# Patient Record
Sex: Female | Born: 1980
Health system: Southern US, Community
[De-identification: ages and names within clinical notes are randomized; demographics above are authoritative.]

## PROBLEM LIST (undated history)

## (undated) DIAGNOSIS — A449 Bartonellosis, unspecified: Secondary | ICD-10-CM

## (undated) DIAGNOSIS — B6000 Babesiosis, unspecified: Secondary | ICD-10-CM

## (undated) DIAGNOSIS — R760 Raised antibody titer: Secondary | ICD-10-CM

## (undated) DIAGNOSIS — E282 Polycystic ovarian syndrome: Secondary | ICD-10-CM

## (undated) DIAGNOSIS — A6920 Lyme disease, unspecified: Secondary | ICD-10-CM

## (undated) DIAGNOSIS — R002 Palpitations: Secondary | ICD-10-CM

## (undated) DIAGNOSIS — G35 Multiple sclerosis: Secondary | ICD-10-CM

## (undated) DIAGNOSIS — E785 Hyperlipidemia, unspecified: Secondary | ICD-10-CM

## (undated) DIAGNOSIS — Z124 Encounter for screening for malignant neoplasm of cervix: Secondary | ICD-10-CM

## (undated) DIAGNOSIS — Z87442 Personal history of urinary calculi: Secondary | ICD-10-CM

## (undated) DIAGNOSIS — N200 Calculus of kidney: Secondary | ICD-10-CM

## (undated) HISTORY — PX: LITHOTRIPSY: SUR834

## (undated) HISTORY — DX: Babesiosis, unspecified: B60.00

## (undated) HISTORY — DX: Raised antibody titer: R76.0

## (undated) HISTORY — DX: Encounter for screening for malignant neoplasm of cervix: Z12.4

## (undated) HISTORY — DX: Multiple sclerosis: G35

## (undated) HISTORY — DX: Bartonellosis, unspecified: A44.9

## (undated) HISTORY — PX: TONSILLECTOMY: SUR1361

## (undated) HISTORY — DX: Polycystic ovarian syndrome: E28.2

## (undated) HISTORY — DX: Palpitations: R00.2

## (undated) HISTORY — DX: Calculus of kidney: N20.0

## (undated) HISTORY — DX: Hyperlipidemia, unspecified: E78.5

---

## 2009-08-13 ENCOUNTER — Encounter: Payer: Self-pay | Admitting: Cardiovascular Disease

## 2009-08-13 LAB — CONVERTED CEMR LAB
ALT: 19 units/L
AST: 19 units/L
Chloride: 99 meq/L
Glucose, Bld: 83 mg/dL
HDL: 43 mg/dL
Hemoglobin: 14.8 g/dL
LDL (calc): 156 mg/dL
Lymphocytes, automated: 2.7 %
MCV: 87 fL
Monocytes Relative: 0.5 %
Potassium: 3.8 meq/L
RDW: 13 %
TSH: 1.79 microintl units/mL
Triglyceride fasting, serum: 126 mg/dL

## 2009-08-19 ENCOUNTER — Ambulatory Visit: Payer: Self-pay | Admitting: Cardiovascular Disease

## 2009-08-19 DIAGNOSIS — E785 Hyperlipidemia, unspecified: Secondary | ICD-10-CM

## 2009-08-19 DIAGNOSIS — R002 Palpitations: Secondary | ICD-10-CM

## 2009-08-19 DIAGNOSIS — R011 Cardiac murmur, unspecified: Secondary | ICD-10-CM | POA: Insufficient documentation

## 2009-08-19 DIAGNOSIS — E7849 Other hyperlipidemia: Secondary | ICD-10-CM | POA: Insufficient documentation

## 2009-08-19 DIAGNOSIS — R42 Dizziness and giddiness: Secondary | ICD-10-CM

## 2009-08-19 DIAGNOSIS — R519 Headache, unspecified: Secondary | ICD-10-CM | POA: Insufficient documentation

## 2009-08-19 DIAGNOSIS — R51 Headache: Secondary | ICD-10-CM | POA: Insufficient documentation

## 2009-08-20 ENCOUNTER — Encounter: Payer: Self-pay | Admitting: Cardiovascular Disease

## 2009-08-26 ENCOUNTER — Encounter: Payer: Self-pay | Admitting: Cardiovascular Disease

## 2009-08-28 ENCOUNTER — Telehealth: Payer: Self-pay | Admitting: Cardiovascular Disease

## 2009-09-22 ENCOUNTER — Ambulatory Visit: Payer: Self-pay | Admitting: Internal Medicine

## 2010-07-12 DIAGNOSIS — Z124 Encounter for screening for malignant neoplasm of cervix: Secondary | ICD-10-CM

## 2010-07-12 HISTORY — DX: Encounter for screening for malignant neoplasm of cervix: Z12.4

## 2010-08-02 LAB — HM PAP SMEAR: HM Pap smear: NORMAL

## 2010-08-11 NOTE — Progress Notes (Signed)
Summary: PHI  PHI   Imported By: Harlon Flor 08/26/2009 10:57:37  _____________________________________________________________________  External Attachment:    Type:   Image     Comment:   External Document

## 2010-08-11 NOTE — Assessment & Plan Note (Signed)
Summary: NEW PT; PALPITATIONS   Visit Type:  new patient Primary Provider:  Ronna Polio, MD  CC:  palpitations, no sob, no chest pains, and no swelling.  History of Present Illness: [name] is a very pleasant 30 year old woman with a history of hyperlipidemia and recent history of palpitations and skipping who presents for new patient evaluation.  Ms. Febo states that over the past several weeks to months she has had episodes of extra beats, sometimes with fluttering and skipping in her chest. She was seen by Dr. Harl Bowie and had a EKG and echocardiogram at that time. Per her report, EKG was normal and the echocardiogram showed minimal mitral regurgitation. She was started on atenolol 25 mg the dose was increased to 50 mg daily. She took medications for approximately 2 weeks and then stopped the medications. she is uncertain if the medications helped.   Since she stopped taking the atenolol, she's had worsening fluttering, feeling of hiccups and palpitations. These occur at rest as well as with exercise. They are more at nighttime than during the daytime. Sometimes they happen for prolonged periods of time and are constant. She states that last week was a bad week for her fluttering.  Preventive Screening-Counseling & Management  Alcohol-Tobacco     Alcohol type: wine     Smoking Status: never  Caffeine-Diet-Exercise     Caffeine use/day: 1-2 cups per day     Does Patient Exercise: yes     Type of exercise: cardio     Times/week: 3-5 days a week  Current Problems (verified): 1)  Dizziness, Chronic  (ICD-780.4) 2)  Headache  (ICD-784.0) 3)  Murmur  (ICD-785.2)  Current Medications (verified): 1)  None  Allergies (verified): No Known Drug Allergies  Past History:  Family History: Last updated: 08/19/2009 Father: Living and healthy Mother: Living and healthy Siblings: Living and healthy  Maternal grandmother- heart problems has had bypass surgery  Social  History: 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  Alcohol Use - yes socially maybe 3 glasses of wine a month   Risk Factors: Caffeine Use: 1-2 cups per day (08/19/2009) Exercise: yes (08/19/2009)  Risk Factors: Smoking Status: never (08/19/2009)  Past Surgical History: C- Section 2008  Family History: Father: Living and healthy Mother: Living and healthy Siblings: Living and healthy  Maternal grandmother- heart problems has had bypass surgery  Social History: Full Time- RN Single  Tobacco Use - No.  Alcohol Use - yes socially maybe 3 glasses of wine a month  Smoking Status:  never Caffeine use/day:  1-2 cups per day Does Patient Exercise:  yes  Review of Systems  The patient denies anorexia, fever, weight loss, weight gain, vision loss, decreased hearing, hoarseness, chest pain, syncope, dyspnea on exertion, peripheral edema, prolonged cough, headaches, hemoptysis, abdominal pain, melena, hematochezia, severe indigestion/heartburn, hematuria, incontinence, genital sores, muscle weakness, suspicious skin lesions, transient blindness, difficulty walking, depression, unusual weight change, abnormal bleeding, enlarged lymph nodes, angioedema, breast masses, and testicular masses.         Palpitations, fluttering  Vital Signs:  Patient profile:   30 year old female Height:      65 inches Weight:      170.75 pounds BMI:     28.52 Pulse rate:   82 / minute Pulse rhythm:   regular BP sitting:   124 / 80  (left arm) Cuff size:   regular  Vitals Entered By: Mercer Pod (August 19, 2009 9:51  AM)  Physical Exam  General:  well-appearing woman in no apparent distress. H. EENT exam is benign, oropharynx is clear. Neck is supple with no JVP, no carotid bruit. Heart sounds are regular with S1-S2 and no murmurs appreciated. lungs are clear to auscultation with no wheezes or rales. Abdominal exam is benign. No significant lower extremity edema.  Pulses are equal and symmetrical in her upper and lower extremities. Skin is warm and dry. Neurologic exam is nonfocal.   Problems:  Medical Problems Added: 1)  Dx of Hyperlipidemia-mixed  (ICD-272.4) 2)  Dx of Palpitations  (ICD-785.1) 3)  Dx of Dizziness, Chronic  (ICD-780.4) 4)  Dx of Headache  (ICD-784.0) 5)  Dx of Murmur  (ICD-785.2)   EKG  Procedure date:  08/19/2009  Findings:      EKG shows normal sinus rhythm wit No significant ST or T wave changes.  rate of 82 beats per minute. Normal.  Impression & Recommendations:  Problem # 1:  PALPITATIONS (ICD-785.1) etiology of her palpitations is likely due to APCs or VPCs. Her ectopy is a rare and not on a continuous basis making other arrhythmias less likely. We did discuss with her that we could do a event monitor though I suspect this is ectopy. We have given her some samples of bystolic  5 mg daily that she can take on an as needed basis. I Have also given her a prescription for metoprolol tartrate 0.5 mg p.o. b.i.d. if she finds the bystolic too expensive.  Her updated medication list for this problem includes:    Metoprolol Tartrate 25 Mg Tabs (Metoprolol tartrate) .Marland Kitchen... Take one tablet by mouth twice a day and as needed for palpitations    Bystolic 5 Mg Tabs (Nebivolol hcl) .Marland Kitchen... 1 tab daily as needed for palpitations  Problem # 2:  HYPERLIPIDEMIA-MIXED (ICD-272.4) Ms. Ayllon has a family history of high cholesterol. We'll obtain her most recent cholesterol panel from Dr. Dan Humphreys. She will try to watch her diet and increase her exercise over the next several months with a recheck of her cholesterol is he notes indicate her total cholesterol is greater than 200.  Problem # 3:  MURMUR (ICD-785.2) I will try to obtain the echocardiogram from Dr. Harl Bowie for our records. Per report, she has mild mitral regurgitation. Her updated medication list for this problem includes:    Metoprolol Tartrate 25 Mg Tabs (Metoprolol tartrate)  .Marland Kitchen... Take one tablet by mouth twice a day and as needed for palpitations    Bystolic 5 Mg Tabs (Nebivolol hcl) .Marland Kitchen... 1 tab daily as needed for palpitations  Patient Instructions: 1)  Your physician recommends that you schedule a follow-up appointment in: 6 months 2)  Your physician has recommended you make the following change in your medication: metoprolol 25 mg twice a day, bystolic 5 mg as needed Prescriptions: METOPROLOL TARTRATE 25 MG TABS (METOPROLOL TARTRATE) Take one tablet by mouth twice a day and as needed for palpitations  #60 x 6   Entered by:   Charlena Cross, RN, BSN   Authorized by:   Dossie Arbour MD   Signed by:   Charlena Cross, RN, BSN on 08/19/2009   Method used:   Electronically to        [facility]. [address] [phone]* (retail)       [address] [place], [place]  [id number]       Ph: [phone]       Fax: [phone]  RxID:   [id number]

## 2010-08-11 NOTE — Assessment & Plan Note (Signed)
Summary: np6   Visit Type:  New EP  Referring Provider:  Mariah Milling Primary Provider:  Ronna Polio, MD  CC:  palpitations is intermentant-comes and goes.  No sob and no edema.. Not taking the medication that was RX to her. .  History of Present Illness: Shannon Davidson returns today for followup.  She is a 30 yo woman with a h/o palpitations and preserved LV function.  She states that her episodes began about 6 months ago.  She notes that they can come on at any time and are associated with dizziness.  No c/p, sob, or night sweats. No syncope.  No family h/o sudden death or other arrhythmia.  She notes that caffiene may contribute to her problems and she has tried to cut back.  Current Problems (verified): 1)  Hyperlipidemia-mixed  (ICD-272.4) 2)  Palpitations  (ICD-785.1) 3)  Dizziness, Chronic  (ICD-780.4) 4)  Headache  (ICD-784.0) 5)  Murmur  (ICD-785.2)  Current Medications (verified): 1)  Metoprolol Tartrate 25 Mg Tabs (Metoprolol Tartrate) .... Take One Tablet By Mouth Twice A Day and As Needed For Palpitations -Stopped 2)  Bystolic 5 Mg Tabs (Nebivolol Hcl) .Marland Kitchen.. 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  Alcohol Use - yes socially maybe 3 glasses of wine a month   Risk Factors: Smoking Status: never (08/19/2009)  Review of Systems       All systems reviewed and negative except as noted in the HPI.  Vital Signs:  Patient profile:   30 year old female Height:      65 inches Weight:       165.75 pounds BMI:     27.68 Pulse rate:   72 / minute Pulse rhythm:   regular BP sitting:   114 / 74  (left arm) Cuff size:   regular  Vitals Entered By: Mercer Pod (September 22, 2009 12:07 PM)  Physical Exam  General:  well-appearing woman in no apparent distress. H. EENT exam is benign, oropharynx is clear. Neck is supple with no JVP, no carotid bruit. Heart sounds are regular with S1-S2 and no murmurs appreciated. lungs are clear to auscultation with no wheezes or rales. Abdominal exam is benign. No significant lower extremity edema. Pulses are equal and symmetrical in her upper and lower extremities. Skin is warm and dry. Neurologic exam is nonfocal.   EKG  Procedure date:  08/16/2009  Findings:      Normal sinus rhythm with rate of:76.  PAC's noted.    Impression & Recommendations:  Problem # 1:  PALPITATIONS (ICD-785.1) Her symptoms appear to be for the most part due to PAC's.  She may also have PVC's. I have discussed the treatment options with the patient in detail and she will continue to try as needed beta blockers.  If this does not control her symptoms then I would consider a calcium channel blocker like verapamil or cardizem.  Another third line treatment would be flecainide.  Also, avoidance of caffiene and ETOH and assuring a good night of sleep are helpful in controlling her symptoms.  Finally the benign  nature have been outlined and reassured. Her updated medication list for this problem includes:    Metoprolol Tartrate 25 Mg Tabs (Metoprolol tartrate) .Marland Kitchen... Take one tablet by mouth twice a day and as needed for palpitations -stopped    Bystolic 5 Mg Tabs (Nebivolol hcl) .Marland Kitchen... 1 tab daily as needed for palpitations - stopped  Problem # 2:  HYPERLIPIDEMIA-MIXED (ICD-272.4) A low fat diet is recommended.

## 2010-08-11 NOTE — Progress Notes (Signed)
Summary: still having PVC's on beta blockers  Phone Note Call from Patient   Summary of Call: Pt started out taking bystolic and metroprolol separately for palps.  Did not feel that PRN was working.  Pt hassince  been taking bystolic and metoprolol daily.  Obtained 12 lead EKG at work while feeling irregular HR- showed freq. PVC's- will fax results tomorrow.  Medications have not helped- have only made her feel washed out and has no energy for normal daily activities (working out, etc).  Pts BP reported lower than usual but pt unable to give specific readings.  Stated that on bystolic, HR would run as low as 50's.   Initial call taken by: Charlena Cross, RN, BSN,  August 28, 2009 1:26 PM  Follow-up for Phone Call        per Dr. Mariah Milling- pt should be taking one beta blocker or the other, not both.  PVC's are benign but irritating.  Pt can set up appt wtih Dr. Graciela Husbands if symptoms become too irritating.   Follow-up by: Charlena Cross, RN, BSN,  August 29, 2009 9:28 AM  Additional Follow-up for Phone Call Additional follow up Details #1::        pt set up to see Dr. Ladona Ridgel March 14 Additional Follow-up by: Charlena Cross, RN, BSN,  August 29, 2009 3:11 PM

## 2010-08-20 ENCOUNTER — Inpatient Hospital Stay (INDEPENDENT_AMBULATORY_CARE_PROVIDER_SITE_OTHER)
Admission: RE | Admit: 2010-08-20 | Discharge: 2010-08-20 | Disposition: A | Discharge status: Home / Self Care | Payer: Commercial Managed Care - PPO | Source: Ambulatory Visit | Attending: Family Medicine | Admitting: Family Medicine

## 2010-08-20 DIAGNOSIS — J069 Acute upper respiratory infection, unspecified: Secondary | ICD-10-CM

## 2010-08-20 LAB — POCT RAPID STREP A (OFFICE): Streptococcus, Group A Screen (Direct): NEGATIVE

## 2010-11-27 ENCOUNTER — Other Ambulatory Visit: Payer: Self-pay | Admitting: Internal Medicine

## 2011-02-02 ENCOUNTER — Encounter: Payer: Self-pay | Admitting: Cardiovascular Disease

## 2011-04-07 ENCOUNTER — Encounter: Payer: Self-pay | Admitting: Internal Medicine

## 2011-04-07 ENCOUNTER — Ambulatory Visit (INDEPENDENT_AMBULATORY_CARE_PROVIDER_SITE_OTHER): Payer: Commercial Managed Care - PPO | Admitting: Internal Medicine

## 2011-04-07 VITALS — BP 130/83 | HR 96 | Temp 98.5°F | Resp 20 | Ht 65.0 in | Wt 167.0 lb

## 2011-04-07 DIAGNOSIS — M25519 Pain in unspecified shoulder: Secondary | ICD-10-CM

## 2011-04-07 DIAGNOSIS — M25512 Pain in left shoulder: Secondary | ICD-10-CM

## 2011-04-07 DIAGNOSIS — E663 Overweight: Secondary | ICD-10-CM

## 2011-04-07 MED ORDER — TRAMADOL HCL 50 MG PO TABS: 50.0000 mg | ORAL_TABLET | Freq: Four times a day (QID) | ORAL | Status: AC | PRN

## 2011-04-07 MED ORDER — PHENTERMINE HCL 37.5 MG PO CAPS: 37.5000 mg | ORAL_CAPSULE | ORAL | Status: DC

## 2011-04-07 NOTE — Progress Notes (Signed)
Subjective:    Patient ID: Shannon Davidson, female    DOB: 1981-04-04, 30 y.o.   MRN: [id number]  HPI Shannon Davidson is a 30 year old female who presents for followup. She reports that she was recently evaluated by her OB/GYN who diagnosed her with PCO S. She reports her physician sent her for glucose testing which show glucose intolerance. Her physician put her on phentermine in an effort to help her lose weight. She reports she has been taking this for approximately 3 months. She's had no known side effects from this medication. She reports an almost 10 pound weight loss using the medication. She has been monitoring her diet closely but has not been counting calories. She has been trying to increase her physical activity.  She also complains of left shoulder pain. She reports that this left shoulder and scapular pain has been present for several months. She is currently under the care of a chiropractor who has been performing ultrasound therapy with some improvement in symptoms. She takes ibuprofen as needed for pain but feels that this is not adequate.  Outpatient Encounter Prescriptions as of 04/07/2011  Medication Sig Dispense Refill  . phentermine (ADIPEX-P) 37.5 MG capsule Take 1 capsule (37.5 mg total) by mouth every morning.  30 capsule  3    Review of Systems  Constitutional: Negative for fever, chills, appetite change, fatigue and unexpected weight change.  HENT: Negative for ear pain, congestion, sore throat, trouble swallowing, neck pain, voice change and sinus pressure.   Eyes: Negative for visual disturbance.  Respiratory: Negative for cough, shortness of breath, wheezing and stridor.   Cardiovascular: Negative for chest pain, palpitations and leg swelling.  Gastrointestinal: Negative for nausea, vomiting, abdominal pain, diarrhea, constipation, blood in stool, abdominal distention and anal bleeding.  Genitourinary: Negative for dysuria and flank pain.  Musculoskeletal: Positive for  myalgias and arthralgias. Negative for gait problem.  Skin: Negative for color change and rash.  Neurological: Negative for dizziness and headaches.  Hematological: Negative for adenopathy. Does not bruise/bleed easily.  Psychiatric/Behavioral: Negative for suicidal ideas, sleep disturbance and dysphoric mood. The patient is not nervous/anxious.    BP 130/83  Pulse 96  Temp(Src) 98.5 F (36.9 C) (Oral)  Resp 20  Ht 5\' 5"  (1.651 m)  Wt 167 lb (75.751 kg)  BMI 27.79 kg/m2  SpO2 97%  LMP 02/10/2011     Objective:   Physical Exam  Constitutional: She is oriented to person, place, and time. She appears well-developed and well-nourished. No distress.  HENT:  Head: Normocephalic and atraumatic.  Right Ear: External ear normal.  Left Ear: External ear normal.  Nose: Nose normal.  Mouth/Throat: Oropharynx is clear and moist. No oropharyngeal exudate.  Eyes: Conjunctivae are normal. Pupils are equal, round, and reactive to light. Right eye exhibits no discharge. Left eye exhibits no discharge. No scleral icterus.  Neck: Normal range of motion. Neck supple. No tracheal deviation present. No thyromegaly present.  Cardiovascular: Normal rate, regular rhythm, normal heart sounds and intact distal pulses.  Exam reveals no gallop and no friction rub.   No murmur heard. Pulmonary/Chest: Effort normal and breath sounds normal. No respiratory distress. She has no wheezes. She has no rales. She exhibits no tenderness.  Musculoskeletal: Normal range of motion. She exhibits no edema and no tenderness.       Left shoulder: She exhibits tenderness and pain. She exhibits normal strength.  Lymphadenopathy:    She has no cervical adenopathy.  Neurological: She is alert and oriented to  person, place, and time. No cranial nerve deficit. She exhibits normal muscle tone. Coordination normal.  Skin: Skin is warm and dry. No rash noted. She is not diaphoretic. No erythema. No pallor.  Psychiatric: She has a  normal mood and affect. Her behavior is normal. Judgment and thought content normal.          Assessment & Plan:  1. Overweight - patient is overweight with BMI of 27. This is an improvement with a 10 pound weight loss. She appears to be doing quite well using phentermine. We discussed the use of a food diary, however she feels this would be overwhelming given her other responsibilities at this time. Encouraged her to try to select healthy foods, specifically whole foods such as fruits and vegetables and avoid processed foods to help lower her caloric intake. We will plan to continue the phentermine for another one to 3 months given that she has done so well on this medication. She will followup in one month. Our goal be for her to lose 1-2 pounds per week.  2. Left shoulder pain - patient of left shoulder and scapular pain. She is currently followed by a chiropractor who is performing ultrasound therapy with some benefit. She will plan to continue with this. We have added tramadol for her to use for severe pain as needed.

## 2011-04-07 NOTE — Patient Instructions (Signed)
Calorie Counting Diet A calorie counting diet requires you to eat the number of calories that are right for you during a day. Calories are the measurement of how much energy you get from the food you eat. Eating the right amount of calories is important for staying at a healthy weight. If you eat too many calories your body will store them as fat and you may gain weight. If you eat too few calories you may lose weight. Counting the number of calories that you eat during a day will help you to know if you're eating the right amount. A Registered Dietitian can determine how many calories you need in a day. The amount of calories you need varies from person to person. If your goal is to lose weight you will need to eat fewer calories. Losing weight can benefit you if you are overweight or have health problems such as heart disease, high blood pressure or diabetes. If your goal is to gain weight, you will need to eat more calories. Gaining weight may be necessary if you have a certain health problem that causes your body to need more energy. TIPS Whether you are increasing or decreasing the number of calories you eat during a day, it may be hard to get used to changing what you eat and drink. The following are tips to help you keep track of the number of calories you are eating.  Measuring foods at home with measuring cups will help you to know the actual amount of food and number of calories you are eating.   Restaurants serve food in all different portion sizes. It is common that restaurants will serve food in amounts worth 2 or more serving sizes. While eating out, it may be helpful to estimate how many servings of a food you are given. For example, a serving of cooked rice is 1/2 cup and that is the size of half of a fist. Knowing serving sizes will help you have a better idea of how much food you are eating at restaurants.   Ask for smaller portion sizes or child-size portions at restaurants.   Plan to  eat half of a meal at a restaurant and take the rest home or share the other half with a friend   Read food labels for calorie content and serving size   Most packaged food has a Nutrition Facts Panel on its side or back. Here you can find out how many servings are in a package, the size of a serving, and the number of calories each serving has.   The serving size and number of servings per container are listed right below the Nutrition Facts heading. Just below the serving information, the number of calories in each serving is listed.   For example, say that a package has three cookies inside. The Nutrition Facts panel says that one serving is one cookie. Below that, it says that there are three servings in the container. The calories section of the Nutrition Facts says there are 90 calories. That means that there are 90 calories in one cookie. If you eat one cookie you have eaten 90 calories. If you eat all three cookies, you have eaten three times that amount, or 270 calories.  The list below tells you how big or small some common portion sizes are.  1 ounce (oz).................4 stacked dice.   3 oz..............................Deck of cards.   1 teaspoon (tsp)...........Tip of little finger.   1 tablespoon (Tbsp)....Tip of thumb.     2 Tbsp..........................Golf ball.    Cup..........................Half of a fist.   1 Cup...........................A fist.  KEEP A FOOD LOG Write down every food item that you eat, how much of the food you eat, and the number of calories in each food that you eat during the day. At the end of the day or throughout the day you can add up the total number of calories you have eaten.  It may help to set up a list like the one below. Find out the calorie information by reading food labels.  Breakfast   Bran Flakes (1 cup, 110 calories).   Fat free milk ( cup, 45 calories).   Snack   Apple (1 medium, 80 calories).   Lunch   Spinach (1  cup, 20 calories).   Tomato ( medium, 20 calories).   Chicken breast strips (3 oz, 165 calories).   Shredded cheddar cheese ( cup, 110 calories).   Light Italian dressing (2 Tbsp, 60 calories).   Whole wheat bread (1 slice, 80 calories).   Tub margarine (1 tsp, 35 calories).   Vegetable soup (1 cup, 160 calories).   Dinner   Pork chop (3 oz, 190 calories).   Brown rice (1 cup, 215 calories).   Steamed broccoli ( cup, 20 calories).   Strawberries (1  cup, 65 calories).   Whipped cream (1 Tbsp, 50 calories).  Daily Calorie Total: 1425 Information from www.eatright.org, Foodwise Nutritional Analysis Database. Document Released: 06/28/2005 Document Re-Released: 07/20/2009 [employer] Patient Information 2011 [employer]. 

## 2011-05-07 ENCOUNTER — Encounter: Payer: Self-pay | Admitting: Internal Medicine

## 2011-05-07 ENCOUNTER — Ambulatory Visit (INDEPENDENT_AMBULATORY_CARE_PROVIDER_SITE_OTHER): Payer: Commercial Managed Care - PPO | Admitting: Internal Medicine

## 2011-05-07 VITALS — BP 107/74 | HR 86 | Temp 98.6°F | Resp 20 | Ht 65.0 in | Wt 164.8 lb

## 2011-05-07 DIAGNOSIS — E663 Overweight: Secondary | ICD-10-CM

## 2011-05-07 DIAGNOSIS — M25519 Pain in unspecified shoulder: Secondary | ICD-10-CM

## 2011-05-07 DIAGNOSIS — M25512 Pain in left shoulder: Secondary | ICD-10-CM

## 2011-05-07 MED ORDER — PHENTERMINE HCL 37.5 MG PO TABS: 37.5000 mg | ORAL_TABLET | Freq: Every day | ORAL | Status: DC

## 2011-05-07 MED ORDER — CYCLOBENZAPRINE HCL 5 MG PO TABS: 5.0000 mg | ORAL_TABLET | Freq: Three times a day (TID) | ORAL | Status: DC | PRN

## 2011-05-07 NOTE — Patient Instructions (Signed)
Start Flexeril as needed for pain. Follow up in 1 month.

## 2011-05-07 NOTE — Progress Notes (Signed)
Subjective:    Patient ID: Shannon Davidson, female    DOB: [birth year], 30 y.o.   MRN: [id number]  HPI 30 year old female presents for followup. She has 2 concerns today. First she was started on phentermine at her last visit to help with appetite suppression and weight loss. She has lost 3 pounds since her last visit. She reports improvement in her diet with healthier food choices. She also notes that she has increased her physical activity and is participating in a running program with the goal of running a 5K race next month. She denies any problems with the phentermine. She denies any palpitations, chest pain, or agitation.  She continues to have left shoulder and upper back pain, which has been persistent for several months. She has been followed by a chiropractor and undergone massage therapy. She has had minimal improvement with therapy. Her chiropractor has discussed referral to a pain clinic. He also had discussed trying a course of muscle relaxers. She is interested in trying a muscle relaxer to help with pain. She denies any weakness in her left arm or numbness in her left arm. She has been using tramadol and nonsteroidals with no improvement.  Outpatient Encounter Prescriptions as of 05/07/2011  Medication Sig Dispense Refill  . TRAMADOL HCL PO Take 1 tablet by mouth as needed.        Marland Kitchen DISCONTD: phentermine (ADIPEX-P) 37.5 MG capsule Take 1 capsule (37.5 mg total) by mouth every morning.  30 capsule  3  . cyclobenzaprine (FLEXERIL) 5 MG tablet Take 1 tablet (5 mg total) by mouth every 8 (eight) hours as needed for muscle spasms. May increase to 2 tablets as needed.  30 tablet  1  . phentermine (ADIPEX-P) 37.5 MG tablet Take 1 tablet (37.5 mg total) by mouth daily before breakfast.  30 tablet  2    Review of Systems  Constitutional: Negative for fever, chills, appetite change, fatigue and unexpected weight change.  HENT: Negative for ear pain and neck pain.   Eyes: Negative for visual  disturbance.  Respiratory: Negative for cough and shortness of breath.   Cardiovascular: Negative for chest pain and palpitations.  Musculoskeletal: Positive for myalgias, back pain and arthralgias. Negative for gait problem.  Skin: Negative for color change and rash.  Neurological: Negative for dizziness and headaches.  Hematological: Negative for adenopathy. Does not bruise/bleed easily.  Psychiatric/Behavioral: Negative for suicidal ideas, sleep disturbance and dysphoric mood. The patient is not nervous/anxious.    BP 107/74  Pulse 86  Temp(Src) 98.6 F (37 C) (Oral)  Resp 20  Ht 5\' 5"  (1.651 m)  Wt 164 lb 12 oz (74.73 kg)  BMI 27.42 kg/m2  SpO2 97%     Objective:   Physical Exam  Constitutional: She is oriented to person, place, and time. She appears well-developed and well-nourished. No distress.  HENT:  Head: Normocephalic and atraumatic.  Right Ear: External ear normal.  Left Ear: External ear normal.  Nose: Nose normal.  Mouth/Throat: Oropharynx is clear and moist. No oropharyngeal exudate.  Eyes: Conjunctivae are normal. Pupils are equal, round, and reactive to light. Right eye exhibits no discharge. Left eye exhibits no discharge. No scleral icterus.  Neck: Normal range of motion. Neck supple. No tracheal deviation present. No thyromegaly present.  Cardiovascular: Normal rate, regular rhythm, normal heart sounds and intact distal pulses.  Exam reveals no gallop and no friction rub.   No murmur heard. Pulmonary/Chest: Effort normal and breath sounds normal. No respiratory distress. She has no  wheezes. She has no rales.   She exhibits no tenderness.  Musculoskeletal: Normal range of motion. She exhibits no edema and no tenderness.       Left shoulder: She exhibits tenderness and pain. She exhibits normal range of motion, no swelling and normal strength.  Lymphadenopathy:    She has no cervical adenopathy.  Neurological: She is alert and oriented to person, place, and  time. No cranial nerve deficit. She exhibits normal muscle tone. Coordination normal.  Skin: Skin is warm and dry. No rash noted. She is not diaphoretic. No erythema. No pallor.  Psychiatric: She has a normal mood and affect. Her behavior is normal. Judgment and thought content normal.          Assessment & Plan:  1. Left shoulder/upper back pain - Will try course of flexeril to see if any improvement.  She will use this only at night given its potential to make her drowsy.  She will continue with therapy through her chiropractor, Dr. Alfredo Bach.  She will follow up or call if   2. Overweight -Congratulated pt on 3lb weight loss over the last month.  Encouraged her to continue with healthy diet and regular exercise including her running program. She will follow up in 1 month for BP and weight check. We had discussed checking HgbA1c in the past given concern about diabetes, and will check today.

## 2011-05-14 ENCOUNTER — Ambulatory Visit (INDEPENDENT_AMBULATORY_CARE_PROVIDER_SITE_OTHER): Payer: 59 | Admitting: *Deleted

## 2011-05-14 ENCOUNTER — Other Ambulatory Visit: Payer: Self-pay

## 2011-05-14 DIAGNOSIS — Z131 Encounter for screening for diabetes mellitus: Secondary | ICD-10-CM

## 2011-06-08 ENCOUNTER — Ambulatory Visit (INDEPENDENT_AMBULATORY_CARE_PROVIDER_SITE_OTHER): Payer: 59 | Admitting: Internal Medicine

## 2011-06-08 ENCOUNTER — Encounter: Payer: Self-pay | Admitting: Internal Medicine

## 2011-06-08 VITALS — BP 136/80 | HR 101 | Temp 98.4°F | Wt 164.0 lb

## 2011-06-08 DIAGNOSIS — M549 Dorsalgia, unspecified: Secondary | ICD-10-CM | POA: Insufficient documentation

## 2011-06-08 DIAGNOSIS — Z6825 Body mass index (BMI) 25.0-25.9, adult: Secondary | ICD-10-CM

## 2011-06-08 DIAGNOSIS — E663 Overweight: Secondary | ICD-10-CM | POA: Insufficient documentation

## 2011-06-08 MED ORDER — GABAPENTIN 100 MG PO CAPS: 100.0000 mg | ORAL_CAPSULE | Freq: Three times a day (TID) | ORAL | Status: DC

## 2011-06-08 NOTE — Progress Notes (Signed)
Subjective:    Patient ID: Shannon Davidson, female    DOB: 1981-06-18, 30 y.o.   MRN: [id number]  HPI 30YO female presents to follow up on weight loss and chronic left upper back pain. In regards to weight loss, pt notes a plateau over last few weeks. Notes some dietary indiscretion with Thanksgiving Holiday.  No palpitations, agitation, or elevated BP (pt checks BP at work) noted.  In regards to left upper back pain, pt notes no improvement with use of flexeril. Continues to have burning pain that is worse when active and moving left arm and worse when lying down at night. Has tried NSAIDs, chiropractic care with no improvement.  Would like to consider steroid injection. Denies weakness in left arm or left shoulder.  Outpatient Encounter Prescriptions as of 06/08/2011  Medication Sig Dispense Refill  . phentermine 37.5 MG capsule Take 37.5 mg by mouth every morning.        Marland Kitchen DISCONTD: cyclobenzaprine (FLEXERIL) 5 MG tablet Take 1 tablet (5 mg total) by mouth every 8 (eight) hours as needed for muscle spasms. May increase to 2 tablets as needed.  30 tablet  1  . gabapentin (NEURONTIN) 100 MG capsule Take 1 capsule (100 mg total) by mouth 3 (three) times daily.  90 capsule  2  . DISCONTD: TRAMADOL HCL PO Take 1 tablet by mouth as needed.          Review of Systems  Constitutional: Negative for fever, chills, appetite change, fatigue and unexpected weight change.  Eyes: Negative for visual disturbance.  Respiratory: Negative for cough and shortness of breath.   Cardiovascular: Negative for chest pain, palpitations and leg swelling.  Genitourinary: Negative for dysuria and flank pain.  Musculoskeletal: Positive for myalgias, back pain and arthralgias. Negative for gait problem.  Skin: Negative for color change.  Neurological: Negative for dizziness and headaches.  Hematological: Negative for adenopathy. Does not bruise/bleed easily.  Psychiatric/Behavioral: Negative for dysphoric mood. The  patient is not nervous/anxious.    BP 136/80  Pulse 101  Temp(Src) 98.4 F (36.9 C) (Oral)  Wt 164 lb (74.39 kg)  SpO2 97%     Objective:   Physical Exam  Constitutional: She is oriented to person, place, and time. She appears well-developed and well-nourished. No distress.  HENT:  Head: Normocephalic and atraumatic.  Right Ear: External ear normal.  Left Ear: External ear normal.  Nose: Nose normal.  Mouth/Throat: Oropharynx is clear and moist. No oropharyngeal exudate.  Eyes: Conjunctivae are normal. Pupils are equal, round, and reactive to light. Right eye exhibits no discharge. Left eye exhibits no discharge. No scleral icterus.  Neck: Normal range of motion. Neck supple. No tracheal deviation present. No thyromegaly present.  Cardiovascular: Normal rate, regular rhythm, normal heart sounds and intact distal pulses.  Exam reveals no gallop and no friction rub.   No murmur heard. Pulmonary/Chest: Effort normal and breath sounds normal. No respiratory distress. She has no wheezes. She has no rales. She exhibits no tenderness.  Musculoskeletal: Normal range of motion. She exhibits no edema and no tenderness.       Arms: Lymphadenopathy:    She has no cervical adenopathy.  Neurological: She is alert and oriented to person, place, and time. No cranial nerve deficit. She exhibits normal muscle tone. Coordination normal.  Skin: Skin is warm and dry. No rash noted. She is not diaphoretic. No erythema. No pallor.  Psychiatric: She has a normal mood and affect. Her behavior is normal. Judgment and thought  content normal.          Assessment & Plan:  1. Left upper back pain - Pt has failed conservative therapy with NSAIDS, muscle relaxants and chiropractic care. Question if she would benefit from local steroid injection. Will set her up at the pain clinic for evaluation. In the interim, will start Neurontin 100mg  at nighttime, with plan to advance to tid as tolerated for pain. Follow  up 3 months.  2. Overweight - No change in weight over last month. Pt continues to use phentermine, notes some dietary indescretion over Thanksgiving Holiday. Will continue to monitor weight. Pt will monitor BP at work. She will stop phentermine if BP >140/90. Goal weight loss 1 lb per week to goal BMI 25.

## 2011-08-03 ENCOUNTER — Ambulatory Visit: Payer: Self-pay

## 2011-08-05 LAB — BETA STREP CULTURE(ARMC)

## 2011-08-06 ENCOUNTER — Ambulatory Visit: Payer: 59 | Admitting: Internal Medicine

## 2011-08-24 ENCOUNTER — Telehealth: Payer: Self-pay | Admitting: Internal Medicine

## 2011-08-24 NOTE — Telephone Encounter (Signed)
Patient is needing a note that she has had a physical for her insurance at work.

## 2011-08-25 NOTE — Telephone Encounter (Signed)
She wants to know if she has had a physical, if not she is willing to schedule b/c she is required to have one and proof given to her employer. I do not see a CPE in any of the OV's, do you?

## 2011-08-26 NOTE — Telephone Encounter (Signed)
I don't think we have done a physical.  She may have had PAP with OB? Fine to schedule

## 2011-08-26 NOTE — Telephone Encounter (Signed)
Can you please help inform pt that she has not had a physical and go ahead and schedule pt w/Dr Dan Humphreys? THANKS!

## 2011-08-30 NOTE — Telephone Encounter (Signed)
OV on 2/28 is 15 min. OK for this to be CPX? Or do you want [place] to reschedule?

## 2011-08-30 NOTE — Telephone Encounter (Signed)
Fine to leave it

## 2011-08-30 NOTE — Telephone Encounter (Signed)
Called bmp her last cpx was 08/14/09 she has a 3 month  follow up  On 09/09/11

## 2011-09-01 NOTE — Telephone Encounter (Signed)
I left pt Vm that she should keep upcoming apt and to call w/any questions. I have form that she faxed over that requires [place] to put date of physical

## 2011-09-09 ENCOUNTER — Ambulatory Visit (INDEPENDENT_AMBULATORY_CARE_PROVIDER_SITE_OTHER): Payer: 59 | Admitting: Internal Medicine

## 2011-09-09 ENCOUNTER — Encounter: Payer: Self-pay | Admitting: Internal Medicine

## 2011-09-09 DIAGNOSIS — E282 Polycystic ovarian syndrome: Secondary | ICD-10-CM | POA: Insufficient documentation

## 2011-09-09 DIAGNOSIS — Z0000 Encounter for general adult medical examination without abnormal findings: Secondary | ICD-10-CM | POA: Insufficient documentation

## 2011-09-09 NOTE — Assessment & Plan Note (Signed)
Will set up an endocrinology evaluation. Patient has had testing in the past including hemoglobin A1c, but question if she might benefit from additional testing including 2 hour glucose challenge, and testing for CAH.

## 2011-09-09 NOTE — Assessment & Plan Note (Signed)
Exam is normal today. She has scheduled followup with her OB/GYN for Pap smear and breast exam. Will obtain vaccination record from [facility] health. We'll plan to recheck lipids with labs, however will defer until she sees endocrinologist to get all labs in one setting. Followup in one year.

## 2011-09-09 NOTE — Progress Notes (Signed)
Subjective:    Patient ID: Shannon Davidson, female    DOB: March 07, 1981, 31 y.o.   MRN: [id number]  HPI 31 year old female presents for her annual exam. She reports that she is generally feeling well. She notes that she recently read a book about PCO S., and she would like to pursue further evaluation for her PCO S. She reports she was diagnosed with this in the past, but has never seen an endocrinologist for a more thorough evaluation. She continues to have irregular periods. She occasionally takes progesterone 2 stimulate menses. She last used progesterone the last month, but reports only minimal spotting after using this medication. She denies any abdominal pain or distention. Given her history of PCO S., she is also concerned about the risk of diabetes. In the past, she had hemoglobin A1c which was normal at 5.4%. However, she questions whether the use of metformin might be helpful to prevent diabetes in the future.  Aside from her concerns about PCO S., she reports that she is feeling well. She is very active and has recently changed jobs.  In regards to health maintenance, she notes that she had a Pap smear last year with her OB/GYN and is scheduled to see her OB/GYN again this year for both Pap smear and breast exam. She is up-to-date on her vaccinations. She had lab work including cholesterol testing last year.  Outpatient Encounter Prescriptions as of 09/09/2011  Medication Sig Dispense Refill  . phentermine 37.5 MG capsule Take 37.5 mg by mouth every morning.        Marland Kitchen DISCONTD: gabapentin (NEURONTIN) 100 MG capsule Take 1 capsule (100 mg total) by mouth 3 (three) times daily.  90 capsule  2    Review of Systems  Constitutional: Negative for fever, chills, appetite change, fatigue and unexpected weight change.  HENT: Negative for ear pain, congestion, sore throat, trouble swallowing, neck pain, voice change and sinus pressure.   Eyes: Negative for visual disturbance.  Respiratory: Negative  for cough, shortness of breath, wheezing and stridor.   Cardiovascular: Negative for chest pain, palpitations and leg swelling.  Gastrointestinal: Negative for nausea, vomiting, abdominal pain, diarrhea, constipation, blood in stool, abdominal distention and anal bleeding.  Genitourinary: Positive for menstrual problem. Negative for dysuria and flank pain.  Musculoskeletal: Negative for myalgias, arthralgias and gait problem.  Skin: Negative for color change and rash.  Neurological: Negative for dizziness and headaches.  Hematological: Negative for adenopathy. Does not bruise/bleed easily.  Psychiatric/Behavioral: Negative for suicidal ideas, sleep disturbance and dysphoric mood. The patient is not nervous/anxious.    BP 112/68  Pulse 99  Temp(Src) 98.7 F (37.1 C) (Oral)  Ht 5' 5.5" (1.664 m)  Wt 161 lb (73.029 kg)  BMI 26.38 kg/m2  SpO2 100%  LMP 07/09/2011     Objective:   Physical Exam  Constitutional: She is oriented to person, place, and time. She appears well-developed and well-nourished. No distress.  HENT:  Head: Normocephalic and atraumatic.  Right Ear: External ear normal.  Left Ear: External ear normal.  Nose: Nose normal.  Mouth/Throat: Oropharynx is clear and moist. No oropharyngeal exudate.  Eyes: Conjunctivae are normal. Pupils are equal, round, and reactive to light. Right eye exhibits no discharge. Left eye exhibits no discharge. No scleral icterus.  Neck: Normal range of motion. Neck supple. No tracheal deviation present. No thyromegaly present.  Cardiovascular: Normal rate, regular rhythm, normal heart sounds and intact distal pulses.  Exam reveals no gallop and no friction rub.   No  murmur heard. Pulmonary/Chest: Effort normal and breath sounds normal. No respiratory distress. She has no wheezes. She has no rales. She exhibits no tenderness.  Abdominal: Soft. Bowel sounds are normal. She exhibits no distension and no mass. There is no tenderness. There is no  rebound and no guarding.  Musculoskeletal: Normal range of motion. She exhibits no edema and no tenderness.  Lymphadenopathy:    She has no cervical adenopathy.  Neurological: She is alert and oriented to person, place, and time. No cranial nerve deficit. She exhibits normal muscle tone. Coordination normal.  Skin: Skin is warm and dry. No rash noted. She is not diaphoretic. No erythema. No pallor.  Psychiatric: She has a normal mood and affect. Her behavior is normal. Judgment and thought content normal.          Assessment & Plan:

## 2012-05-16 ENCOUNTER — Telehealth: Payer: Self-pay

## 2012-05-16 NOTE — Telephone Encounter (Signed)
I don't see that she has used xanax while a patient at our current office. I think that she used it when we were at our previous office. I will call in a short supply but she will need follow up appointment in the next 2 weeks. Alprazolam 0.25mg  po tid prn #20 no refill.

## 2012-05-16 NOTE — Telephone Encounter (Signed)
Pt is needing refill on Xanax. She is going through a bad custody battle and needs something for her nerves. She uses [facility] on [address].

## 2012-05-18 ENCOUNTER — Telehealth: Payer: Self-pay

## 2012-05-18 NOTE — Telephone Encounter (Signed)
That is fine 

## 2012-05-18 NOTE — Telephone Encounter (Signed)
Pt called and was scheduled in early December for her CPE but needs a form for work filled out before 06/10/12. You had room on 11/2//13 at 2:30pm I was wondering if this would be ok she is a [employer].

## 2012-05-19 MED ORDER — ALPRAZOLAM 0.25 MG PO TABS: 0.2500 mg | ORAL_TABLET | Freq: Three times a day (TID) | ORAL | Status: DC

## 2012-05-19 NOTE — Telephone Encounter (Signed)
Left message on cell phone voicemail for patient to return call. 

## 2012-05-19 NOTE — Telephone Encounter (Signed)
Patient advised as instructed via telephone, she has an appt with Dr. Dan Humphreys on 06/02/2012.  Rx for Alprazolam called to [facility].

## 2012-06-02 ENCOUNTER — Encounter: Payer: Self-pay | Admitting: Internal Medicine

## 2012-06-02 ENCOUNTER — Ambulatory Visit (INDEPENDENT_AMBULATORY_CARE_PROVIDER_SITE_OTHER): Payer: Self-pay | Admitting: Internal Medicine

## 2012-06-02 VITALS — BP 118/78 | HR 76 | Temp 98.9°F | Resp 16 | Ht 65.5 in | Wt 157.2 lb

## 2012-06-02 DIAGNOSIS — Z0000 Encounter for general adult medical examination without abnormal findings: Secondary | ICD-10-CM | POA: Insufficient documentation

## 2012-06-02 NOTE — Progress Notes (Signed)
  Subjective:    Patient ID: Shannon Davidson, female    DOB: 11-17-1980, 31 y.o.   MRN: [id number]  HPI 31YO female presents for annual exam. Doing well. No concerns today. Follows healthy diet and gets regular physical activity.  UTD on health maintenance including PAP, breast exam, immunizations.  Outpatient Encounter Prescriptions as of 06/02/2012  Medication Sig Dispense Refill  . ALPRAZolam (XANAX) 0.25 MG tablet Take 1 tablet (0.25 mg total) by mouth 3 (three) times daily.  20 tablet  0  . valACYclovir (VALTREX) 500 MG tablet        BP 118/78  Pulse 76  Temp 98.9 F (37.2 C) (Oral)  Resp 16  Ht 5' 5.5" (1.664 m)  Wt 157 lb 4 oz (71.328 kg)  BMI 25.77 kg/m2  LMP 05/19/2012  Review of Systems  Constitutional: Negative for fever, chills, appetite change, fatigue and unexpected weight change.  HENT: Negative for ear pain, congestion, sore throat, trouble swallowing, neck pain, voice change and sinus pressure.   Eyes: Negative for visual disturbance.  Respiratory: Negative for cough, shortness of breath, wheezing and stridor.   Cardiovascular: Negative for chest pain, palpitations and leg swelling.  Gastrointestinal: Negative for nausea, vomiting, abdominal pain, diarrhea, constipation, blood in stool, abdominal distention and anal bleeding.  Genitourinary: Negative for dysuria and flank pain.  Musculoskeletal: Negative for myalgias, arthralgias and gait problem.  Skin: Negative for color change and rash.  Neurological: Negative for dizziness and headaches.  Hematological: Negative for adenopathy. Does not bruise/bleed easily.  Psychiatric/Behavioral: Negative for suicidal ideas, sleep disturbance and dysphoric mood. The patient is not nervous/anxious.        Objective:   Physical Exam  Constitutional: She is oriented to person, place, and time. She appears well-developed and well-nourished. No distress.  HENT:  Head: Normocephalic and atraumatic.  Right Ear: External ear  normal.  Left Ear: External ear normal.  Nose: Nose normal.  Mouth/Throat: Oropharynx is clear and moist. No oropharyngeal exudate.  Eyes: Conjunctivae normal are normal. Pupils are equal, round, and reactive to light. Right eye exhibits no discharge. Left eye exhibits no discharge. No scleral icterus.  Neck: Normal range of motion. Neck supple. No tracheal deviation present. No thyromegaly present.  Cardiovascular: Normal rate, regular rhythm, normal heart sounds and intact distal pulses.  Exam reveals no gallop and no friction rub.   No murmur heard. Pulmonary/Chest: Effort normal and breath sounds normal. No respiratory distress. She has no wheezes. She has no rales. She exhibits no tenderness.  Abdominal: Soft. Bowel sounds are normal. She exhibits no distension and no mass. There is no tenderness. There is no rebound and no guarding.  Musculoskeletal: Normal range of motion. She exhibits no edema and no tenderness.  Lymphadenopathy:    She has no cervical adenopathy.  Neurological: She is alert and oriented to person, place, and time. No cranial nerve deficit. She exhibits normal muscle tone. Coordination normal.  Skin: Skin is warm and dry. No rash noted. She is not diaphoretic. No erythema. No pallor.  Psychiatric: She has a normal mood and affect. Her behavior is normal. Judgment and thought content normal.          Assessment & Plan:

## 2012-06-02 NOTE — Assessment & Plan Note (Signed)
General medical exam is normal today. PAP deferred as UTD 07/2010, will plan to repeat 07/2013.  Other health maintenance including vaccinations UTD.  Encouraged continued efforts at healthy diet and regular physical activity. Will plan to repeat basic labs through her work, at [facility]. She will fax over results.

## 2012-06-21 ENCOUNTER — Encounter: Payer: 59 | Admitting: Internal Medicine

## 2012-08-21 ENCOUNTER — Encounter: Payer: Self-pay | Admitting: Internal Medicine

## 2012-09-07 ENCOUNTER — Ambulatory Visit: Payer: BC Managed Care – PPO | Admitting: Internal Medicine

## 2012-09-07 ENCOUNTER — Ambulatory Visit (INDEPENDENT_AMBULATORY_CARE_PROVIDER_SITE_OTHER): Payer: BC Managed Care – PPO | Admitting: Internal Medicine

## 2012-09-07 ENCOUNTER — Encounter: Payer: Self-pay | Admitting: Internal Medicine

## 2012-09-07 VITALS — BP 120/88 | HR 62 | Temp 98.9°F | Wt 162.0 lb

## 2012-09-07 DIAGNOSIS — E663 Overweight: Secondary | ICD-10-CM

## 2012-09-07 DIAGNOSIS — Z6825 Body mass index (BMI) 25.0-25.9, adult: Secondary | ICD-10-CM

## 2012-09-07 MED ORDER — TOPIRAMATE 50 MG PO TABS: 50.0000 mg | ORAL_TABLET | Freq: Every day | ORAL | Status: DC

## 2012-09-07 NOTE — Assessment & Plan Note (Signed)
Wt Readings from Last 3 Encounters:  09/07/12 162 lb (73.483 kg)  06/02/12 157 lb 4 oz (71.328 kg)  09/09/11 161 lb (73.029 kg)   BMI 26. Pt is making significant efforts at diet and exercise. Will resume use of Topamax to help with appetite. Plan to use x 3 months maximum, end 11/2012. Discussed potential side effects and importance of using birth control while on this medication. Follow up 1 month and prn.

## 2012-09-07 NOTE — Progress Notes (Signed)
  Subjective:    Patient ID: Shannon Davidson, female    DOB: 1981-01-06, 32 y.o.   MRN: [id number]  HPI 31YO female with h/o being overweight presents for follow up. Concerned about difficulty losing weight, despite following healthy diet and exercising 4-5 times per week. Has tried phentermine for appetite suppression in the past, however had some palpitations with this med. Also used topamax and tolerated this well.  Outpatient Encounter Prescriptions as of 09/07/2012  Medication Sig Dispense Refill  . ALPRAZolam (XANAX) 0.25 MG tablet Take 1 tablet (0.25 mg total) by mouth 3 (three) times daily.  20 tablet  0  . valACYclovir (VALTREX) 500 MG tablet        No facility-administered encounter medications on file as of 09/07/2012.   BP 120/88  Pulse 62  Temp(Src) 98.9 F (37.2 C) (Oral)  Wt 162 lb (73.483 kg)  BMI 26.54 kg/m2  SpO2 97%  LMP 09/03/2012  Review of Systems  Constitutional: Negative for fever, chills, appetite change, fatigue and unexpected weight change.  HENT: Negative for ear pain, congestion, sore throat, trouble swallowing, neck pain, voice change and sinus pressure.   Eyes: Negative for visual disturbance.  Respiratory: Negative for cough, shortness of breath, wheezing and stridor.   Cardiovascular: Negative for chest pain, palpitations and leg swelling.  Gastrointestinal: Negative for nausea, vomiting, abdominal pain, diarrhea, constipation, blood in stool, abdominal distention and anal bleeding.  Genitourinary: Negative for dysuria and flank pain.  Musculoskeletal: Negative for myalgias, arthralgias and gait problem.  Skin: Negative for color change and rash.  Neurological: Negative for dizziness and headaches.  Hematological: Negative for adenopathy. Does not bruise/bleed easily.  Psychiatric/Behavioral: Negative for suicidal ideas, sleep disturbance and dysphoric mood. The patient is not nervous/anxious.        Objective:   Physical Exam  Constitutional: She is  oriented to person, place, and time. She appears well-developed and well-nourished. No distress.  HENT:  Head: Normocephalic and atraumatic.  Right Ear: External ear normal.  Left Ear: External ear normal.  Nose: Nose normal.  Mouth/Throat: Oropharynx is clear and moist. No oropharyngeal exudate.  Eyes: Conjunctivae are normal. Pupils are equal, round, and reactive to light. Right eye exhibits no discharge. Left eye exhibits no discharge. No scleral icterus.  Neck: Normal range of motion. Neck supple. No tracheal deviation present. No thyromegaly present.  Cardiovascular: Normal rate, regular rhythm, normal heart sounds and intact distal pulses.  Exam reveals no gallop and no friction rub.   No murmur heard. Pulmonary/Chest: Effort normal and breath sounds normal. No respiratory distress. She has no wheezes. She has no rales. She exhibits no tenderness.  Musculoskeletal: Normal range of motion. She exhibits no edema and no tenderness.  Lymphadenopathy:    She has no cervical adenopathy.  Neurological: She is alert and oriented to person, place, and time. No cranial nerve deficit. She exhibits normal muscle tone. Coordination normal.  Skin: Skin is warm and dry. No rash noted. She is not diaphoretic. No erythema. No pallor.  Psychiatric: She has a normal mood and affect. Her behavior is normal. Judgment and thought content normal.          Assessment & Plan:

## 2012-10-11 ENCOUNTER — Encounter: Payer: Self-pay | Admitting: Internal Medicine

## 2012-10-11 ENCOUNTER — Ambulatory Visit (INDEPENDENT_AMBULATORY_CARE_PROVIDER_SITE_OTHER): Payer: BC Managed Care – PPO | Admitting: Internal Medicine

## 2012-10-11 VITALS — BP 122/78 | HR 88 | Temp 98.4°F | Wt 156.0 lb

## 2012-10-11 DIAGNOSIS — Z6825 Body mass index (BMI) 25.0-25.9, adult: Secondary | ICD-10-CM

## 2012-10-11 DIAGNOSIS — E663 Overweight: Secondary | ICD-10-CM

## 2012-10-11 MED ORDER — TOPIRAMATE 50 MG PO TABS: 50.0000 mg | ORAL_TABLET | Freq: Every day | ORAL | Status: DC

## 2012-10-11 NOTE — Assessment & Plan Note (Signed)
Wt Readings from Last 3 Encounters:  10/11/12 156 lb (70.761 kg)  09/07/12 162 lb (73.483 kg)  06/02/12 157 lb 4 oz (71.328 kg)   Congratulated pt on 6lb weight loss. Encouraged her to continue efforts at healthy diet and regular physical activity. Discussed taper of Topamax to 25mg  daily and then stop in the next 1-2 months. Follow up 3 months and prn.

## 2012-10-11 NOTE — Progress Notes (Signed)
  Subjective:    Patient ID: Shannon Davidson, female    DOB: 06/08/1981, 32 y.o.   MRN: [id number]  HPI 31YO female with h/o being overweight presents for follow up. Using Topamax over last month to help with appetite suppression. Tolerating well with no noted side effects. Has increased exercise level and reduce calorie intake, resulting in 6lb weight loss. No new concerns today.  Outpatient Encounter Prescriptions as of 10/11/2012  Medication Sig Dispense Refill  . topiramate (TOPAMAX) 50 MG tablet Take 1 tablet (50 mg total) by mouth daily.  30 tablet  1  . ALPRAZolam (XANAX) 0.25 MG tablet Take 1 tablet (0.25 mg total) by mouth 3 (three) times daily.  20 tablet  0  . valACYclovir (VALTREX) 500 MG tablet        No facility-administered encounter medications on file as of 10/11/2012.   BP 122/78  Pulse 88  Temp(Src) 98.4 F (36.9 C) (Oral)  Wt 156 lb (70.761 kg)  BMI 25.56 kg/m2  SpO2 97%  LMP 10/05/2012  Review of Systems  Constitutional: Negative for fever, chills, appetite change, fatigue and unexpected weight change.  HENT: Negative for ear pain, congestion, sore throat, trouble swallowing, neck pain, voice change and sinus pressure.   Eyes: Negative for visual disturbance.  Respiratory: Negative for cough, shortness of breath, wheezing and stridor.   Cardiovascular: Negative for chest pain, palpitations and leg swelling.  Gastrointestinal: Negative for nausea, vomiting, abdominal pain, diarrhea, constipation, blood in stool, abdominal distention and anal bleeding.  Genitourinary: Negative for dysuria and flank pain.  Musculoskeletal: Negative for myalgias, arthralgias and gait problem.  Skin: Negative for color change and rash.  Neurological: Negative for dizziness and headaches.  Hematological: Negative for adenopathy. Does not bruise/bleed easily.  Psychiatric/Behavioral: Negative for suicidal ideas, sleep disturbance and dysphoric mood. The patient is not nervous/anxious.        Objective:   Physical Exam  Constitutional: She is oriented to person, place, and time. She appears well-developed and well-nourished. No distress.  HENT:  Head: Normocephalic and atraumatic.  Right Ear: External ear normal.  Left Ear: External ear normal.  Nose: Nose normal.  Mouth/Throat: Oropharynx is clear and moist. No oropharyngeal exudate.  Eyes: Conjunctivae are normal. Pupils are equal, round, and reactive to light. Right eye exhibits no discharge. Left eye exhibits no discharge. No scleral icterus.  Neck: Normal range of motion. Neck supple. No tracheal deviation present. No thyromegaly present.  Cardiovascular: Normal rate, regular rhythm, normal heart sounds and intact distal pulses.  Exam reveals no gallop and no friction rub.   No murmur heard. Pulmonary/Chest: Effort normal and breath sounds normal. No respiratory distress. She has no wheezes. She has no rales. She exhibits no tenderness.  Musculoskeletal: Normal range of motion. She exhibits no edema and no tenderness.  Lymphadenopathy:    She has no cervical adenopathy.  Neurological: She is alert and oriented to person, place, and time. No cranial nerve deficit. She exhibits normal muscle tone. Coordination normal.  Skin: Skin is warm and dry. No rash noted. She is not diaphoretic. No erythema. No pallor.  Psychiatric: She has a normal mood and affect. Her behavior is normal. Judgment and thought content normal.          Assessment & Plan:

## 2012-11-27 ENCOUNTER — Encounter: Payer: Self-pay | Admitting: Internal Medicine

## 2012-11-27 ENCOUNTER — Other Ambulatory Visit: Payer: Self-pay | Admitting: Internal Medicine

## 2012-11-27 MED ORDER — ALPRAZOLAM 0.25 MG PO TABS: 0.2500 mg | ORAL_TABLET | Freq: Three times a day (TID) | ORAL | Status: DC | PRN

## 2012-11-27 MED ORDER — CYCLOBENZAPRINE HCL 10 MG PO TABS: 10.0000 mg | ORAL_TABLET | Freq: Three times a day (TID) | ORAL | Status: DC | PRN

## 2012-12-18 ENCOUNTER — Encounter: Payer: Self-pay | Admitting: Internal Medicine

## 2012-12-20 ENCOUNTER — Ambulatory Visit: Payer: BC Managed Care – PPO | Admitting: Internal Medicine

## 2013-01-02 LAB — HM PAP SMEAR: HM Pap smear: NORMAL

## 2013-01-17 ENCOUNTER — Ambulatory Visit: Payer: BC Managed Care – PPO | Admitting: Internal Medicine

## 2013-02-21 ENCOUNTER — Ambulatory Visit: Payer: BC Managed Care – PPO | Admitting: Internal Medicine

## 2013-05-03 ENCOUNTER — Encounter: Payer: Self-pay | Admitting: *Deleted

## 2013-05-04 ENCOUNTER — Ambulatory Visit: Payer: BC Managed Care – PPO | Admitting: Internal Medicine

## 2013-05-17 ENCOUNTER — Other Ambulatory Visit: Payer: Self-pay

## 2013-05-25 ENCOUNTER — Encounter: Payer: Self-pay | Admitting: Internal Medicine

## 2013-05-25 ENCOUNTER — Ambulatory Visit (INDEPENDENT_AMBULATORY_CARE_PROVIDER_SITE_OTHER): Payer: BC Managed Care – PPO | Admitting: Internal Medicine

## 2013-05-25 VITALS — BP 122/76 | HR 64 | Resp 12 | Ht 65.5 in | Wt 160.0 lb

## 2013-05-25 DIAGNOSIS — R6889 Other general symptoms and signs: Secondary | ICD-10-CM | POA: Insufficient documentation

## 2013-05-25 DIAGNOSIS — F4329 Adjustment disorder with other symptoms: Secondary | ICD-10-CM | POA: Insufficient documentation

## 2013-05-25 DIAGNOSIS — F419 Anxiety disorder, unspecified: Secondary | ICD-10-CM | POA: Insufficient documentation

## 2013-05-25 DIAGNOSIS — F4323 Adjustment disorder with mixed anxiety and depressed mood: Secondary | ICD-10-CM

## 2013-05-25 MED ORDER — ALPRAZOLAM 0.25 MG PO TABS: 0.2500 mg | ORAL_TABLET | Freq: Three times a day (TID) | ORAL | Status: DC | PRN

## 2013-05-25 MED ORDER — VALACYCLOVIR HCL 500 MG PO TABS: 500.0000 mg | ORAL_TABLET | Freq: Two times a day (BID) | ORAL | Status: DC

## 2013-05-25 MED ORDER — BUPROPION HCL ER (XL) 150 MG PO TB24: 150.0000 mg | ORAL_TABLET | Freq: Every day | ORAL | Status: DC

## 2013-05-25 NOTE — Progress Notes (Signed)
Pre visit review using our clinic review tool, if applicable. No additional management support is needed unless otherwise documented below in the visit note. 

## 2013-05-25 NOTE — Patient Instructions (Signed)
Feeling Good by Lawerance Bach - workbook

## 2013-05-25 NOTE — Progress Notes (Signed)
Subjective:    Patient ID: Shannon Davidson, female    DOB: 05-12-1981, 32 y.o.   MRN: [id number]  HPI 32 year old female presents for followup. She reports it has been a difficult time for her. She completed her separation from the father of her child. She reports that he is now remarried and she is sharing custody of their 71-year-old son. This has been difficult for her. She reports feeling depressed and anxious and her son is away from her. She has difficulty sleeping. She feels exhausted. She has considered counseling but does not have the money for this. In the past, she tried using Lexapro with some improvement in her symptoms. Should like to consider medication again.  She is also concerned about temperature intolerance. She reports that she typically feels hot when others feel comfortable. This is been ongoing for years. She has not recently had thyroid function checked. She questions whether this may be secondary to PCO S.  Outpatient Encounter Prescriptions as of 05/25/2013  Medication Sig  . ALPRAZolam (XANAX) 0.25 MG tablet Take 1 tablet (0.25 mg total) by mouth 3 (three) times daily as needed for anxiety.  . cyclobenzaprine (FLEXERIL) 10 MG tablet Take 1 tablet (10 mg total) by mouth 3 (three) times daily as needed for muscle spasms.  . valACYclovir (VALTREX) 500 MG tablet Take 1 tablet (500 mg total) by mouth 2 (two) times daily.   BP 122/76  Pulse 64  Resp 12  Ht 5' 5.5" (1.664 m)  Wt 160 lb (72.576 kg)  BMI 26.21 kg/m2  SpO2 97%  Review of Systems  Constitutional: Negative for fever, chills, appetite change, fatigue and unexpected weight change.  HENT: Negative for congestion, ear pain, sinus pressure, sore throat, trouble swallowing and voice change.   Eyes: Negative for visual disturbance.  Respiratory: Negative for cough, shortness of breath, wheezing and stridor.   Cardiovascular: Negative for chest pain, palpitations and leg swelling.  Gastrointestinal: Negative for  nausea, vomiting, abdominal pain, diarrhea, constipation, blood in stool, abdominal distention and anal bleeding.  Endocrine: Positive for heat intolerance.  Genitourinary: Negative for dysuria and flank pain.  Musculoskeletal: Negative for arthralgias, gait problem, myalgias and neck pain.  Skin: Negative for color change and rash.  Neurological: Negative for dizziness and headaches.  Hematological: Negative for adenopathy. Does not bruise/bleed easily.  Psychiatric/Behavioral: Positive for sleep disturbance and dysphoric mood. Negative for suicidal ideas. The patient is nervous/anxious.        Objective:   Physical Exam  Constitutional: She is oriented to person, place, and time. She appears well-developed and well-nourished. No distress.  HENT:  Head: Normocephalic and atraumatic.  Right Ear: External ear normal.  Left Ear: External ear normal.  Nose: Nose normal.  Mouth/Throat: Oropharynx is clear and moist. No oropharyngeal exudate.  Eyes: Conjunctivae are normal. Pupils are equal, round, and reactive to light. Right eye exhibits no discharge. Left eye exhibits no discharge. No scleral icterus.  Neck: Normal range of motion. Neck supple. No tracheal deviation present. No thyromegaly present.  Cardiovascular: Normal rate, regular rhythm, normal heart sounds and intact distal pulses.  Exam reveals no gallop and no friction rub.   No murmur heard. Pulmonary/Chest: Effort normal and breath sounds normal. No accessory muscle usage. Not tachypneic. No respiratory distress. She has no decreased breath sounds. She has no wheezes. She has no rhonchi. She has no rales. She exhibits no tenderness.  Musculoskeletal: Normal range of motion. She exhibits no edema and no tenderness.  Lymphadenopathy:  She has no cervical adenopathy.  Neurological: She is alert and oriented to person, place, and time. No cranial nerve deficit. She exhibits normal muscle tone. Coordination normal.  Skin: Skin is  warm and dry. No rash noted. She is not diaphoretic. No erythema. No pallor.  Psychiatric: Her behavior is normal. Judgment and thought content normal. Her mood appears anxious. She exhibits a depressed mood.          Assessment & Plan:

## 2013-05-25 NOTE — Assessment & Plan Note (Signed)
Intolerance to hot temperatures. Question thyroid dysfunction. Will check TSH with labs at [facility].

## 2013-05-25 NOTE — Assessment & Plan Note (Signed)
Symptoms consistent with adjustment disorder with mixed depression and anxiety. Recommended counseling, however she cannot afford this. Will start Wellbutrin 150mg  daily. Follow up 2-4 weeks and prn.

## 2013-06-04 ENCOUNTER — Telehealth: Payer: Self-pay | Admitting: Internal Medicine

## 2013-06-04 NOTE — Telephone Encounter (Signed)
Labs

## 2013-06-06 ENCOUNTER — Encounter: Payer: Self-pay | Admitting: Internal Medicine

## 2013-06-21 ENCOUNTER — Encounter: Payer: Self-pay | Admitting: Internal Medicine

## 2013-06-28 ENCOUNTER — Ambulatory Visit: Payer: BC Managed Care – PPO | Admitting: Internal Medicine

## 2013-11-30 ENCOUNTER — Ambulatory Visit: Payer: BC Managed Care – PPO | Admitting: Adult Health

## 2014-02-08 ENCOUNTER — Ambulatory Visit: Payer: Self-pay | Admitting: Unknown Physician Specialty

## 2014-02-20 ENCOUNTER — Encounter: Payer: Self-pay | Admitting: Internal Medicine

## 2014-02-20 ENCOUNTER — Ambulatory Visit (INDEPENDENT_AMBULATORY_CARE_PROVIDER_SITE_OTHER): Payer: BC Managed Care – PPO | Admitting: Internal Medicine

## 2014-02-20 VITALS — BP 110/70 | HR 72 | Temp 99.4°F | Ht 65.5 in | Wt 175.5 lb

## 2014-02-20 DIAGNOSIS — M2550 Pain in unspecified joint: Secondary | ICD-10-CM | POA: Insufficient documentation

## 2014-02-20 DIAGNOSIS — R7989 Other specified abnormal findings of blood chemistry: Secondary | ICD-10-CM

## 2014-02-20 DIAGNOSIS — R110 Nausea: Secondary | ICD-10-CM

## 2014-02-20 DIAGNOSIS — R945 Abnormal results of liver function studies: Principal | ICD-10-CM

## 2014-02-20 LAB — COMPREHENSIVE METABOLIC PANEL
ALT: 18 U/L (ref 0–35)
AST: 17 U/L (ref 0–37)
Albumin: 4.3 g/dL (ref 3.5–5.2)
Alkaline Phosphatase: 33 U/L — ABNORMAL LOW (ref 39–117)
BILIRUBIN TOTAL: 1 mg/dL (ref 0.2–1.2)
BUN: 17 mg/dL (ref 6–23)
CALCIUM: 9.3 mg/dL (ref 8.4–10.5)
CHLORIDE: 105 meq/L (ref 96–112)
CO2: 25 mEq/L (ref 19–32)
CREATININE: 0.8 mg/dL (ref 0.4–1.2)
GFR: 93.85 mL/min (ref >60.00)
Glucose, Bld: 86 mg/dL (ref 70–99)
Potassium: 4.2 mEq/L (ref 3.5–5.1)
Sodium: 137 mEq/L (ref 135–145)
Total Protein: 7 g/dL (ref 6.0–8.3)

## 2014-02-20 LAB — TSH: TSH: 2.05 u[IU]/mL (ref 0.35–4.50)

## 2014-02-20 LAB — APTT: APTT: 28.3 s (ref 23.4–32.7)

## 2014-02-20 LAB — SEDIMENTATION RATE: SED RATE: 12 mm/h (ref 0–22)

## 2014-02-20 LAB — T4, FREE: FREE T4: 0.96 ng/dL (ref 0.60–1.60)

## 2014-02-20 LAB — C-REACTIVE PROTEIN: CRP: <0.5 mg/dL (ref 0.5–20.0)

## 2014-02-20 LAB — PROTIME-INR
INR: 0.9 ratio (ref 0.8–1.0)
Prothrombin Time: 10.4 s (ref 9.6–13.1)

## 2014-02-20 LAB — LIPASE: Lipase: 27 U/L (ref 11.0–59.0)

## 2014-02-20 MED ORDER — ONDANSETRON HCL 8 MG PO TABS: 8.0000 mg | ORAL_TABLET | Freq: Three times a day (TID) | ORAL | Status: DC | PRN

## 2014-02-20 NOTE — Patient Instructions (Signed)
Labs today to evaluate cause of elevated liver function tests.  Follow up in 2 weeks.

## 2014-02-20 NOTE — Progress Notes (Signed)
Subjective:    Patient ID: Shannon Davidson, female    DOB: 11/18/1980, 33 y.o.   MRN: [id number]  HPI 33YO female presents for acute visit.  4 weeks ago developed nausea and frequent soft stools. No correlation with eating. Traveled to beach, and had continued nausea and vomiting. Abdomen felt bloated and hard at times. No severe abdominal pain. No new medications or supplements. Occasional beer, but no regular alcohol. Feeling tired during that time. Went to see [facility] after symptoms persistent x3 weeks. LFTs slightly elevated. H. Pylori negative. Testing for viral hepatitis and abdominal US negative. Concerned about recent weight gain, of about 10-15lbs.  Also concerned about joint stiffness for several years. Noted more in the morning. Last two months knees have been sore. Taking Ibuprofen rarely for joint pain.  Review of Systems  Constitutional: Positive for fatigue. Negative for fever, chills, appetite change and unexpected weight change.  Eyes: Negative for visual disturbance.  Respiratory: Negative for shortness of breath.   Cardiovascular: Negative for chest pain and leg swelling.  Gastrointestinal: Positive for nausea, vomiting, diarrhea and abdominal distention. Negative for abdominal pain, constipation, blood in stool and rectal pain.  Musculoskeletal: Positive for arthralgias, joint swelling and myalgias. Negative for back pain and gait problem.  Skin: Negative for color change and rash.  Hematological: Negative for adenopathy. Does not bruise/bleed easily.  Psychiatric/Behavioral: Negative for dysphoric mood. The patient is not nervous/anxious.        Objective:    BP 110/70  Pulse 72  Temp(Src) 99.4 F (37.4 C) (Oral)  Ht 5' 5.5" (1.664 m)  Wt 175 lb 8 oz (79.606 kg)  BMI 28.75 kg/m2  SpO2 96%  LMP 01/20/2014 Physical Exam  Constitutional: She is oriented to person, place, and time. She appears well-developed and well-nourished. No distress.  HENT:   Head: Normocephalic and atraumatic.  Right Ear: External ear normal.  Left Ear: External ear normal.  Nose: Nose normal.  Mouth/Throat: Oropharynx is clear and moist. No oropharyngeal exudate.  Eyes: Conjunctivae and EOM are normal. Pupils are equal, round, and reactive to light. Right eye exhibits no discharge. Left eye exhibits no discharge. No scleral icterus.  Neck: Normal range of motion. Neck supple. No tracheal deviation present. No thyromegaly present.  Cardiovascular: Normal rate, regular rhythm, normal heart sounds and intact distal pulses.  Exam reveals no gallop and no friction rub.   No murmur heard. Pulmonary/Chest: Effort normal and breath sounds normal. No accessory muscle usage. Not tachypneic. No respiratory distress. She has no decreased breath sounds. She has no wheezes. She has no rhonchi. She has no rales. She exhibits no tenderness.  Abdominal: Soft. Bowel sounds are normal. She exhibits no distension and no mass. There is no tenderness. There is no rebound and no guarding.  Musculoskeletal: Normal range of motion. She exhibits no edema and no tenderness.  Lymphadenopathy:    She has no cervical adenopathy.  Neurological: She is alert and oriented to person, place, and time. No cranial nerve deficit. She exhibits normal muscle tone. Coordination normal.  Skin: Skin is warm and dry. No rash noted. She is not diaphoretic. No erythema. No pallor.  Psychiatric: She has a normal mood and affect. Her behavior is normal. Judgment and thought content normal.          Assessment & Plan:   Problem List Items Addressed This Visit     Unprioritized   Arthralgia     Arthralgia worse in the mornings, concerning for  RA. Will check ANA, ESR, CRP, RF today with labs.    Elevated LFTs - Primary     Elevated LFTs on labs from 7/30. Reviewed notes from GI. Viral hepatitis serologies negative. Symptoms are concerning for autoimmune hepatitis. Will check ANA, anti-mitochondrial  antibodies, TSH, PT/PTT with labs today.    Relevant Orders      ANA      Sed Rate (ESR)      C-reactive protein      AntiMicrosomal Ab-Liver / Kidney      Comp Met (CMET)      Rheumatoid Factor      TSH      T4, free      Lipase      IgG, IgA, IgM      Ceruloplasmin      Anti-smooth muscle antibody, IgG      INR/PT      PTT   Nausea     Nausea over last 4 weeks and elevated LFTs. Work up as noted. Will use prn Ondansetron for nausea.    Relevant Medications      ondansetron (ZOFRAN) tablet       Return in about 2 weeks (around 03/06/2014) for Recheck.

## 2014-02-20 NOTE — Assessment & Plan Note (Signed)
Nausea over last 4 weeks and elevated LFTs. Work up as noted. Will use prn Ondansetron for nausea.

## 2014-02-20 NOTE — Assessment & Plan Note (Signed)
Arthralgia worse in the mornings, concerning for RA. Will check ANA, ESR, CRP, RF today with labs.

## 2014-02-20 NOTE — Progress Notes (Signed)
Pre visit review using our clinic review tool, if applicable. No additional management support is needed unless otherwise documented below in the visit note. 

## 2014-02-20 NOTE — Assessment & Plan Note (Signed)
Elevated LFTs on labs from 7/30. Reviewed notes from GI. Viral hepatitis serologies negative. Symptoms are concerning for autoimmune hepatitis. Will check ANA, anti-mitochondrial antibodies, TSH, PT/PTT with labs today.

## 2014-02-21 LAB — ANA: Anti Nuclear Antibody(ANA): NEGATIVE

## 2014-02-21 LAB — ANTI-MICROSOMAL ANTIBODY LIVER / KIDNEY

## 2014-02-21 LAB — IGG, IGA, IGM
IgA: 277 mg/dL (ref 69–380)
IgG (Immunoglobin G), Serum: 905 mg/dL (ref 690–1700)
IgM, Serum: 72 mg/dL (ref 52–322)

## 2014-02-21 LAB — RHEUMATOID FACTOR

## 2014-02-22 LAB — ANTI-SMOOTH MUSCLE ANTIBODY, IGG: SMOOTH MUSCLE AB: 10 U (ref <20)

## 2014-02-22 LAB — CERULOPLASMIN: Ceruloplasmin: 20 mg/dL (ref 18–53)

## 2014-03-07 ENCOUNTER — Telehealth: Payer: Self-pay | Admitting: Internal Medicine

## 2014-03-07 ENCOUNTER — Ambulatory Visit: Payer: BC Managed Care – PPO | Admitting: Internal Medicine

## 2014-03-07 NOTE — Telephone Encounter (Signed)
Fine to cancel off schedule.

## 2014-03-07 NOTE — Telephone Encounter (Signed)
Pt called to cancel appt at 4:15. Pt stated that she feels better. Please advise to cancel pt off schedule.msn

## 2014-04-24 ENCOUNTER — Emergency Department: Payer: Self-pay | Admitting: Emergency Medicine

## 2014-04-25 DIAGNOSIS — N200 Calculus of kidney: Secondary | ICD-10-CM | POA: Insufficient documentation

## 2014-04-25 LAB — CBC WITH DIFFERENTIAL/PLATELET
BASOS ABS: 0.1 10*3/uL (ref 0.0–0.1)
BASOS PCT: 0.5 %
Eosinophil #: 0 10*3/uL (ref 0.0–0.7)
Eosinophil %: 0.3 %
HCT: 37.3 % (ref 35.0–47.0)
HGB: 12 g/dL (ref 12.0–16.0)
LYMPHS PCT: 13 %
Lymphocyte #: 1.7 10*3/uL (ref 1.0–3.6)
MCH: 30 pg (ref 26.0–34.0)
MCHC: 32.2 g/dL (ref 32.0–36.0)
MCV: 93 fL (ref 80–100)
MONO ABS: 1.1 x10 3/mm — AB (ref 0.2–0.9)
MONOS PCT: 8.6 %
Neutrophil #: 10.2 10*3/uL — ABNORMAL HIGH (ref 1.4–6.5)
Neutrophil %: 77.6 %
PLATELETS: 201 10*3/uL (ref 150–440)
RBC: 4 10*6/uL (ref 3.80–5.20)
RDW: 12.6 % (ref 11.5–14.5)
WBC: 13.2 10*3/uL — AB (ref 3.6–11.0)

## 2014-04-25 LAB — URINALYSIS, COMPLETE
Bacteria: NONE SEEN
Bilirubin,UR: NEGATIVE
GLUCOSE, UR: NEGATIVE mg/dL (ref 0–75)
Ketone: NEGATIVE
NITRITE: NEGATIVE
PH: 5 (ref 4.5–8.0)
PROTEIN: NEGATIVE
Specific Gravity: 1.029 (ref 1.003–1.030)
WBC UR: 5 /HPF (ref 0–5)

## 2014-04-25 LAB — COMPREHENSIVE METABOLIC PANEL
AST: 55 U/L — AB (ref 15–37)
Albumin: 3 g/dL — ABNORMAL LOW (ref 3.4–5.0)
Alkaline Phosphatase: 33 U/L — ABNORMAL LOW
Anion Gap: 9 (ref 7–16)
BUN: 11 mg/dL (ref 7–18)
Bilirubin,Total: 0.4 mg/dL (ref 0.2–1.0)
CALCIUM: 7.1 mg/dL — AB (ref 8.5–10.1)
Chloride: 109 mmol/L — ABNORMAL HIGH (ref 98–107)
Co2: 23 mmol/L (ref 21–32)
Creatinine: 0.92 mg/dL (ref 0.60–1.30)
EGFR (Non-African Amer.): >60
GLUCOSE: 90 mg/dL (ref 65–99)
Osmolality: 280 (ref 275–301)
Potassium: 3.8 mmol/L (ref 3.5–5.1)
SGPT (ALT): 41 U/L
Sodium: 141 mmol/L (ref 136–145)
Total Protein: 5.8 g/dL — ABNORMAL LOW (ref 6.4–8.2)

## 2014-05-18 LAB — HM PAP SMEAR
HM PAP: NORMAL
HM Pap smear: POSITIVE

## 2014-06-17 ENCOUNTER — Ambulatory Visit (INDEPENDENT_AMBULATORY_CARE_PROVIDER_SITE_OTHER): Payer: BC Managed Care – PPO | Admitting: Internal Medicine

## 2014-06-17 ENCOUNTER — Encounter: Payer: Self-pay | Admitting: Internal Medicine

## 2014-06-17 VITALS — BP 120/80 | HR 77 | Temp 98.4°F | Ht 65.5 in | Wt 173.0 lb

## 2014-06-17 DIAGNOSIS — Z0000 Encounter for general adult medical examination without abnormal findings: Secondary | ICD-10-CM

## 2014-06-17 DIAGNOSIS — M545 Low back pain, unspecified: Secondary | ICD-10-CM | POA: Insufficient documentation

## 2014-06-17 DIAGNOSIS — M5441 Lumbago with sciatica, right side: Secondary | ICD-10-CM

## 2014-06-17 DIAGNOSIS — M5442 Lumbago with sciatica, left side: Secondary | ICD-10-CM

## 2014-06-17 MED ORDER — ALPRAZOLAM 0.25 MG PO TABS: 0.2500 mg | ORAL_TABLET | Freq: Every evening | ORAL | Status: DC | PRN

## 2014-06-17 NOTE — Assessment & Plan Note (Signed)
General medical exam normal today. Breast and pelvic exam deferred as recently completed by OB. PAP reportedly HPV pos so will need repeat in 1 year. Reviewed recent labs. Will check fasting lipids through her work next week. Immunizations are UTD. Encouraged healthy diet and exercise.

## 2014-06-17 NOTE — Progress Notes (Signed)
Pre visit review using our clinic review tool, if applicable. No additional management support is needed unless otherwise documented below in the visit note. 

## 2014-06-17 NOTE — Assessment & Plan Note (Signed)
Recent low back pain described as intermittent "vibrations" and numbness bilateral lateral thighs. Exam today is normal. Discussed potential evaluation with imaging, however will hold off for now and monitor. She is being followed by Dr. Laveda Abbe and Dr. Sharlet Salina. She will email if she would like to pursue additional testing. In the interim, continue prn Flexeril.

## 2014-06-17 NOTE — Progress Notes (Signed)
Subjective:    Patient ID: Shannon Davidson, female    DOB: 1980/09/30, 33 y.o.   MRN: [id number]  HPI 33YO female presents for annual exam.  Last month had a 5x17mm left kidney stone. Had lithotripsy with Dr. Bernardo Heater. Pain has resolved.  Some recent cold symptoms with nasal congestoin. Treating with OTC medication with gradual improvement in symptoms.  She also notes some recent episodes of low back "vibrations" with some numbness lateral legs. This has been every since lithotripsy. Symptoms come and go. No weakness. No loss of control of bowel or bladder. No fever, chills.   Past medical, surgical, family and social history per today's encounter.  Review of Systems  Constitutional: Negative for fever, chills, appetite change, fatigue and unexpected weight change.  Eyes: Negative for visual disturbance.  Respiratory: Negative for shortness of breath.   Cardiovascular: Negative for chest pain and leg swelling.  Gastrointestinal: Negative for vomiting, abdominal pain, diarrhea and constipation.  Genitourinary: Negative for dysuria, frequency and decreased urine volume.  Musculoskeletal: Positive for myalgias, back pain and arthralgias.  Skin: Negative for color change and rash.  Neurological: Positive for numbness. Negative for dizziness, weakness and light-headedness.  Hematological: Negative for adenopathy. Does not bruise/bleed easily.  Psychiatric/Behavioral: Negative for dysphoric mood. The patient is not nervous/anxious.        Objective:    BP 120/80 mmHg  Pulse 77  Temp(Src) 98.4 F (36.9 C) (Oral)  Ht 5' 5.5" (1.664 m)  Wt 173 lb (78.472 kg)  BMI 28.34 kg/m2  SpO2 98%  LMP 05/31/2014 Physical Exam  Constitutional: She is oriented to person, place, and time. She appears well-developed and well-nourished. No distress.  HENT:  Head: Normocephalic and atraumatic.  Right Ear: External ear normal.  Left Ear: External ear normal.  Nose: Nose normal.  Mouth/Throat:  Oropharynx is clear and moist. No oropharyngeal exudate.  Eyes: Conjunctivae and EOM are normal. Pupils are equal, round, and reactive to light. Right eye exhibits no discharge.  Neck: Normal range of motion. Neck supple. No thyromegaly present.  Cardiovascular: Normal rate, regular rhythm, normal heart sounds and intact distal pulses.  Exam reveals no gallop and no friction rub.   No murmur heard. Pulmonary/Chest: Effort normal. No respiratory distress. She has no wheezes. She has no rales.  Abdominal: Soft. Bowel sounds are normal. She exhibits no distension and no mass. There is no tenderness. There is no rebound and no guarding.  Musculoskeletal: Normal range of motion. She exhibits no edema or tenderness.       Lumbar back: She exhibits normal range of motion, no tenderness, no bony tenderness, no edema, no deformity, no pain and no spasm.  Lymphadenopathy:    She has no cervical adenopathy.  Neurological: She is alert and oriented to person, place, and time. No cranial nerve deficit. Coordination normal.  Skin: Skin is warm and dry. No rash noted. She is not diaphoretic. No erythema. No pallor.  Psychiatric: She has a normal mood and affect. Her behavior is normal. Judgment and thought content normal.          Assessment & Plan:   Problem List Items Addressed This Visit      Unprioritized   Low back pain    Recent low back pain described as intermittent "vibrations" and numbness bilateral lateral thighs. Exam today is normal. Discussed potential evaluation with imaging, however will hold off for now and monitor. She is being followed by Dr. Laveda Abbe and Dr. Sharlet Salina. She will email  if she would like to pursue additional testing. In the interim, continue prn Flexeril.    Routine general medical examination at a health care facility - Primary    General medical exam normal today. Breast and pelvic exam deferred as recently completed by OB. PAP reportedly HPV pos so will need repeat in 1  year. Reviewed recent labs. Will check fasting lipids through her work next week. Immunizations are UTD. Encouraged healthy diet and exercise.        Return in about 1 year (around 06/18/2015) for Physical.

## 2014-06-17 NOTE — Patient Instructions (Signed)

## 2014-06-18 ENCOUNTER — Other Ambulatory Visit: Payer: Self-pay | Admitting: Internal Medicine

## 2014-06-18 ENCOUNTER — Encounter: Payer: Self-pay | Admitting: Internal Medicine

## 2014-06-18 DIAGNOSIS — M545 Low back pain: Secondary | ICD-10-CM

## 2014-06-25 LAB — BASIC METABOLIC PANEL
BUN: 13 mg/dL (ref 4–21)
CREATININE: 0.8 mg/dL (ref 0.5–1.1)
Glucose: 92 mg/dL
Potassium: 3.8 mmol/L (ref 3.4–5.3)
Sodium: 135 mmol/L — AB (ref 137–147)

## 2014-06-25 LAB — CBC AND DIFFERENTIAL
HCT: 41 % (ref 36–46)
Hemoglobin: 14.2 g/dL (ref 12.0–16.0)
Platelets: 262 10*3/uL (ref 150–399)
WBC: 10.5 10^3/mL

## 2014-06-25 LAB — LIPID PANEL
Cholesterol: 240 mg/dL — AB (ref 0–200)
HDL: 37 mg/dL (ref 35–70)
LDL CALC: 129 mg/dL
LDl/HDL Ratio: 6.4
Triglycerides: 367 mg/dL — AB (ref 40–160)

## 2014-06-25 LAB — HEPATIC FUNCTION PANEL
ALT: 12 U/L (ref 7–35)
AST: 12 U/L — AB (ref 13–35)
Alkaline Phosphatase: 32 U/L (ref 25–125)
Bilirubin, Total: 0.5 mg/dL

## 2014-06-27 ENCOUNTER — Encounter: Payer: Self-pay | Admitting: Internal Medicine

## 2014-07-20 ENCOUNTER — Ambulatory Visit: Payer: Self-pay | Admitting: Neurology

## 2014-08-02 ENCOUNTER — Ambulatory Visit: Payer: Self-pay | Admitting: Neurology

## 2014-08-23 ENCOUNTER — Ambulatory Visit: Payer: Self-pay | Admitting: Neurology

## 2014-09-13 ENCOUNTER — Telehealth: Payer: Self-pay

## 2014-09-13 NOTE — Telephone Encounter (Signed)
The patient called and wanted you to know Dr.Shaw has diagnosed her with M.S.  She believes notes will be sent over from his office, but she wanted you to know.

## 2014-09-13 NOTE — Telephone Encounter (Signed)
She stated she has several appointments at [facility] and she wants to follow up after these apts.  She is going to call.

## 2014-09-13 NOTE — Telephone Encounter (Signed)
Yes, I saw the notes from Dr. Manuella Ghazi. Does she want to follow up here to review things?

## 2014-11-01 ENCOUNTER — Ambulatory Visit
Admit: 2014-11-01 | Disposition: A | Discharge status: Home / Self Care | Payer: Self-pay | Attending: Neurology | Admitting: Neurology

## 2014-11-01 DIAGNOSIS — H469 Unspecified optic neuritis: Secondary | ICD-10-CM | POA: Insufficient documentation

## 2014-11-25 ENCOUNTER — Inpatient Hospital Stay: Payer: BLUE CROSS/BLUE SHIELD | Attending: Family Medicine | Admitting: Family Medicine

## 2014-11-25 ENCOUNTER — Other Ambulatory Visit: Payer: Self-pay | Admitting: Family Medicine

## 2014-11-25 ENCOUNTER — Encounter: Payer: Self-pay | Admitting: Family Medicine

## 2014-11-25 ENCOUNTER — Inpatient Hospital Stay: Payer: BLUE CROSS/BLUE SHIELD

## 2014-11-25 VITALS — BP 126/75 | HR 74 | Temp 98.5°F | Resp 18 | Ht 66.34 in | Wt 171.3 lb

## 2014-11-25 DIAGNOSIS — E785 Hyperlipidemia, unspecified: Secondary | ICD-10-CM | POA: Diagnosis not present

## 2014-11-25 DIAGNOSIS — R5383 Other fatigue: Secondary | ICD-10-CM | POA: Diagnosis not present

## 2014-11-25 DIAGNOSIS — G35 Multiple sclerosis: Secondary | ICD-10-CM

## 2014-11-25 DIAGNOSIS — R200 Anesthesia of skin: Secondary | ICD-10-CM | POA: Insufficient documentation

## 2014-11-25 DIAGNOSIS — R531 Weakness: Secondary | ICD-10-CM | POA: Diagnosis not present

## 2014-11-25 DIAGNOSIS — E282 Polycystic ovarian syndrome: Secondary | ICD-10-CM | POA: Insufficient documentation

## 2014-11-25 DIAGNOSIS — Z79899 Other long term (current) drug therapy: Secondary | ICD-10-CM | POA: Insufficient documentation

## 2014-11-25 HISTORY — DX: Multiple sclerosis: G35

## 2014-11-25 MED ORDER — ACETAMINOPHEN 500 MG PO TABS
1000.0000 mg | ORAL_TABLET | Freq: Once | ORAL | Status: AC
Start: 2014-11-25 — End: 2014-11-25
  Administered 2014-11-25: 1000 mg via ORAL
  Filled 2014-11-25: qty 2

## 2014-11-25 MED ORDER — SODIUM CHLORIDE 0.9 % IV SOLN
300.0000 mg | Freq: Once | INTRAVENOUS | Status: AC
  Administered 2014-11-25: 300 mg via INTRAVENOUS
  Filled 2014-11-25: qty 15

## 2014-11-25 MED ORDER — SODIUM CHLORIDE 0.9 % IV SOLN
Freq: Once | INTRAVENOUS | Status: AC
  Administered 2014-11-25: 11:00:00 via INTRAVENOUS
  Filled 2014-11-25: qty 250

## 2014-11-25 NOTE — Progress Notes (Signed)
[place]  [phone]  Fax:([phone] [place]: [birth year]  MR#: [id number]  HFW#:[id number]  Patient Care Team: Ronette Deter, MD as PCP - General (Internal Medicine)  CHIEF COMPLAINT:  Relapsing and remitting Multiple Sclerosis.   INTERVAL HISTORY:  Patient is here today to establish care for infusions of Tysabri for treatment of MS. Symptoms are reported as having began in December 2015. She reports feeling well today. She has previously trialed Tecfidera for management of MS. She experienced extreme fatigue and weakness as well as body aches while on Tecfidera. Upon stopping it her symptoms resolved in less than 2 weeks. She is followed by Dr. Manuella Ghazi at [facility]. Her PCP is Dr. Ronette Deter. She reports overall feeling well today and denies any complaints.   REVIEW OF SYSTEMS:   Review of Systems  Constitutional: Positive for malaise/fatigue.  Neurological: Positive for tingling and focal weakness.       Neurological symptoms are intermittent. She denies any complaints as of this morning.   All other systems reviewed and are negative.   As per HPI. Otherwise, a complete review of systems is negatve.  PAST MEDICAL HISTORY: Past Medical History  Diagnosis Date  . HLD (hyperlipidemia)   . Palpitations   . PCOS (polycystic ovarian syndrome)   . Pap smear for cervical cancer screening 2012    Dr. Benjie Karvonen  . Kidney stone     Dr. Bernardo Heater  . Relapsing remitting multiple sclerosis 11/25/2014    PAST SURGICAL HISTORY: Past Surgical History  Procedure Laterality Date  . Cesarean section  2008    FAMILY HISTORY Family History  Problem Relation Age of Onset  . Heart disease Maternal Grandmother 60    s/p CABG  . Dementia Maternal Grandmother   . Hypertension Mother   . Seizures Father   . Hyperlipidemia Father   . Achalasia Sister   . COPD Maternal Grandfather   . Diabetes Paternal Grandmother     GYNECOLOGIC HISTORY:   Approximately May 1st, 2016. Cycles are irregular due to PCOS.     ADVANCED DIRECTIVES: N/A   HEALTH MAINTENANCE: History  Substance Use Topics  . Smoking status: Never Smoker   . Smokeless tobacco: Never Used     Comment: tobacco use - no  . Alcohol Use: Yes     Comment: social - maybe 3 glasses of wine/month      Colonoscopy:  PAP:  Bone density:  Lipid panel:  No Known Allergies  Current Outpatient Prescriptions  Medication Sig Dispense Refill  . ALPRAZolam (XANAX) 0.25 MG tablet Take 1 tablet (0.25 mg total) by mouth at bedtime as needed for anxiety. 30 tablet 4  . cyclobenzaprine (FLEXERIL) 10 MG tablet Take 10 mg by mouth at bedtime as needed for muscle spasms.    . ondansetron (ZOFRAN) 8 MG tablet Take 1 tablet (8 mg total) by mouth every 8 (eight) hours as needed for nausea or vomiting. 60 tablet 0  . valACYclovir (VALTREX) 500 MG tablet Take 1 tablet (500 mg total) by mouth 2 (two) times daily. 14 tablet 3   No current facility-administered medications for this visit.   Facility-Administered Medications Ordered in Other Visits  Medication Dose Route Frequency Provider Last Rate Last Dose  . acetaminophen (TYLENOL) tablet 1,000 mg  1,000 mg Oral Once Evlyn Kanner, NP      . natalizumab (TYSABRI) 300 mg in sodium chloride 0.9 % 100 mL IVPB  300 mg Intravenous  Once Evlyn Kanner, NP        OBJECTIVE: There were no vitals filed for this visit.   There is no weight on file to calculate BMI.    ECOG FS:1 - Symptomatic but completely ambulatory  General: Well-developed, well-nourished, no acute distress. Eyes: Pink conjunctiva, anicteric sclera. HEENT: Normocephalic, moist mucous membranes, clear oropharnyx. Lungs: Clear to auscultation bilaterally. Heart: Regular rate and rhythm. No rubs, murmurs, or gallops. Abdomen: Soft, nontender, nondistended. No organomegaly noted, normoactive bowel sounds. Musculoskeletal: No edema, cyanosis, or clubbing. Neuro: Alert,  answering all questions appropriately. Cranial nerves grossly intact. Skin: No rashes or petechiae noted. Psych: Normal affect. Lymphatics: No cervical LAD.   LAB RESULTS:     Component Value Date/Time   NA 135* 06/25/2014   NA 141 04/25/2014 0644   NA 137 02/20/2014 0756   K 3.8 06/25/2014   K 3.8 04/25/2014 0644   CL 109* 04/25/2014 0644   CL 105 02/20/2014 0756   CO2 23 04/25/2014 0644   CO2 25 02/20/2014 0756   GLUCOSE 90 04/25/2014 0644   GLUCOSE 86 02/20/2014 0756   BUN 13 06/25/2014   BUN 11 04/25/2014 0644   BUN 17 02/20/2014 0756   CREATININE 0.8 06/25/2014   CREATININE 0.92 04/25/2014 0644   CREATININE 0.8 02/20/2014 0756   CALCIUM 7.1* 04/25/2014 0644   CALCIUM 9.3 02/20/2014 0756   PROT 5.8* 04/25/2014 0644   PROT 7.0 02/20/2014 0756   ALBUMIN 3.0* 04/25/2014 0644   ALBUMIN 4.3 02/20/2014 0756   AST 12* 06/25/2014   AST 55* 04/25/2014 0644   ALT 12 06/25/2014   ALT 41 04/25/2014 0644   ALKPHOS 32 06/25/2014   ALKPHOS 33* 04/25/2014 0644   BILITOT 1.0 02/20/2014 0756    No results found for: SPEP, UPEP  Lab Results  Component Value Date   WBC 10.5 06/25/2014   NEUTROABS 10.2* 04/25/2014   HGB 14.2 06/25/2014   HCT 41 06/25/2014   MCV 93 04/25/2014   PLT 262 06/25/2014      Chemistry      Component Value Date/Time   NA 135* 06/25/2014   NA 141 04/25/2014 0644   NA 137 02/20/2014 0756   K 3.8 06/25/2014   K 3.8 04/25/2014 0644   CL 109* 04/25/2014 0644   CL 105 02/20/2014 0756   CO2 23 04/25/2014 0644   CO2 25 02/20/2014 0756   BUN 13 06/25/2014   BUN 11 04/25/2014 0644   BUN 17 02/20/2014 0756   CREATININE 0.8 06/25/2014   CREATININE 0.92 04/25/2014 0644   CREATININE 0.8 02/20/2014 0756   GLU 92 06/25/2014      Component Value Date/Time   CALCIUM 7.1* 04/25/2014 0644   CALCIUM 9.3 02/20/2014 0756   ALKPHOS 32 06/25/2014   ALKPHOS 33* 04/25/2014 0644   AST 12* 06/25/2014   AST 55* 04/25/2014 0644   ALT 12 06/25/2014   ALT 41  04/25/2014 0644   BILITOT 1.0 02/20/2014 0756       No results found for: LABCA2  No components found for: FVCBS496  No results for input(s): INR in the last 168 hours.  No results found for: COLORURINE, APPEARANCEUR, LABSPEC, PHURINE, GLUCOSEU, HGBUR, BILIRUBINUR, KETONESUR, PROTEINUR, UROBILINOGEN, NITRITE, LEUKOCYTESUR  STUDIES: No results found.  ASSESSMENT:  Relapsing Remitting Multiple Sclerosis  PLAN:  1. MS. Received referral from Neurologist, Dr. Manuella Ghazi, for non-oncology infusions of Tysabri 300mg  every 4 weeks. Prescription is on file and current.  Patient was advised that any  questions regarding her diagnosis should be referred to her Neurologist. Her next scheduled follow up is May 22nd, 2016.  We will continue with treatments every 4 weeks. She will require another visit with provider at [facility] in 6 months.   Patient expressed understanding and was in agreement with this plan. She also understands that She can call clinic at any time with any questions, concerns, or complaints.     Evlyn Kanner, NP   11/25/2014 10:38 AM

## 2014-11-25 NOTE — Progress Notes (Signed)
New MS consult for Tysabri infusions. Today will be her 1st Tysabri infusion. She had to stop taking tecfedera d/t sever body aches. She stopped that med 2 weeks ago.

## 2014-12-18 ENCOUNTER — Telehealth: Payer: Self-pay | Admitting: Internal Medicine

## 2014-12-18 NOTE — Telephone Encounter (Signed)
Pt is request to speak with Dr. Gilford Rile about a person matter. Pt didn't want to give detailed information. Pt was advise that she would need to make an appt with Dr. Gilford Rile. Pt stated that she just wants to talk to her for a min.msn

## 2014-12-19 NOTE — Telephone Encounter (Signed)
Left vm to return my call.  Per Dr Gilford Rile we can see her today, 12/19/14 at 4:30

## 2014-12-23 NOTE — Telephone Encounter (Signed)
Left vm for pt to return my call.  

## 2014-12-25 ENCOUNTER — Inpatient Hospital Stay: Payer: BLUE CROSS/BLUE SHIELD | Attending: Family Medicine

## 2014-12-25 ENCOUNTER — Inpatient Hospital Stay: Payer: BLUE CROSS/BLUE SHIELD

## 2014-12-25 VITALS — BP 127/71 | HR 75 | Temp 98.1°F | Resp 18

## 2014-12-25 DIAGNOSIS — G35 Multiple sclerosis: Secondary | ICD-10-CM | POA: Diagnosis present

## 2014-12-25 MED ORDER — ACETAMINOPHEN 500 MG PO TABS
1000.0000 mg | ORAL_TABLET | Freq: Once | ORAL | Status: AC
  Administered 2014-12-25: 1000 mg via ORAL

## 2014-12-25 MED ORDER — SODIUM CHLORIDE 0.9 % IV SOLN
300.0000 mg | Freq: Once | INTRAVENOUS | Status: AC
  Administered 2014-12-25: 300 mg via INTRAVENOUS
  Filled 2014-12-25: qty 15

## 2014-12-25 MED ORDER — SODIUM CHLORIDE 0.9 % IV SOLN
Freq: Once | INTRAVENOUS | Status: AC
  Administered 2014-12-25: 11:00:00 via INTRAVENOUS
  Filled 2014-12-25: qty 250

## 2015-01-01 ENCOUNTER — Ambulatory Visit: Payer: BLUE CROSS/BLUE SHIELD

## 2015-01-20 ENCOUNTER — Inpatient Hospital Stay: Payer: BLUE CROSS/BLUE SHIELD

## 2015-01-20 ENCOUNTER — Inpatient Hospital Stay: Payer: BLUE CROSS/BLUE SHIELD | Admitting: Family Medicine

## 2015-01-20 ENCOUNTER — Encounter: Payer: Self-pay | Admitting: Internal Medicine

## 2015-01-20 ENCOUNTER — Ambulatory Visit (INDEPENDENT_AMBULATORY_CARE_PROVIDER_SITE_OTHER): Payer: BLUE CROSS/BLUE SHIELD | Admitting: Internal Medicine

## 2015-01-20 VITALS — BP 125/77 | HR 86 | Temp 98.6°F | Ht 65.5 in | Wt 170.1 lb

## 2015-01-20 DIAGNOSIS — J320 Chronic maxillary sinusitis: Secondary | ICD-10-CM | POA: Diagnosis not present

## 2015-01-20 DIAGNOSIS — I8393 Asymptomatic varicose veins of bilateral lower extremities: Secondary | ICD-10-CM

## 2015-01-20 DIAGNOSIS — G35 Multiple sclerosis: Secondary | ICD-10-CM

## 2015-01-20 DIAGNOSIS — B009 Herpesviral infection, unspecified: Secondary | ICD-10-CM | POA: Diagnosis not present

## 2015-01-20 DIAGNOSIS — I8390 Asymptomatic varicose veins of unspecified lower extremity: Secondary | ICD-10-CM | POA: Insufficient documentation

## 2015-01-20 MED ORDER — ALPRAZOLAM 0.25 MG PO TABS: 0.2500 mg | ORAL_TABLET | Freq: Every evening | ORAL | Status: DC | PRN

## 2015-01-20 NOTE — Assessment & Plan Note (Signed)
Chronic right maxillary sinus obstruction. Will set up ENT evaluation for direct visualization.

## 2015-01-20 NOTE — Assessment & Plan Note (Signed)
Mild varicosities noted. Will set up Vascular evaluation for possible LE dopplers to look for reflux. Recommended compression stockings.

## 2015-01-20 NOTE — Progress Notes (Signed)
Subjective:    Patient ID: Shannon Davidson, female    DOB: [birth year], 34 y.o.   MRN: [id number]  HPI  34YO female presents for follow up.  Diagnosed with MS. Following with Dr. Manuella Ghazi and Dr. Gorden Harms.  Concerned about risk of transmitting HSV to her significant other. Would like to set up an appointment to discuss this with him.  Varicose veins - No swelling in legs. Occasional pain in legs but rare. Few dilated blood vessels noted. Would like consultation with vascular.  Sinus infections - has chronic right maxillary sinus pressure and pain. Would like to set up ENT evaluation for possible obstruction. Not taking any allergy medication. No sneezing or watery eyes.  Past medical, surgical, family and social history per today's encounter.  Review of Systems  Constitutional: Negative for fever, chills, appetite change, fatigue and unexpected weight change.  HENT: Positive for congestion.   Eyes: Negative for visual disturbance.  Respiratory: Negative for shortness of breath.   Cardiovascular: Negative for chest pain and leg swelling.  Gastrointestinal: Negative for abdominal pain, diarrhea and constipation.  Musculoskeletal: Negative for myalgias and arthralgias.  Skin: Negative for color change and rash.  Hematological: Negative for adenopathy. Does not bruise/bleed easily.  Psychiatric/Behavioral: Negative for dysphoric mood. The patient is not nervous/anxious.        Objective:    BP 125/77 mmHg  Pulse 86  Temp(Src) 98.6 F (37 C) (Oral)  Ht 5' 5.5" (1.664 m)  Wt 170 lb 2 oz (77.168 kg)  BMI 27.87 kg/m2  SpO2 98%  LMP 12/21/2014 (Approximate) Physical Exam  Constitutional: She is oriented to person, place, and time. She appears well-developed and well-nourished. No distress.  HENT:  Head: Normocephalic and atraumatic.  Right Ear: External ear normal.  Left Ear: External ear normal.  Nose: Nose normal.  Mouth/Throat: Oropharynx is clear and moist. No oropharyngeal  exudate.  Eyes: Conjunctivae are normal. Pupils are equal, round, and reactive to light. Right eye exhibits no discharge. Left eye exhibits no discharge. No scleral icterus.  Neck: Normal range of motion. Neck supple. No tracheal deviation present. No thyromegaly present.  Cardiovascular: Normal rate, regular rhythm, normal heart sounds and intact distal pulses.  Exam reveals no gallop and no friction rub.   No murmur heard. Pulmonary/Chest: Effort normal and breath sounds normal. No respiratory distress. She has no wheezes. She has no rales. She exhibits no tenderness.  Musculoskeletal: Normal range of motion. She exhibits no edema or tenderness.  Lymphadenopathy:    She has no cervical adenopathy.  Neurological: She is alert and oriented to person, place, and time. No cranial nerve deficit. She exhibits normal muscle tone. Coordination normal.  Skin: Skin is warm and dry. No rash noted. She is not diaphoretic. No erythema. No pallor.  Psychiatric: She has a normal mood and affect. Her behavior is normal. Judgment and thought content normal.          Assessment & Plan:   Problem List Items Addressed This Visit      Unprioritized   Chronic maxillary sinusitis    Chronic right maxillary sinus obstruction. Will set up ENT evaluation for direct visualization.      Relevant Orders   Ambulatory referral to ENT   HSV infection    Will set up visit to discuss risks of transmission with her significant other. Continue Valtrex.      Relapsing remitting multiple sclerosis    Will request notes from Dr. Gorden Harms.  Relevant Medications   natalizumab (TYSABRI) 300 MG/15ML injection   Varicose vein of leg - Primary    Mild varicosities noted. Will set up Vascular evaluation for possible LE dopplers to look for reflux. Recommended compression stockings.      Relevant Orders   Ambulatory referral to Vascular Surgery       Return in about 4 weeks (around 02/17/2015) for  Recheck.

## 2015-01-20 NOTE — Patient Instructions (Addendum)
Consider using Mediven Compression Hose. You can find these at [facility].  We will set up evaluation with Dr. Lucky Cowboy, Dr. Kathyrn Sheriff.  Follow up here in 4 weeks.

## 2015-01-20 NOTE — Assessment & Plan Note (Signed)
Will set up visit to discuss risks of transmission with her significant other. Continue Valtrex.

## 2015-01-20 NOTE — Progress Notes (Signed)
Pre visit review using our clinic review tool, if applicable. No additional management support is needed unless otherwise documented below in the visit note. 

## 2015-01-20 NOTE — Assessment & Plan Note (Signed)
Will request notes from Dr. Gorden Harms.

## 2015-01-22 ENCOUNTER — Inpatient Hospital Stay: Payer: BLUE CROSS/BLUE SHIELD | Attending: Family Medicine

## 2015-01-22 VITALS — BP 118/72 | HR 67 | Temp 98.9°F | Resp 18

## 2015-01-22 DIAGNOSIS — Z79899 Other long term (current) drug therapy: Secondary | ICD-10-CM | POA: Diagnosis not present

## 2015-01-22 DIAGNOSIS — G35 Multiple sclerosis: Secondary | ICD-10-CM | POA: Diagnosis present

## 2015-01-22 MED ORDER — SODIUM CHLORIDE 0.9 % IV SOLN
Freq: Once | INTRAVENOUS | Status: AC
  Administered 2015-01-22: 09:00:00 via INTRAVENOUS
  Filled 2015-01-22: qty 1000

## 2015-01-22 MED ORDER — ACETAMINOPHEN 500 MG PO TABS
1000.0000 mg | ORAL_TABLET | Freq: Once | ORAL | Status: AC
  Administered 2015-01-22: 1000 mg via ORAL
  Filled 2015-01-22: qty 2

## 2015-01-22 MED ORDER — SODIUM CHLORIDE 0.9 % IV SOLN
300.0000 mg | Freq: Once | INTRAVENOUS | Status: AC
  Administered 2015-01-22: 300 mg via INTRAVENOUS
  Filled 2015-01-22: qty 15

## 2015-01-29 ENCOUNTER — Ambulatory Visit: Payer: BLUE CROSS/BLUE SHIELD

## 2015-02-13 ENCOUNTER — Ambulatory Visit: Payer: BLUE CROSS/BLUE SHIELD | Admitting: Internal Medicine

## 2015-02-17 ENCOUNTER — Inpatient Hospital Stay: Payer: BLUE CROSS/BLUE SHIELD | Admitting: Family Medicine

## 2015-02-17 ENCOUNTER — Inpatient Hospital Stay: Payer: BLUE CROSS/BLUE SHIELD

## 2015-02-19 ENCOUNTER — Inpatient Hospital Stay: Payer: BLUE CROSS/BLUE SHIELD | Attending: Family Medicine

## 2015-02-19 VITALS — BP 142/78 | HR 71 | Temp 98.0°F | Resp 18

## 2015-02-19 DIAGNOSIS — Z79899 Other long term (current) drug therapy: Secondary | ICD-10-CM | POA: Insufficient documentation

## 2015-02-19 DIAGNOSIS — G35 Multiple sclerosis: Secondary | ICD-10-CM | POA: Insufficient documentation

## 2015-02-19 MED ORDER — ACETAMINOPHEN 500 MG PO TABS
1000.0000 mg | ORAL_TABLET | Freq: Once | ORAL | Status: AC
  Administered 2015-02-19: 1000 mg via ORAL
  Filled 2015-02-19: qty 2

## 2015-02-19 MED ORDER — SODIUM CHLORIDE 0.9 % IV SOLN
Freq: Once | INTRAVENOUS | Status: AC
  Administered 2015-02-19: 09:00:00 via INTRAVENOUS
  Filled 2015-02-19: qty 1000

## 2015-02-19 MED ORDER — NATALIZUMAB 300 MG/15ML IV CONC
300.0000 mg | Freq: Once | INTRAVENOUS | Status: AC
  Administered 2015-02-19: 300 mg via INTRAVENOUS
  Filled 2015-02-19: qty 15

## 2015-02-21 ENCOUNTER — Other Ambulatory Visit: Payer: Self-pay | Admitting: *Deleted

## 2015-02-21 NOTE — Addendum Note (Signed)
Addended by: Livia Snellen on: 02/21/2015 11:11 AM   Modules accepted: Medications

## 2015-02-26 ENCOUNTER — Ambulatory Visit: Payer: BLUE CROSS/BLUE SHIELD

## 2015-03-17 ENCOUNTER — Inpatient Hospital Stay: Payer: BLUE CROSS/BLUE SHIELD

## 2015-03-17 ENCOUNTER — Inpatient Hospital Stay: Payer: BLUE CROSS/BLUE SHIELD | Admitting: Family Medicine

## 2015-03-19 ENCOUNTER — Inpatient Hospital Stay: Payer: BLUE CROSS/BLUE SHIELD | Attending: Family Medicine

## 2015-03-19 VITALS — BP 119/62 | HR 82 | Temp 97.6°F

## 2015-03-19 DIAGNOSIS — Z79899 Other long term (current) drug therapy: Secondary | ICD-10-CM | POA: Diagnosis not present

## 2015-03-19 DIAGNOSIS — G35 Multiple sclerosis: Secondary | ICD-10-CM | POA: Insufficient documentation

## 2015-03-19 MED ORDER — ACETAMINOPHEN 500 MG PO TABS
1000.0000 mg | ORAL_TABLET | Freq: Once | ORAL | Status: AC
  Administered 2015-03-19: 1000 mg via ORAL
  Filled 2015-03-19: qty 2

## 2015-03-19 MED ORDER — SODIUM CHLORIDE 0.9 % IV SOLN
300.0000 mg | Freq: Once | INTRAVENOUS | Status: AC
  Administered 2015-03-19: 300 mg via INTRAVENOUS
  Filled 2015-03-19: qty 15

## 2015-03-19 MED ORDER — SODIUM CHLORIDE 0.9 % IV SOLN
Freq: Once | INTRAVENOUS | Status: AC
  Administered 2015-03-19: 10:00:00 via INTRAVENOUS
  Filled 2015-03-19: qty 1000

## 2015-03-26 ENCOUNTER — Ambulatory Visit: Payer: BLUE CROSS/BLUE SHIELD

## 2015-04-14 ENCOUNTER — Inpatient Hospital Stay: Payer: BLUE CROSS/BLUE SHIELD

## 2015-04-14 ENCOUNTER — Inpatient Hospital Stay: Payer: BLUE CROSS/BLUE SHIELD | Admitting: Family Medicine

## 2015-04-16 ENCOUNTER — Inpatient Hospital Stay: Payer: BLUE CROSS/BLUE SHIELD | Attending: Family Medicine

## 2015-04-16 ENCOUNTER — Inpatient Hospital Stay: Payer: BLUE CROSS/BLUE SHIELD

## 2015-04-16 VITALS — BP 129/65 | HR 70 | Temp 98.2°F

## 2015-04-16 DIAGNOSIS — G35 Multiple sclerosis: Secondary | ICD-10-CM | POA: Diagnosis present

## 2015-04-16 DIAGNOSIS — Z79899 Other long term (current) drug therapy: Secondary | ICD-10-CM | POA: Insufficient documentation

## 2015-04-16 MED ORDER — ACETAMINOPHEN 500 MG PO TABS
1000.0000 mg | ORAL_TABLET | Freq: Once | ORAL | Status: AC
  Administered 2015-04-16: 1000 mg via ORAL

## 2015-04-16 MED ORDER — SODIUM CHLORIDE 0.9 % IV SOLN
Freq: Once | INTRAVENOUS | Status: AC
  Administered 2015-04-16: 10:00:00 via INTRAVENOUS
  Filled 2015-04-16: qty 1000

## 2015-04-16 MED ORDER — NATALIZUMAB 300 MG/15ML IV CONC
300.0000 mg | Freq: Once | INTRAVENOUS | Status: AC
  Administered 2015-04-16: 300 mg via INTRAVENOUS
  Filled 2015-04-16: qty 15

## 2015-04-18 ENCOUNTER — Encounter: Payer: Self-pay | Admitting: *Deleted

## 2015-04-18 ENCOUNTER — Emergency Department
Admission: EM | Admit: 2015-04-18 | Discharge: 2015-04-18 | Disposition: A | Discharge status: Home / Self Care | Payer: BLUE CROSS/BLUE SHIELD | Attending: Emergency Medicine | Admitting: Emergency Medicine

## 2015-04-18 DIAGNOSIS — K1230 Oral mucositis (ulcerative), unspecified: Secondary | ICD-10-CM | POA: Insufficient documentation

## 2015-04-18 DIAGNOSIS — Z79899 Other long term (current) drug therapy: Secondary | ICD-10-CM | POA: Insufficient documentation

## 2015-04-18 DIAGNOSIS — R1310 Dysphagia, unspecified: Secondary | ICD-10-CM | POA: Diagnosis present

## 2015-04-18 LAB — CBC WITH DIFFERENTIAL/PLATELET
BASOS PCT: 1 %
Basophils Absolute: 0.1 10*3/uL (ref 0–0.1)
EOS ABS: 0.2 10*3/uL (ref 0–0.7)
Eosinophils Relative: 2 %
HCT: 37.9 % (ref 35.0–47.0)
HEMOGLOBIN: 13.1 g/dL (ref 12.0–16.0)
Lymphocytes Relative: 50 %
Lymphs Abs: 3.3 10*3/uL (ref 1.0–3.6)
MCH: 31.2 pg (ref 26.0–34.0)
MCHC: 34.6 g/dL (ref 32.0–36.0)
MCV: 90.4 fL (ref 80.0–100.0)
MONOS PCT: 9 %
Monocytes Absolute: 0.6 10*3/uL (ref 0.2–0.9)
NEUTROS PCT: 38 %
Neutro Abs: 2.5 10*3/uL (ref 1.4–6.5)
Platelets: 292 10*3/uL (ref 150–440)
RBC: 4.2 MIL/uL (ref 3.80–5.20)
RDW: 13.9 % (ref 11.5–14.5)
WBC: 6.6 10*3/uL (ref 3.6–11.0)

## 2015-04-18 LAB — BASIC METABOLIC PANEL
Anion gap: 7 (ref 5–15)
BUN: 9 mg/dL (ref 6–20)
CHLORIDE: 107 mmol/L (ref 101–111)
CO2: 26 mmol/L (ref 22–32)
CREATININE: 0.73 mg/dL (ref 0.44–1.00)
Calcium: 9.2 mg/dL (ref 8.9–10.3)
GFR calc non Af Amer: >60 mL/min (ref >60)
Glucose, Bld: 94 mg/dL (ref 65–99)
Potassium: 4.2 mmol/L (ref 3.5–5.1)
SODIUM: 140 mmol/L (ref 135–145)

## 2015-04-18 MED ORDER — DIPHENHYDRAMINE HCL 12.5 MG/5ML PO ELIX
50.0000 mg | ORAL_SOLUTION | Freq: Once | ORAL | Status: AC
  Administered 2015-04-18: 37.5 mg via ORAL
  Filled 2015-04-18: qty 20
  Filled 2015-04-18: qty 15

## 2015-04-18 MED ORDER — MAGIC MOUTHWASH W/LIDOCAINE: 10.0000 mL | Freq: Three times a day (TID) | ORAL | Status: DC | PRN

## 2015-04-18 MED ORDER — LIDOCAINE VISCOUS 2 % MT SOLN
5.0000 mL | Freq: Once | OROMUCOSAL | Status: AC
  Administered 2015-04-18: 5 mL via OROMUCOSAL
  Filled 2015-04-18: qty 15

## 2015-04-18 MED ORDER — MAGIC MOUTHWASH W/LIDOCAINE: 5.0000 mL | Freq: Once | ORAL | Status: DC

## 2015-04-18 MED ORDER — LIDOCAINE VISCOUS 2 % MT SOLN
5.0000 mL | Freq: Once | OROMUCOSAL | Status: AC
  Administered 2015-04-18: 5 mL via OROMUCOSAL
  Filled 2015-04-18: qty 15
  Filled 2015-04-18: qty 5

## 2015-04-18 MED ORDER — MAGIC MOUTHWASH
5.0000 mL | Freq: Once | ORAL | Status: AC
  Administered 2015-04-18: 5 mL via ORAL
  Filled 2015-04-18: qty 5

## 2015-04-18 MED ORDER — PREDNISONE 20 MG PO TABS: 40.0000 mg | ORAL_TABLET | Freq: Every day | ORAL | Status: DC

## 2015-04-18 MED ORDER — DEXAMETHASONE SODIUM PHOSPHATE 10 MG/ML IJ SOLN
10.0000 mg | Freq: Once | INTRAMUSCULAR | Status: AC
  Administered 2015-04-18: 10 mg via INTRAVENOUS
  Filled 2015-04-18: qty 1

## 2015-04-18 NOTE — ED Notes (Signed)
Pt report sent by Dr. Evorn Gong due to increase swelling in mouth/neck. Pt being treated for MS, had infusion on Wednesday. 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 Pt reports difficulty swallowing and swelling in neck. No difficulty breathing at this time. Speaking in full sentences. O2 100% RA.

## 2015-04-18 NOTE — ED Notes (Signed)
MD at bedside, pt talking, denies sob or feeling like throat is closing, appears in no distress.

## 2015-04-18 NOTE — Discharge Instructions (Signed)
Oral Mucositis Oral mucositis is a mouth condition that may develop from treatments for cancer. With this condition, sores may appear on your lips, gums, tongue, throat, and the top (roof) or bottom (floor) of your mouth. CAUSES Oral mucositis can happen to anyone who is being treated with cancer therapies, including:  Cancer medicines (chemotherapy).  Radiation therapy.  Bone marrow transplants and stem cell transplants. Cancer treatments can damage the lining of the mouth, and that causes this condition. Oral mucositis is not caused by infection. However, the sores can become infected after they form. Infection can make oral mucositis worse. RISK FACTORS This condition is more likely to develop in people:  With poor oral hygiene.  With dental problems or oral diseases.  Who use any tobacco product, including cigarettes, chewing tobacco, and electronic cigarettes.  Who drink alcohol.  Who have other medical conditions, such as diabetes, HIV, AIDS, or kidney disease.  Who do not drink enough clear fluids.  Who wear dentures that do not fit correctly.  Who have cancers that primarily affect the blood.  Who are female. SYMPTOMS Symptoms can vary from mild to severe. Symptoms are usually seen 7-10 days after cancer treatment has started. Symptoms include:  Mouth sores. These sores may bleed.  Color changes inside the mouth. Red, shiny areas may appear.  Heyboer patches or pus in the mouth.  Pain in the mouth and throat. This can make it painful to speak.  Dryness and a burning feeling in the mouth.  Saliva that is dry and thick.  Trouble eating, drinking, and swallowing. This can lead to weight loss. DIAGNOSIS This condition can be diagnosed with a physical exam. TREATMENT Treatment depends on the severity of the condition. Oral mucositis often heals on its own. Sometimes, changes in the cancer treatment can help. Treatment may include medicines, such as:  An antibiotic  medicine to fight infection, if present.  Medicine to help the cells in your mouth heal more quickly. Medicine may also be given to help control pain. This may include:  Pain relievers that are swished around in the mouth. These make the mouth numb to ease the pain (topical anesthetics).   Mouth rinses.  Prescribed, medicated gels. The gel coats the mouth. This protects nerve endings and lessens pain.  Narcotic pain medicines. HOME CARE INSTRUCTIONS Medicines  If you were prescribed an antibiotic medicine, finish all of it even if you start to feel better.  Take or apply medicines only as directed by your health care provider. Lifestyle  Keeping your mouth clean and germ-free is important. To maintain good oral hygiene:  Brush your teeth carefully with a soft, nylon-bristled toothbrush at least two times each day. Use a gentle toothpaste. Ask your health care provider for a toothpaste recommendation.  Floss your teeth every day.  Have your teeth cleaned regularly as recommended by your dentist.  Rinse your mouth after every meal or as directed by your health care provider. Do not use mouthwash that contains alcohol. Ask your health care provider for a mouthwash recommendation.  Do not use any tobacco products, including cigarettes, chewing tobacco, and electronic cigarettes. If you need help quitting, ask your health care provider.  Avoid eating:  Hot and spicy foods.  Citrus.  Foods that have sharp edges, such as chips.  Sugary foods, such as candy.  Do not drink alcohol. General Instructions  Clean the sores as directed by your health care provider.  If your lips are dry or cracked, apply a  water-based moisturizer to your lips as needed.  Try sucking on ice chips or sugar-free frozen pops. This may help with pain. This also keeps your mouth moist.  Drink enough fluid to keep your urine clear or pale yellow.  If you are losing weight, talk with your health care  provider.  If you have dentures, take them out often as directed by your health care provider.  Keep all follow-up visits as directed by your health care provider. This is important. SEEK Immediate MEDICAL CARE IF:  You have increased mouth pain or throat pain.  You are having more trouble swallowing.  Your symptoms get worse.  You have new symptoms.  You have fever or headache  You have trouble breathing  You can not swallow  Your pain is not controlled with medicine.  You have a lot of bleeding in your mouth.  You have trouble speaking.  You develop new, open, or draining sores in your mouth.  You cannot swallow solid food or liquids.  You have a fever.   This information is not intended to replace advice given to you by your health care provider. Make sure you discuss any questions you have with your health care provider.   Document Released: 02/12/2011 Document Revised: 11/12/2014 Document Reviewed: 06/24/2014 Elsevier Interactive Patient Education [employer].

## 2015-04-18 NOTE — Consult Note (Signed)
Shannon Davidson, Shannon Davidson [id number] 11/30/1980 Shannon Kitten, MD   Reason for Consult: Oropharyngeal possible laryngeal swelling  HPI: Patient is a 34 year old white female who is had a history of multiple sclerosis. Been on multiple medications for this including Tysabri. 2 days ago after her last dose of this medication she started having a little dry mouth. By a yesterday she started having soreness in her mouth is thought it may be related to a viral flareup. He is having ulcers on her gums and was started on Valtrex. She started having more feeling of swelling and soreness in her throat and concern that she may be swelling more there and affecting her airway. She is seen in the emergency room to evaluate her airway.  Allergies: No Known Allergies  ROS: Review of systems normal other than 12 systems except per HPI.  PMH:  Past Medical History  Diagnosis Date  . HLD (hyperlipidemia)   . Palpitations   . PCOS (polycystic ovarian syndrome)   . Pap smear for cervical cancer screening 2012    Dr. Benjie Karvonen  . Kidney stone     Dr. Bernardo Heater  . Relapsing remitting multiple sclerosis ([place]) 11/25/2014  . MS (multiple sclerosis) ([facility])     FH:  Family History  Problem Relation Age of Onset  . Heart disease Maternal Grandmother 60    s/p CABG  . Dementia Maternal Grandmother   . Hypertension Mother   . Seizures Father   . Hyperlipidemia Father   . Achalasia Sister   . COPD Maternal Grandfather   . Diabetes Paternal Grandmother     SH:  Social History   Social History  . Marital Status: Single    Spouse Name: Shannon Davidson  . Number of Children: 1  . Years of Education: Shannon Davidson   Occupational History  . [employer]    Social History Main Topics  . Smoking status: Never Smoker   . Smokeless tobacco: Never Used     Comment: tobacco use - no  . Alcohol Use: Yes     Comment: social - maybe 3 glasses of wine/month   . Drug Use: No  . Sexual Activity: Not on file   Other Topics Concern  . Not on file    Social History Narrative   Lives in [place] with parents. Has one child.      Single, full time RN.     PSH:  Past Surgical History  Procedure Laterality Date  . Cesarean section  2008    Physical  Exam: Well-developed well-nourished of white female . CN 2-12 grossly intact and symmetric. Oral cavity, lips, gums, ororpharynx normal with no masses, but some small areas of ulcerations along her gums and one spot under the floor mouth. There is soft watery edema of the anterior floor mouth. Skin warm and dry. Nasal cavity without polyps or purulence. External nose and ears without masses or lesions. EOMI, PERRLA. Neck supple with no masses or lesions. No lymphadenopathy palpated. Thyroid normal with no masses.  Flexible Laryngoscopy was done which shows nice healthy nose and no sign of any inflammation or drainage in  Side.  the nasopharynx was clear and without inflammation. The hypopharynx shows no swelling of the tongue base. The vocal cords are pearly white move well. No swelling at the laryngeal inlet. No evidence of ulcerative lesions anywhere in the hypopharynx or larynx.   A/P: I suspect this is a reaction to the recent medication she's been taking. This may represent viral flareup in her  oropharynx and she needs to remain on the Valtrex. This could also represent a variant of angioedema with swelling under her tongue but there is no swelling at the tongue base or larynx. Are going to treat her with Decadron 10 mg try to take down the swelling and use a dose of Benadryl 50 mg to see if this will help. She can start on liquids to hydrate herself but should stay away from rackers and foods that could scratch more.  If she is doing well she can go home on a prednisone 6 day taper to help keep swelling under control temporarily. She is to see her MS specialist on Tuesday to discuss whether she should switch medications and avoid the recent one. She does not need ENT follow-up as long as  she doesn't have any further throat problems.  Shannon Davidson H 04/18/2015 6:03 PM

## 2015-04-18 NOTE — Op Note (Signed)
04/18/2015  6:00 PM    Shannon Davidson, Shannon Davidson  [id number]   Pre-Op Dx:  Oropharyngeal and possible laryngeal swelling  Post-op Dx: Same  Proc: Flexible laryngoscopy   Surg:  Shannon Davidson  Anes:  GOT  EBL:  None  Comp:  None  Findings:  Normal hypopharynx and larynx  Procedure: After the patient was explained about the procedure the nose was sprayed with phenylephrine and Xylocaine for vasoconstriction numbing in the nose. The flexor scope was passed through the left nostril to seem to be a little more open. This was passed into the nasopharynx which was clear. The hypopharynx showed normal base of tongue with no evidence of ulceration. The epiglottis looked normal with no sign of lesions here. The vocal cords were pearly white and moved well to the midline and opened widely. Subglottic space was clear. There was no evidence of swelling anywhere in the hypopharynx or larynx. I did not see any ulcerations anywhere in the hypopharynx or larynx.  Dispo:   To continue to be followed in the emergency room after medications  Plan: The patient does not have evidence of laryngeal edema and does not need to be hospitalized. She understands there is no swelling here that would cause any blockage of her airway. She may start on a liquid diet  Shannon Davidson  04/18/2015 6:00 PM

## 2015-04-18 NOTE — ED Notes (Signed)
Pt tolerated her po meds well.

## 2015-04-18 NOTE — ED Provider Notes (Signed)
[facility] Emergency Department Provider Note REMINDER - THIS NOTE IS NOT A FINAL MEDICAL RECORD UNTIL IT IS SIGNED. UNTIL THEN, THE CONTENT BELOW MAY REFLECT INFORMATION FROM A DOCUMENTATION TEMPLATE, NOT THE ACTUAL PATIENT VISIT. ____________________________________________  Time seen: Approximately 12:22 PM  I have reviewed the triage vital signs and the nursing notes.   HISTORY  Chief Complaint Dysphagia    HPI Shannon Davidson is a 34 y.o. female with a history of multiple sclerosis. The patient presents today noticing after her last injection with Tysabri she started having a dry mouth sensation on Wednesday. Thereafter she's noticed increased pain and swelling throughout her throat and feels as though she is having pain and difficulty swallowing. She notes that she has ulcers throughout the gums, and feels like it is slightly swollen underneath her tongue.  She denies fevers or chills. She does not have a headache. She does not have any neck stiffness or pain.  No trouble breathing or cough. She does not feel as though she's had any "cold" symptoms or other feeling of infection.   Past Medical History  Diagnosis Date  . HLD (hyperlipidemia)   . Palpitations   . PCOS (polycystic ovarian syndrome)   . Pap smear for cervical cancer screening 2012    Dr. Benjie Karvonen  . Kidney stone     Dr. Bernardo Heater  . Relapsing remitting multiple sclerosis ([place]) 11/25/2014  . MS (multiple sclerosis) [facility])     Patient Active Problem List   Diagnosis Date Noted  . Varicose vein of leg 01/20/2015  . HSV infection 01/20/2015  . Chronic maxillary sinusitis 01/20/2015  . Relapsing remitting multiple sclerosis ([place]) 11/25/2014  . Routine general medical examination at a health care facility 06/17/2014  . Low back pain 06/17/2014  . Elevated LFTs 02/20/2014  . Arthralgia 02/20/2014  . Adjustment disorder with mixed anxiety and depressed mood 05/25/2013  . Temperature intolerance  05/25/2013  . PCOS (polycystic ovarian syndrome) 09/09/2011  . Overweight (BMI 25.0-29.9) 06/08/2011  . HYPERLIPIDEMIA-MIXED 08/19/2009  . HEADACHE 08/19/2009  . PALPITATIONS 08/19/2009  . MURMUR 08/19/2009    Past Surgical History  Procedure Laterality Date  . Cesarean section  2008    Current Outpatient Rx  Name  Route  Sig  Dispense  Refill  . ALPRAZolam (XANAX) 0.25 MG tablet   Oral   Take 1 tablet (0.25 mg total) by mouth at bedtime as needed for anxiety.   30 tablet   4   . amphetamine-dextroamphetamine (ADDERALL) 15 MG tablet   Oral   Take 1 tablet by mouth 2 (two) times daily. as directed      0   . carbamazepine (TEGRETOL) 200 MG tablet      take 1 take by mouth at bedtime      0   . cholecalciferol (VITAMIN D) 1000 UNITS tablet   Oral   Take 5,000 Units by mouth daily.          . cyclobenzaprine (FLEXERIL) 10 MG tablet   Oral   Take 10 mg by mouth at bedtime as needed for muscle spasms.         . ergocalciferol (VITAMIN D2) 50000 UNITS capsule   Oral   Take 5,000 Units by mouth 1 day or 1 dose.          . natalizumab (TYSABRI) 300 MG/15ML injection   Intravenous   Inject into the vein.         Marland Kitchen norethindrone-ethinyl estradiol (JUNEL FE,GILDESS  FE,LOESTRIN FE) 1-20 MG-MCG tablet   Oral   Take 1 tablet by mouth daily.         . ondansetron (ZOFRAN) 8 MG tablet   Oral   Take 1 tablet (8 mg total) by mouth every 8 (eight) hours as needed for nausea or vomiting.   60 tablet   0   . SUMAtriptan (IMITREX) 100 MG tablet   Oral   Take 100 mg by mouth every 2 (two) hours as needed for migraine. May repeat in 2 hours if headache persists or recurs.         . valACYclovir (VALTREX) 500 MG tablet   Oral   Take 1 tablet (500 mg total) by mouth 2 (two) times daily.   14 tablet   3     Allergies Review of patient's allergies indicates no known allergies.  Family History  Problem Relation Age of Onset  . Heart disease Maternal  Grandmother 60    s/p CABG  . Dementia Maternal Grandmother   . Hypertension Mother   . Seizures Father   . Hyperlipidemia Father   . Achalasia Sister   . COPD Maternal Grandfather   . Diabetes Paternal Grandmother     Social History Social History  Substance Use Topics  . Smoking status: Never Smoker   . Smokeless tobacco: Never Used     Comment: tobacco use - no  . Alcohol Use: Yes     Comment: social - maybe 3 glasses of wine/month     Review of Systems Constitutional: No fever/chills Eyes: No visual changes. ENT: No sore throat. Cardiovascular: Denies chest pain. Respiratory: Denies shortness of breath. Gastrointestinal: No abdominal pain.  No nausea, no vomiting.  No diarrhea.  No constipation. Genitourinary: Negative for dysuria. Musculoskeletal: Negative for back pain. Skin: Negative for rash. Neurological: Negative for headaches, focal weakness or numbness.  10-point ROS otherwise negative.  ____________________________________________   PHYSICAL EXAM:  VITAL SIGNS: ED Triage Vitals  Enc Vitals Group     BP --      Pulse --      Resp --      Temp --      Temp src --      SpO2 --      Weight --      Height --      Head Cir --      Peak Flow --      Pain Score 04/18/15 1152 8     Pain Loc --      Pain Edu? --      Excl. in [place]? --    Constitutional: Alert and oriented. Well appearing and in no acute distress. Eyes: Conjunctivae are normal. PERRL. EOMI. Head: Atraumatic. Nose: No congestion/rhinnorhea. Mouth/Throat: Mucous membranes are moist.  Oropharynx non-erythematous. There are 5 small vesicular lesions on a slightly swollen base noted along the gumline and one on the base of the lingula. There is no pharyngeal edema. No stridor. Patient has no tenderness or swelling along the anterior neck except for some shotty slightly tender adenopathy. No tonsillar hypertrophy. No uvular shift or edema. Airway appears grossly open. No evidence of airway  compromise. Neck: No stridor.  No meningismus. No swelling or erythema. Cardiovascular: Normal rate, regular rhythm. Grossly normal heart sounds.  Good peripheral circulation. Respiratory: Normal respiratory effort.  No retractions. Lungs CTAB. Gastrointestinal: Soft and nontender. No distention. No abdominal bruits. No CVA tenderness. Musculoskeletal: No lower extremity tenderness nor edema.  No joint effusions.  Neurologic:  Normal speech and language. No gross focal neurologic deficits are appreciated. No gait instability. Skin:  Skin is warm, dry and intact. No rash noted. Psychiatric: Mood and affect are normal. Speech and behavior are normal.  ____________________________________________   LABS (all labs ordered are listed, but only abnormal results are displayed)  Labs Reviewed  CBC WITH DIFFERENTIAL/PLATELET  BASIC METABOLIC PANEL  POC URINE PREG, ED   ____________________________________________  EKG   ____________________________________________  RADIOLOGY   ____________________________________________   PROCEDURES  Procedure(s) performed: None  Critical Care performed: No  ____________________________________________   INITIAL IMPRESSION / ASSESSMENT AND PLAN / ED COURSE  Pertinent labs & imaging results that were available during my care of the patient were reviewed by me and considered in my medical decision making (see chart for details).  Patient presents with mucosal ulcers, which appear to be very painful. She does not have any obvious airways involvement, though there is a be a very minimal amount of sublingual or edema possibly due to an ulcer or lesion. Given the patient's reported pain with swallowing, we will have ear nose and throat evaluate the patient.  Case is discussed with Dr. Tamala Julian of neurology, he advised that this is likely a viral syndrome or reactivation secondary to her infusion and should be expected improvement in 7-10 days. The  patient does endorse feeling like the tongue is slightly swollen and I do feel there is some slight sublingual or edema, and given her slight dysphasia have requested consult by ear nose and throat who is able to see her in the clinic or alternatively see her this evening. We discussed with the patient, she would like to stay in the emergency room to be evaluated by Dr. Nena Jordan this evening in the ER.  We will continue to observe her, in the event this could be from an allergic reaction though I find less likely given the patient has no hives, itching, or other edema I will give her some Benadryl as well as we continue to observe her.  Patient is on Valtrex twice daily already for genital HSV. I advised her to continue this medication.  Ongoing care and disposition assigned Dr. Kerman Passey who will follow-up on an otolaryngology consult. I anticipate that if the patient has no significant pharyngeal edema or evidence of airway compromise by otolaryngology examination that we would discharge the patient with good return precautions and a prescription for Magic mouthwash. Patient will follow-up closely with neurology and her physician. ____________________________________________   FINAL CLINICAL IMPRESSION(S) / ED DIAGNOSES  Final diagnoses:  Mucositis      [name], MD 04/18/15 1610

## 2015-04-18 NOTE — ED Provider Notes (Addendum)
-----------------------------------------   6:31 PM on 04/18/2015 -----------------------------------------  Patient care assumed from Dr. Jacqualine Code. Patient has been seen by ENT, they recommend 10 mg Decadron IV, discharge on 6 day prednisone course. Patient is to continue taking her Valtrex. Patient has specialist follow-up on Tuesday. I discussed strict return precautions with the patient for any increased swelling in the mouth/throat, difficulty swallowing or speaking, patient agreeable to plan. We'll dose Decadron, monitor for approximately one hour in the emergency department and the patient continues to feel well we will discharge home.  Harvest Dark, MD 04/18/15 1831    ----------------------------------------- 8:21 PM on 04/18/2015 -----------------------------------------  Patient continues to feel very well, states she feels like the swelling has decreased. We'll discharge home with prednisone for the next 5 days, follow-up with her specialist on Tuesday as scheduled. I discussed strict return precautions with the patient, she is agreeable.  Harvest Dark, MD 04/18/15 2021

## 2015-04-23 ENCOUNTER — Ambulatory Visit: Payer: BLUE CROSS/BLUE SHIELD

## 2015-05-12 ENCOUNTER — Inpatient Hospital Stay: Payer: BLUE CROSS/BLUE SHIELD

## 2015-05-12 ENCOUNTER — Inpatient Hospital Stay: Payer: BLUE CROSS/BLUE SHIELD | Admitting: Family Medicine

## 2015-05-14 ENCOUNTER — Inpatient Hospital Stay: Payer: BLUE CROSS/BLUE SHIELD | Attending: Family Medicine

## 2015-05-14 ENCOUNTER — Inpatient Hospital Stay: Payer: BLUE CROSS/BLUE SHIELD

## 2015-05-14 VITALS — BP 116/63 | HR 72 | Temp 98.2°F | Resp 18

## 2015-05-14 DIAGNOSIS — Z79899 Other long term (current) drug therapy: Secondary | ICD-10-CM | POA: Insufficient documentation

## 2015-05-14 DIAGNOSIS — G35 Multiple sclerosis: Secondary | ICD-10-CM | POA: Diagnosis present

## 2015-05-14 MED ORDER — SODIUM CHLORIDE 0.9 % IV SOLN
Freq: Once | INTRAVENOUS | Status: AC
  Administered 2015-05-14: 09:00:00 via INTRAVENOUS
  Filled 2015-05-14: qty 1000

## 2015-05-14 MED ORDER — NATALIZUMAB 300 MG/15ML IV CONC
300.0000 mg | Freq: Once | INTRAVENOUS | Status: AC
  Administered 2015-05-14: 300 mg via INTRAVENOUS
  Filled 2015-05-14: qty 15

## 2015-05-14 MED ORDER — ACETAMINOPHEN 500 MG PO TABS
1000.0000 mg | ORAL_TABLET | Freq: Once | ORAL | Status: AC
  Administered 2015-05-14: 1000 mg via ORAL
  Filled 2015-05-14: qty 2

## 2015-06-11 ENCOUNTER — Inpatient Hospital Stay: Payer: BLUE CROSS/BLUE SHIELD

## 2015-06-18 ENCOUNTER — Encounter: Payer: Self-pay | Admitting: Internal Medicine

## 2015-06-18 ENCOUNTER — Ambulatory Visit (INDEPENDENT_AMBULATORY_CARE_PROVIDER_SITE_OTHER): Payer: Managed Care, Other (non HMO) | Admitting: Internal Medicine

## 2015-06-18 VITALS — BP 124/79 | HR 97 | Temp 98.8°F | Ht 65.0 in | Wt 170.4 lb

## 2015-06-18 DIAGNOSIS — G35 Multiple sclerosis: Secondary | ICD-10-CM

## 2015-06-18 DIAGNOSIS — Z0001 Encounter for general adult medical examination with abnormal findings: Secondary | ICD-10-CM | POA: Diagnosis not present

## 2015-06-18 DIAGNOSIS — R5382 Chronic fatigue, unspecified: Secondary | ICD-10-CM | POA: Insufficient documentation

## 2015-06-18 DIAGNOSIS — Z0000 Encounter for general adult medical examination without abnormal findings: Secondary | ICD-10-CM

## 2015-06-18 NOTE — Assessment & Plan Note (Signed)
Reviewed recent labs from her neurologist. Will look into support groups and programs for her locally. Continue Tysabri and Adderall per neurology.

## 2015-06-18 NOTE — Progress Notes (Signed)
Subjective:    Patient ID: Shannon Davidson, female    DOB: October 21, 1980, 34 y.o.   MRN: [id number]  HPI  34YO female presents for annual exam.  Having extreme fatigue with MS. Neurologist started her on Adderall to help with energy level during day. She notes some improvement with this. Recent labs were normal including thyroid, B12, Vit D. Tough time for her as single parent, working full time. She is struggling to find time for herself. Recently seen by psychiatrist and started on Cymbalta and then Sertraline, but stopped both medications as prefers to avoid meds if possible. Feels generally weak and fatigued. Also notes history of snoring during sleep, however has never been evaluated for sleep apnea.    Wt Readings from Last 3 Encounters:  06/18/15 170 lb 6 oz (77.282 kg)  01/20/15 170 lb 2 oz (77.168 kg)  11/25/14 171 lb 4.8 oz (77.7 kg)   BP Readings from Last 3 Encounters:  06/18/15 124/79  05/14/15 116/63  04/18/15 125/71    Past Medical History  Diagnosis Date  . HLD (hyperlipidemia)   . Palpitations   . PCOS (polycystic ovarian syndrome)   . Pap smear for cervical cancer screening 2012    Dr. Benjie Karvonen  . Kidney stone     Dr. Bernardo Heater  . Relapsing remitting multiple sclerosis ([place]) 11/25/2014  . MS (multiple sclerosis) ([place])    Family History  Problem Relation Age of Onset  . Heart disease Maternal Grandmother 60    s/p CABG  . Dementia Maternal Grandmother   . Hypertension Mother   . Seizures Father   . Hyperlipidemia Father   . Achalasia Sister   . COPD Maternal Grandfather   . Diabetes Paternal Grandmother    Past Surgical History  Procedure Laterality Date  . Cesarean section  2008   Social History   Social History  . Marital Status: Single    Spouse Name: N/A  . Number of Children: 1  . Years of Education: N/A   Occupational History  . [employer]    Social History Main Topics  . Smoking status: Never Smoker   . Smokeless tobacco: Never Used       Comment: tobacco use - no  . Alcohol Use: Yes     Comment: social - maybe 3 glasses of wine/month   . Drug Use: No  . Sexual Activity: Not Asked   Other Topics Concern  . None   Social History Narrative   Lives in [place] with parents. Has one child.      Single, full time RN.     Review of Systems  Constitutional: Positive for fatigue. Negative for fever, chills, appetite change and unexpected weight change.  Eyes: Negative for visual disturbance.  Respiratory: Negative for shortness of breath.   Cardiovascular: Negative for chest pain and leg swelling.  Gastrointestinal: Negative for nausea, vomiting, abdominal pain, diarrhea and constipation.  Musculoskeletal: Negative for myalgias and arthralgias.  Skin: Negative for color change and rash.  Neurological: Positive for weakness. Negative for dizziness, light-headedness and numbness.  Hematological: Negative for adenopathy. Does not bruise/bleed easily.  Psychiatric/Behavioral: Positive for dysphoric mood and decreased concentration. Negative for suicidal ideas and sleep disturbance. The patient is nervous/anxious.        Objective:    BP 124/79 mmHg  Pulse 97  Temp(Src) 98.8 F (37.1 C) (Oral)  Ht 5\' 5"  (1.651 m)  Wt 170 lb 6 oz (77.282 kg)  BMI 28.35 kg/m2  SpO2  100%  LMP 06/09/2015 Physical Exam  Constitutional: She is oriented to person, place, and time. She appears well-developed and well-nourished. No distress.  HENT:  Head: Normocephalic and atraumatic.  Right Ear: External ear normal.  Left Ear: External ear normal.  Nose: Nose normal.  Mouth/Throat: Oropharynx is clear and moist. No oropharyngeal exudate.  Eyes: Conjunctivae and EOM are normal. Pupils are equal, round, and reactive to light. Right eye exhibits no discharge.  Neck: Normal range of motion. Neck supple. No thyromegaly present.  Cardiovascular: Normal rate, regular rhythm, normal heart sounds and intact distal pulses.  Exam reveals no  gallop and no friction rub.   No murmur heard. Pulmonary/Chest: Effort normal. No respiratory distress. She has no wheezes. She has no rales.  Abdominal: Soft. Bowel sounds are normal. She exhibits no distension and no mass. There is no tenderness. There is no rebound and no guarding.  Musculoskeletal: Normal range of motion. She exhibits no edema or tenderness.  Lymphadenopathy:    She has no cervical adenopathy.  Neurological: She is alert and oriented to person, place, and time. No cranial nerve deficit. Coordination normal.  Skin: Skin is warm and dry. No rash noted. She is not diaphoretic. No erythema. No pallor.  Psychiatric: Her speech is normal and behavior is normal. Judgment and thought content normal. She exhibits a depressed mood. She expresses no suicidal ideation.          Assessment & Plan:   Problem List Items Addressed This Visit      Unprioritized   Chronic fatigue    Extreme generalized fatigue likely multifactorial with MS, anxiety and depression, work and home stressors. Given her h/o chronic sinus infections, and snoring, question if she may have sleep apnea which is contributing. Will set up home sleep study for evaluation. Reviewed labs which show normal blood counts, B12, and thyroid function. Encouraged her to take time for herself.      Relevant Orders   Ambulatory referral to Sleep Studies   Relapsing remitting multiple sclerosis ([place])    Reviewed recent labs from her neurologist. Will look into support groups and programs for her locally. Continue Tysabri and Adderall per neurology.      Routine general medical examination at a health care facility - Primary    General medical exam normal today. Breast and pelvic exam deferred as scheduled with OB next week. Labs reviewed. Immunizations UTD. Encouraged healthy diet and exercise.          Return in about 4 weeks (around 07/16/2015) for Physical, Recheck.

## 2015-06-18 NOTE — Progress Notes (Signed)
Pre visit review using our clinic review tool, if applicable. No additional management support is needed unless otherwise documented below in the visit note. 

## 2015-06-18 NOTE — Patient Instructions (Signed)
Health Maintenance, Female Adopting a healthy lifestyle and getting preventive care can go a long way to promote health and wellness. Talk with your health care provider about what schedule of regular examinations is right for you. This is a good chance for you to check in with your provider about disease prevention and staying healthy. In between checkups, there are plenty of things you can do on your own. Experts have done a lot of research about which lifestyle changes and preventive measures are most likely to keep you healthy. Ask your health care provider for more information. WEIGHT AND DIET  Eat a healthy diet  Be sure to include plenty of vegetables, fruits, low-fat dairy products, and lean protein.  Do not eat a lot of foods high in solid fats, added sugars, or salt.  Get regular exercise. This is one of the most important things you can do for your health.  Most adults should exercise for at least 150 minutes each week. The exercise should increase your heart rate and make you sweat (moderate-intensity exercise).  Most adults should also do strengthening exercises at least twice a week. This is in addition to the moderate-intensity exercise.  Maintain a healthy weight  Body mass index (BMI) is a measurement that can be used to identify possible weight problems. It estimates body fat based on height and weight. Your health care provider can help determine your BMI and help you achieve or maintain a healthy weight.  For females 20 years of age and older:   A BMI below 18.5 is considered underweight.  A BMI of 18.5 to 24.9 is normal.  A BMI of 25 to 29.9 is considered overweight.  A BMI of 30 and above is considered obese.  Watch levels of cholesterol and blood lipids  You should start having your blood tested for lipids and cholesterol at 33 years of age, then have this test every 5 years.  You may need to have your cholesterol levels checked more often if:  Your lipid  or cholesterol levels are high.  You are older than 33 years of age.  You are at high risk for heart disease.  CANCER SCREENING   Lung Cancer  Lung cancer screening is recommended for adults 55-80 years old who are at high risk for lung cancer because of a history of smoking.  A yearly low-dose CT scan of the lungs is recommended for people who:  Currently smoke.  Have quit within the past 15 years.  Have at least a 30-pack-year history of smoking. A pack year is smoking an average of one pack of cigarettes a day for 1 year.  Yearly screening should continue until it has been 15 years since you quit.  Yearly screening should stop if you develop a health problem that would prevent you from having lung cancer treatment.  Breast Cancer  Practice breast self-awareness. This means understanding how your breasts normally appear and feel.  It also means doing regular breast self-exams. Let your health care provider know about any changes, no matter how small.  If you are in your 20s or 30s, you should have a clinical breast exam (CBE) by a health care provider every 1-3 years as part of a regular health exam.  If you are 40 or older, have a CBE every year. Also consider having a breast X-ray (mammogram) every year.  If you have a family history of breast cancer, talk to your health care provider about genetic screening.  If you   are at high risk for breast cancer, talk to your health care provider about having an MRI and a mammogram every year.  Breast cancer gene (BRCA) assessment is recommended for women who have family members with BRCA-related cancers. BRCA-related cancers include:  Breast.  Ovarian.  Tubal.  Peritoneal cancers.  Results of the assessment will determine the need for genetic counseling and BRCA1 and BRCA2 testing. Cervical Cancer Your health care provider may recommend that you be screened regularly for cancer of the pelvic organs (ovaries, uterus, and  vagina). This screening involves a pelvic examination, including checking for microscopic changes to the surface of your cervix (Pap test). You may be encouraged to have this screening done every 3 years, beginning at age 21.  For women ages 30-65, health care providers may recommend pelvic exams and Pap testing every 3 years, or they may recommend the Pap and pelvic exam, combined with testing for human papilloma virus (HPV), every 5 years. Some types of HPV increase your risk of cervical cancer. Testing for HPV may also be done on women of any age with unclear Pap test results.  Other health care providers may not recommend any screening for nonpregnant women who are considered low risk for pelvic cancer and who do not have symptoms. Ask your health care provider if a screening pelvic exam is right for you.  If you have had past treatment for cervical cancer or a condition that could lead to cancer, you need Pap tests and screening for cancer for at least 20 years after your treatment. If Pap tests have been discontinued, your risk factors (such as having a new sexual partner) need to be reassessed to determine if screening should resume. Some women have medical problems that increase the chance of getting cervical cancer. In these cases, your health care provider may recommend more frequent screening and Pap tests. Colorectal Cancer  This type of cancer can be detected and often prevented.  Routine colorectal cancer screening usually begins at 33 years of age and continues through 33 years of age.  Your health care provider may recommend screening at an earlier age if you have risk factors for colon cancer.  Your health care provider may also recommend using home test kits to check for hidden blood in the stool.  A small camera at the end of a tube can be used to examine your colon directly (sigmoidoscopy or colonoscopy). This is done to check for the earliest forms of colorectal  cancer.  Routine screening usually begins at age 50.  Direct examination of the colon should be repeated every 5-10 years through 33 years of age. However, you may need to be screened more often if early forms of precancerous polyps or small growths are found. Skin Cancer  Check your skin from head to toe regularly.  Tell your health care provider about any new moles or changes in moles, especially if there is a change in a mole's shape or color.  Also tell your health care provider if you have a mole that is larger than the size of a pencil eraser.  Always use sunscreen. Apply sunscreen liberally and repeatedly throughout the day.  Protect yourself by wearing long sleeves, pants, a wide-brimmed hat, and sunglasses whenever you are outside. HEART DISEASE, DIABETES, AND HIGH BLOOD PRESSURE   High blood pressure causes heart disease and increases the risk of stroke. High blood pressure is more likely to develop in:  People who have blood pressure in the high end   of the normal range (130-139/85-89 mm Hg).  People who are overweight or obese.  People who are African American.  If you are 38-23 years of age, have your blood pressure checked every 3-5 years. If you are 61 years of age or older, have your blood pressure checked every year. You should have your blood pressure measured twice--once when you are at a hospital or clinic, and once when you are not at a hospital or clinic. Record the average of the two measurements. To check your blood pressure when you are not at a hospital or clinic, you can use:  An automated blood pressure machine at a pharmacy.  A home blood pressure monitor.  If you are between 45 years and 39 years old, ask your health care provider if you should take aspirin to prevent strokes.  Have regular diabetes screenings. This involves taking a blood sample to check your fasting blood sugar level.  If you are at a normal weight and have a low risk for diabetes,  have this test once every three years after 33 years of age.  If you are overweight and have a high risk for diabetes, consider being tested at a younger age or more often. PREVENTING INFECTION  Hepatitis B  If you have a higher risk for hepatitis B, you should be screened for this virus. You are considered at high risk for hepatitis B if:  You were born in a country where hepatitis B is common. Ask your health care provider which countries are considered high risk.  Your parents were born in a high-risk country, and you have not been immunized against hepatitis B (hepatitis B vaccine).  You have HIV or AIDS.  You use needles to inject street drugs.  You live with someone who has hepatitis B.  You have had sex with someone who has hepatitis B.  You get hemodialysis treatment.  You take certain medicines for conditions, including cancer, organ transplantation, and autoimmune conditions. Hepatitis C  Blood testing is recommended for:  Everyone born from [birth year] through 1964.  Anyone with known risk factors for hepatitis C. Sexually transmitted infections (STIs)  You should be screened for sexually transmitted infections (STIs) including gonorrhea and chlamydia if:  You are sexually active and are younger than 33 years of age.  You are older than 33 years of age and your health care provider tells you that you are at risk for this type of infection.  Your sexual activity has changed since you were last screened and you are at an increased risk for chlamydia or gonorrhea. Ask your health care provider if you are at risk.  If you do not have HIV, but are at risk, it may be recommended that you take a prescription medicine daily to prevent HIV infection. This is called pre-exposure prophylaxis (PrEP). You are considered at risk if:  You are sexually active and do not regularly use condoms or know the HIV status of your partner(s).  You take drugs by injection.  You are sexually  active with a partner who has HIV. Talk with your health care provider about whether you are at high risk of being infected with HIV. If you choose to begin PrEP, you should first be tested for HIV. You should then be tested every 3 months for as long as you are taking PrEP.  PREGNANCY   If you are premenopausal and you may become pregnant, ask your health care provider about preconception counseling.  If you may  become pregnant, take 400 to 800 micrograms (mcg) of folic acid every day.  If you want to prevent pregnancy, talk to your health care provider about birth control (contraception). OSTEOPOROSIS AND MENOPAUSE   Osteoporosis is a disease in which the bones lose minerals and strength with aging. This can result in serious bone fractures. Your risk for osteoporosis can be identified using a bone density scan.  If you are 61 years of age or older, or if you are at risk for osteoporosis and fractures, ask your health care provider if you should be screened.  Ask your health care provider whether you should take a calcium or vitamin D supplement to lower your risk for osteoporosis.  Menopause may have certain physical symptoms and risks.  Hormone replacement therapy may reduce some of these symptoms and risks. Talk to your health care provider about whether hormone replacement therapy is right for you.  HOME CARE INSTRUCTIONS   Schedule regular health, dental, and eye exams.  Stay current with your immunizations.   Do not use any tobacco products including cigarettes, chewing tobacco, or electronic cigarettes.  If you are pregnant, do not drink alcohol.  If you are breastfeeding, limit how much and how often you drink alcohol.  Limit alcohol intake to no more than 1 drink per day for nonpregnant women. One drink equals 12 ounces of beer, 5 ounces of wine, or 1 ounces of hard liquor.  Do not use street drugs.  Do not share needles.  Ask your health care provider for help if  you need support or information about quitting drugs.  Tell your health care provider if you often feel depressed.  Tell your health care provider if you have ever been abused or do not feel safe at home.   This information is not intended to replace advice given to you by your health care provider. Make sure you discuss any questions you have with your health care provider.   Document Released: 01/11/2011 Document Revised: 07/19/2014 Document Reviewed: 05/30/2013 Elsevier Interactive Patient Education [employer].

## 2015-06-18 NOTE — Assessment & Plan Note (Signed)
General medical exam normal today. Breast and pelvic exam deferred as scheduled with OB next week. Labs reviewed. Immunizations UTD. Encouraged healthy diet and exercise.

## 2015-06-18 NOTE — Assessment & Plan Note (Signed)
Extreme generalized fatigue likely multifactorial with MS, anxiety and depression, work and home stressors. Given her h/o chronic sinus infections, and snoring, question if she may have sleep apnea which is contributing. Will set up home sleep study for evaluation. Reviewed labs which show normal blood counts, B12, and thyroid function. Encouraged her to take time for herself.

## 2015-06-19 ENCOUNTER — Telehealth: Payer: Self-pay | Admitting: Internal Medicine

## 2015-06-19 ENCOUNTER — Encounter: Payer: Self-pay | Admitting: *Deleted

## 2015-06-19 NOTE — Telephone Encounter (Signed)
Can you give pt this information on MS resources? I got this from Dr. Carles Collet.  10:00 AM EST - 11:30 AM EST  Recurring Event  Second Thursday each month  In [place], [place] [address]    This group meets March through June and September through December.    She could call the [employer]. They are a pretty rich source of information at:  [phone]. Also I think that [place] has a exercise MS group that meets at the aquatic center.

## 2015-06-19 NOTE — Telephone Encounter (Signed)
Sent pt message by [employer] with information

## 2015-07-09 ENCOUNTER — Inpatient Hospital Stay: Payer: BLUE CROSS/BLUE SHIELD

## 2015-07-10 ENCOUNTER — Telehealth: Payer: Self-pay | Admitting: Internal Medicine

## 2015-07-10 NOTE — Telephone Encounter (Signed)
Sleep study was normal. 

## 2015-07-18 ENCOUNTER — Ambulatory Visit: Payer: Managed Care, Other (non HMO) | Admitting: Internal Medicine

## 2015-08-06 ENCOUNTER — Inpatient Hospital Stay: Payer: BLUE CROSS/BLUE SHIELD

## 2016-05-20 ENCOUNTER — Encounter
Admission: RE | Disposition: A | Discharge status: Home / Self Care | Payer: Self-pay | Source: Ambulatory Visit | Attending: Otolaryngology

## 2016-05-20 ENCOUNTER — Ambulatory Visit
Admission: RE | Admit: 2016-05-20 | Discharge: 2016-05-20 | Disposition: A | Discharge status: Home / Self Care | Payer: No Typology Code available for payment source | Source: Ambulatory Visit | Attending: Otolaryngology | Admitting: Otolaryngology

## 2016-05-20 ENCOUNTER — Ambulatory Visit: Payer: No Typology Code available for payment source | Admitting: Anesthesiology

## 2016-05-20 DIAGNOSIS — G35 Multiple sclerosis: Secondary | ICD-10-CM | POA: Diagnosis not present

## 2016-05-20 DIAGNOSIS — J3501 Chronic tonsillitis: Secondary | ICD-10-CM | POA: Insufficient documentation

## 2016-05-20 DIAGNOSIS — Z79899 Other long term (current) drug therapy: Secondary | ICD-10-CM | POA: Diagnosis not present

## 2016-05-20 HISTORY — PX: TONSILLECTOMY: SHX5217

## 2016-05-20 SURGERY — TONSILLECTOMY
Anesthesia: General | Site: Throat | Wound class: Clean Contaminated

## 2016-05-20 MED ORDER — PROMETHAZINE HCL 25 MG/ML IJ SOLN: 6.2500 mg | INTRAMUSCULAR | Status: DC | PRN

## 2016-05-20 MED ORDER — PROPOFOL 10 MG/ML IV BOLUS
INTRAVENOUS | Status: DC | PRN
  Administered 2016-05-20: 200 mg via INTRAVENOUS

## 2016-05-20 MED ORDER — OXYCODONE HCL 5 MG/5ML PO SOLN
5.0000 mg | Freq: Once | ORAL | Status: AC | PRN
  Administered 2016-05-20: 5 mg via ORAL

## 2016-05-20 MED ORDER — FENTANYL CITRATE (PF) 100 MCG/2ML IJ SOLN
INTRAMUSCULAR | Status: DC | PRN
  Administered 2016-05-20: 100 ug via INTRAVENOUS

## 2016-05-20 MED ORDER — DEXAMETHASONE SODIUM PHOSPHATE 4 MG/ML IJ SOLN
INTRAMUSCULAR | Status: DC | PRN
  Administered 2016-05-20: 10 mg via INTRAVENOUS

## 2016-05-20 MED ORDER — LACTATED RINGERS IV SOLN
INTRAVENOUS | Status: DC
  Administered 2016-05-20: 07:00:00 via INTRAVENOUS

## 2016-05-20 MED ORDER — MEPERIDINE HCL 25 MG/ML IJ SOLN: 6.2500 mg | INTRAMUSCULAR | Status: DC | PRN

## 2016-05-20 MED ORDER — OXYCODONE HCL 5 MG PO TABS: 5.0000 mg | ORAL_TABLET | Freq: Once | ORAL | Status: AC | PRN

## 2016-05-20 MED ORDER — ONDANSETRON HCL 4 MG/2ML IJ SOLN
INTRAMUSCULAR | Status: DC | PRN
  Administered 2016-05-20: 4 mg via INTRAVENOUS

## 2016-05-20 MED ORDER — HYDROMORPHONE HCL 1 MG/ML IJ SOLN
0.2500 mg | INTRAMUSCULAR | Status: DC | PRN
  Administered 2016-05-20: 0.25 mg via INTRAVENOUS
  Administered 2016-05-20: 0.5 mg via INTRAVENOUS
  Administered 2016-05-20: 0.25 mg via INTRAVENOUS

## 2016-05-20 MED ORDER — SUCCINYLCHOLINE CHLORIDE 20 MG/ML IJ SOLN
INTRAMUSCULAR | Status: DC | PRN
  Administered 2016-05-20: 100 mg via INTRAVENOUS

## 2016-05-20 MED ORDER — ACETAMINOPHEN 10 MG/ML IV SOLN
1000.0000 mg | Freq: Once | INTRAVENOUS | Status: AC
  Administered 2016-05-20: 1000 mg via INTRAVENOUS

## 2016-05-20 MED ORDER — SCOPOLAMINE 1 MG/3DAYS TD PT72
1.0000 | MEDICATED_PATCH | TRANSDERMAL | Status: DC
  Administered 2016-05-20: 1.5 mg via TRANSDERMAL

## 2016-05-20 MED ORDER — MIDAZOLAM HCL 5 MG/5ML IJ SOLN
INTRAMUSCULAR | Status: DC | PRN
  Administered 2016-05-20: 2 mg via INTRAVENOUS

## 2016-05-20 MED ORDER — LIDOCAINE HCL (CARDIAC) 20 MG/ML IV SOLN
INTRAVENOUS | Status: DC | PRN
  Administered 2016-05-20: 50 mg via INTRAVENOUS

## 2016-05-20 SURGICAL SUPPLY — 12 items
CANISTER SUCT 1200ML W/VALVE (MISCELLANEOUS) ×3 IMPLANT
ELECT CAUTERY BLADE TIP 2.5 (TIP) ×3
ELECTRODE CAUTERY BLDE TIP 2.5 (TIP) ×1 IMPLANT
GLOVE PI ULTRA LF STRL 7.5 (GLOVE) ×2 IMPLANT
GLOVE PI ULTRA NON LATEX 7.5 (GLOVE) ×4
KIT ROOM TURNOVER OR (KITS) ×3 IMPLANT
PACK TONSIL/ADENOIDS (PACKS) ×3 IMPLANT
PAD GROUND ADULT SPLIT (MISCELLANEOUS) ×3 IMPLANT
PENCIL ELECTRO HAND CTR (MISCELLANEOUS) ×3 IMPLANT
SOL ANTI-FOG 6CC FOG-OUT (MISCELLANEOUS) ×1 IMPLANT
SOL FOG-OUT ANTI-FOG 6CC (MISCELLANEOUS) ×2
STRAP BODY AND KNEE 60X3 (MISCELLANEOUS) ×3 IMPLANT

## 2016-05-20 NOTE — Anesthesia Procedure Notes (Signed)
Procedure Name: Intubation Date/Time: 05/20/2016 7:31 AM Performed by: Londell Moh Pre-anesthesia Checklist: Patient identified, Emergency Drugs available, Suction available, Patient being monitored and Timeout performed Patient Re-evaluated:Patient Re-evaluated prior to inductionOxygen Delivery Method: Circle system utilized Preoxygenation: Pre-oxygenation with 100% oxygen Intubation Type: IV induction Ventilation: Mask ventilation without difficulty Laryngoscope Size: Mac and 3 Grade View: Grade II Tube type: Oral Rae Tube size: 7.0 mm Number of attempts: 1 Airway Equipment and Method: Stylet Placement Confirmation: ETT inserted through vocal cords under direct vision,  positive ETCO2 and breath sounds checked- equal and bilateral Tube secured with: Tape Dental Injury: Teeth and Oropharynx as per pre-operative assessment

## 2016-05-20 NOTE — Anesthesia Preprocedure Evaluation (Signed)
Anesthesia Evaluation  Patient identified by MRN, date of birth, ID band Patient awake    Reviewed: Allergy & Precautions, NPO status , Patient's Chart, lab work & pertinent test results  Airway Mallampati: II  TM Distance: >3 FB Neck ROM: Full    Dental no notable dental hx.    Pulmonary    Pulmonary exam normal        Cardiovascular negative cardio ROS Normal cardiovascular exam     Neuro/Psych  Headaches, PSYCHIATRIC DISORDERS MS    GI/Hepatic negative GI ROS, Neg liver ROS,   Endo/Other  negative endocrine ROS  Renal/GU negative Renal ROS  negative genitourinary   Musculoskeletal negative musculoskeletal ROS (+)   Abdominal   Peds  Hematology negative hematology ROS (+)   Anesthesia Other Findings   Reproductive/Obstetrics negative OB ROS                             Anesthesia Physical Anesthesia Plan  ASA: II  Anesthesia Plan: General   Post-op Pain Management:    Induction: Intravenous  Airway Management Planned: Oral ETT  Additional Equipment:   Intra-op Plan:   Post-operative Plan:   Informed Consent: I have reviewed the patients History and Physical, chart, labs and discussed the procedure including the risks, benefits and alternatives for the proposed anesthesia with the patient or authorized representative who has indicated his/her understanding and acceptance.     Plan Discussed with: CRNA  Anesthesia Plan Comments:         Anesthesia Quick Evaluation

## 2016-05-20 NOTE — Discharge Instructions (Signed)
T & A INSTRUCTION SHEET - [facility] °[facility] ° °CREIGHTON VAUGHT, MD °PAUL H. JUENGEL, MD  °P. [facility] °CHAPMAN MCQUEEN, MD ° °[address] [place], [place] [address] TEL. [phone] °[address] [place] [address] [phone] ° °INFORMATION SHEET FOR A TONSILLECTOMY AND ADENDOIDECTOMY ° °About Your Tonsils and Adenoids ° The tonsils and adenoids are normal body tissues that are part of our immune system.  They normally help to protect us against diseases that may enter our mouth and nose.  However, sometimes the tonsils and/or adenoids become too large and obstruct our breathing, especially at night. °  ° If either of these things happen it helps to remove the tonsils and adenoids in order to become healthier. The operation to remove the tonsils and adenoids is called a tonsillectomy and adenoidectomy. ° °The Location of Your Tonsils and Adenoids ° The tonsils are located in the back of the throat on both side and sit in a cradle of muscles. The adenoids are located in the roof of the mouth, behind the nose, and closely associated with the opening of the Eustachian tube to the ear. ° °Surgery on Tonsils and Adenoids ° A tonsillectomy and adenoidectomy is a short operation which takes about thirty minutes.  This includes being put to sleep and being awakened.  Tonsillectomies and adenoidectomies are performed at [facility] and may require observation period in the recovery room prior to going home. ° °Following the Operation for a Tonsillectomy ° A cautery machine is used to control bleeding.  Bleeding from a tonsillectomy and adenoidectomy is minimal and postoperatively the risk of bleeding is approximately four percent, although this rarely life threatening. ° ° ° °After your tonsillectomy and adenoidectomy post-op care at home: ° °1. Our patients are able to go home the same day.  You may be given prescriptions for pain  medications and antibiotics, if indicated. °2. It is extremely important to remember that fluid intake is of utmost importance after a tonsillectomy.  The amount that you drink must be maintained in the postoperative period.  A good indication of whether a child is getting enough fluid is whether his/her urine output is constant.  As long as children are urinating or wetting their diaper every 6 - 8 hours this is usually enough fluid intake.   °3. Although rare, this is a risk of some bleeding in the first ten days after surgery.  This is usually occurs between day five and nine postoperatively.  This risk of bleeding is approximately four percent.  If you or your child should have any bleeding you should remain calm and notify our office or go directly to the Emergency Room at [facility] where they will contact us. Our doctors are available seven days a week for notification.  We recommend sitting up quietly in a chair, place an ice pack on the front of the neck and spitting out the blood gently until we are able to contact you.  Adults should gargle gently with ice water and this may help stop the bleeding.  If the bleeding does not stop after a short time, i.e. 10 to 15 minutes, or seems to be increasing again, please contact us or go to the hospital.   °4. It is common for the pain to be worse at 5 - 7 days postoperatively.  This occurs because the “scab” is peeling off and the mucous membrane (skin of   the throat) is growing back where the tonsils were.   °5. It is common for a low-grade fever, less than 102, during the first week after a tonsillectomy and adenoidectomy.  It is usually due to not drinking enough liquids, and we suggest your use liquid Tylenol or the pain medicine with Tylenol prescribed in order to keep your temperature below 102.  Please follow the directions on the back of the bottle. °6. Do not take aspirin or any products that contain aspirin such as Bufferin, Anacin,  Ecotrin, aspirin gum, Goodies, BC headache powders, etc., after a T&A because it can promote bleeding.  Please check with our office before administering any other medication that may been prescribed by other doctors during the two week post-operative period. °7. If you happen to look in the mirror or into your child’s mouth you will see white/gray patches on the back of the throat.  This is what a scab looks like in the mouth and is normal after having a T&A.  It will disappear once the tonsil area heals completely. However, it may cause a noticeable odor, and this too will disappear with time.     °8. You or your child may experience ear pain after having a T&A.  This is called referred pain and comes from the throat, but it is felt in the ears.  Ear pain is quite common and expected.  It will usually go away after ten days.  There is usually nothing wrong with the ears, and it is primarily due to the healing area stimulating the nerve to the ear that runs along the side of the throat.  Use either the prescribed pain medicine or Tylenol as needed.  °9. The throat tissues after a tonsillectomy are obviously sensitive.  Smoking around children who have had a tonsillectomy significantly increases the risk of bleeding.  DO NOT SMOKE!  ° °General Anesthesia, Adult, Care After °Refer to this sheet in the next few weeks. These instructions provide you with information on caring for yourself after your procedure. Your health care provider may also give you more specific instructions. Your treatment has been planned according to current medical practices, but problems sometimes occur. Call your health care provider if you have any problems or questions after your procedure. °WHAT TO EXPECT AFTER THE PROCEDURE °After the procedure, it is typical to experience: °· Sleepiness. °· Nausea and vomiting. °HOME CARE INSTRUCTIONS °· For the first 24 hours after general anesthesia: °¨ Have a responsible person with you. °¨ Do not  drive a car. If you are alone, do not take public transportation. °¨ Do not drink alcohol. °¨ Do not take medicine that has not been prescribed by your health care provider. °¨ Do not sign important papers or make important decisions. °¨ You may resume a normal diet and activities as directed by your health care provider. °· Change bandages (dressings) as directed. °· If you have questions or problems that seem related to general anesthesia, call the hospital and ask for the anesthetist or anesthesiologist on call. °SEEK MEDICAL CARE IF: °· You have nausea and vomiting that continue the day after anesthesia. °· You develop a rash. °SEEK IMMEDIATE MEDICAL CARE IF:  °· You have difficulty breathing. °· You have chest pain. °· You have any allergic problems. °  °This information is not intended to replace advice given to you by your health care provider. Make sure you discuss any questions you have with your health care provider. °  °Document   Released: 10/04/2000 Document Revised: 07/19/2014 Document Reviewed: 10/27/2011 °Elsevier Interactive Patient Education ©2016 Elsevier Inc. ° °

## 2016-05-20 NOTE — Transfer of Care (Signed)
Immediate Anesthesia Transfer of Care Note  Patient: Shannon Davidson  Procedure(s) Performed: Procedure(s): TONSILLECTOMY (N/A)  Patient Location: PACU  Anesthesia Type: General  Level of Consciousness: awake, alert  and patient cooperative  Airway and Oxygen Therapy: Patient Spontanous Breathing and Patient connected to supplemental oxygen  Post-op Assessment: Post-op Vital signs reviewed, Patient's Cardiovascular Status Stable, Respiratory Function Stable, Patent Airway and No signs of Nausea or vomiting  Post-op Vital Signs: Reviewed and stable  Complications: No apparent anesthesia complications

## 2016-05-20 NOTE — Op Note (Signed)
05/20/2016  7:52 AM    Darlyne Russian  [id number]   Pre-Op Dx:  Chronic tonsillitis  Post-op Dx: Chronic tonsillitis  Proc: Tonsillectomy   Surg:  Lianah Peed H  Anes:  GOT  EBL:  30 mL  Comp:  None  Findings:  Buried cryptic tonsils with lots of debris  Procedure: The patient was given general anesthesia by oral endotracheal intubation. A Davis mouth gag was used to visualize the oropharynx. The soft palate was retracted to visualize the adenoids and these were not enlarged. The nasopharynx was clear. The tonsils were hidden behind the anterior tonsillar pillar and had deep crevices and him that were hiding debris on both sides. The tonsils were grasped and pulled medially and the anterior pillar was incised using electrocautery. Dissection of the tonsil was done with blunt dissection and electrocautery removing it from the tonsil bed. The right side was especially scarred down. Once both tonsils were removed bleeding was controlled with direct pressure and electrocautery. The stomach was suctioned with a soft suction catheter and it was completely empty.  The patient was awakened and taken to the recovery room in satisfactory condition. There were no operative complications.  Dispo:   To PACU to be discharged home  Plan:  To follow-up in the office in 2 weeks for reevaluation of the pharynx. Push fluids at home and use pain medication as needed.  Cressie Betzler H  05/20/2016 7:52 AM

## 2016-05-20 NOTE — H&P (Signed)
  H&P has been reviewed and no changes necessary. To be downloaded later. 

## 2016-05-20 NOTE — Anesthesia Postprocedure Evaluation (Signed)
Anesthesia Post Note  Patient: Shannon Davidson  Procedure(s) Performed: Procedure(s) (LRB): TONSILLECTOMY (N/A)  Patient location during evaluation: PACU Anesthesia Type: General Level of consciousness: awake and alert and oriented Pain management: pain level controlled Vital Signs Assessment: post-procedure vital signs reviewed and stable Respiratory status: spontaneous breathing and nonlabored ventilation Cardiovascular status: stable Postop Assessment: no signs of nausea or vomiting and adequate PO intake Anesthetic complications: no    Estill Batten

## 2016-05-21 ENCOUNTER — Encounter: Payer: Self-pay | Admitting: Otolaryngology

## 2016-05-24 ENCOUNTER — Emergency Department
Admission: EM | Admit: 2016-05-24 | Discharge: 2016-05-24 | Disposition: A | Discharge status: Home / Self Care | Payer: No Typology Code available for payment source | Attending: Emergency Medicine | Admitting: Emergency Medicine

## 2016-05-24 DIAGNOSIS — J029 Acute pharyngitis, unspecified: Secondary | ICD-10-CM | POA: Diagnosis present

## 2016-05-24 DIAGNOSIS — Z79899 Other long term (current) drug therapy: Secondary | ICD-10-CM | POA: Diagnosis not present

## 2016-05-24 DIAGNOSIS — G8918 Other acute postprocedural pain: Secondary | ICD-10-CM | POA: Diagnosis not present

## 2016-05-24 DIAGNOSIS — Z9089 Acquired absence of other organs: Secondary | ICD-10-CM | POA: Insufficient documentation

## 2016-05-24 LAB — SURGICAL PATHOLOGY

## 2016-05-24 MED ORDER — HYDROCODONE-ACETAMINOPHEN 7.5-325 MG/15ML PO SOLN: 10.0000 mL | Freq: Four times a day (QID) | ORAL | 0 refills | Status: AC | PRN

## 2016-05-24 MED ORDER — HYDROCODONE-ACETAMINOPHEN 7.5-325 MG/15ML PO SOLN
10.0000 mL | Freq: Once | ORAL | Status: AC
  Administered 2016-05-24: 10 mL via ORAL
  Filled 2016-05-24: qty 15

## 2016-05-24 MED ORDER — LIDOCAINE VISCOUS 2 % MT SOLN
15.0000 mL | Freq: Once | OROMUCOSAL | Status: AC
  Administered 2016-05-24: 15 mL via OROMUCOSAL
  Filled 2016-05-24: qty 15

## 2016-05-24 MED ORDER — KETOROLAC TROMETHAMINE 30 MG/ML IJ SOLN
15.0000 mg | INTRAMUSCULAR | Status: AC
  Administered 2016-05-24: 15 mg via INTRAVENOUS
  Filled 2016-05-24: qty 1

## 2016-05-24 MED ORDER — DEXAMETHASONE SODIUM PHOSPHATE 10 MG/ML IJ SOLN
10.0000 mg | Freq: Once | INTRAMUSCULAR | Status: AC
  Administered 2016-05-24: 10 mg via INTRAVENOUS
  Filled 2016-05-24: qty 1

## 2016-05-24 MED ORDER — LIDOCAINE VISCOUS 2 % MT SOLN: 20.0000 mL | OROMUCOSAL | 0 refills | Status: DC | PRN

## 2016-05-24 MED ORDER — FAMOTIDINE IN NACL 20-0.9 MG/50ML-% IV SOLN
20.0000 mg | Freq: Once | INTRAVENOUS | Status: AC
  Administered 2016-05-24: 20 mg via INTRAVENOUS
  Filled 2016-05-24: qty 50

## 2016-05-24 NOTE — ED Triage Notes (Signed)
Pt accompanied by family member, states that patient had tonsillectomy on Thursday and has had worsening pain and swelling after surgery.  Pt reports being out of pain medication from surgery and having difficulty swallowing.  Pt breathing non labored and ambulatory to triage.

## 2016-05-24 NOTE — ED Provider Notes (Signed)
[facility] Emergency Department Provider Note  ____________________________________________  Time seen: Approximately 7:35 AM  I have reviewed the triage vital signs and the nursing notes.   HISTORY  Chief Complaint Sore Throat and Post-op Problem    HPI Shannon Davidson is a 35 y.o. female who had a tonsillectomy by Dr. Kathyrn Sheriff 4 days ago who reports that today it feels like her throat is more swollen and painful. She did run out of her liquid hydrocodone yesterday. She reports difficulty swallowing due to the pain. No respiratory distress or shortness of breath. No fever or chills or neck stiffness.     Past Medical History:  Diagnosis Date  . HLD (hyperlipidemia)   . Kidney stone    Dr. Bernardo Heater  . MS (multiple sclerosis) ([place])   . Palpitations   . Pap smear for cervical cancer screening 2012   Dr. Benjie Karvonen  . PCOS (polycystic ovarian syndrome)   . Relapsing remitting multiple sclerosis ([place]) 11/25/2014     Patient Active Problem List   Diagnosis Date Noted  . Chronic fatigue 06/18/2015  . HSV infection 01/20/2015  . Chronic maxillary sinusitis 01/20/2015  . Relapsing remitting multiple sclerosis ([place]) 11/25/2014  . Routine general medical examination at a health care facility 06/17/2014  . Low back pain 06/17/2014  . Elevated LFTs 02/20/2014  . Arthralgia 02/20/2014  . Adjustment disorder with mixed anxiety and depressed mood 05/25/2013  . PCOS (polycystic ovarian syndrome) 09/09/2011  . Overweight (BMI 25.0-29.9) 06/08/2011  . HYPERLIPIDEMIA-MIXED 08/19/2009  . HEADACHE 08/19/2009  . PALPITATIONS 08/19/2009  . MURMUR 08/19/2009     Past Surgical History:  Procedure Laterality Date  . CESAREAN SECTION  2008  . TONSILLECTOMY N/A 05/20/2016   Procedure: TONSILLECTOMY;  Surgeon: Margaretha Sheffield, MD;  Location: [employer];  Service: ENT;  Laterality: N/A;     Prior to Admission medications   Medication Sig Start Date End Date  Taking? Authorizing Provider  ALPRAZolam (XANAX) 0.25 MG tablet Take 1 tablet (0.25 mg total) by mouth at bedtime as needed for anxiety. Patient not taking: Reported on 05/11/2016 01/20/15   Jackolyn Confer, MD  amphetamine-dextroamphetamine (ADDERALL) 15 MG tablet Take by mouth 2 (two) times daily as needed (for fatigue).     Historical Provider, MD  carbamazepine (TEGRETOL XR) 100 MG 12 hr tablet Take 100 mg by mouth 2 (two) times daily as needed (for TMJ).    Historical Provider, MD  Cholecalciferol (VITAMIN D3) 5000 UNITS CAPS Take 10,000 Units by mouth daily.     Historical Provider, MD  Coenzyme Q10-Fish Oil-Vit E (CO-Q 10 OMEGA-3 FISH OIL PO) Take 1 tablet by mouth.    Historical Provider, MD  cyclobenzaprine (FLEXERIL) 10 MG tablet Take 10 mg by mouth at bedtime as needed for muscle spasms.    Jackolyn Confer, MD  HYDROcodone-acetaminophen (HYCET) 7.5-325 mg/15 ml solution Take 10 mLs by mouth 4 (four) times daily as needed for moderate pain. 05/24/16 05/24/17  Carrie Mew, MD  lidocaine (XYLOCAINE) 2 % solution Use as directed 20 mLs in the mouth or throat every 2 (two) hours as needed for mouth pain. Gargle and spit out 05/24/16   Carrie Mew, MD  Melatonin 10 MG TABS Take 10 mg by mouth at bedtime.    Historical Provider, MD  Multiple Vitamins-Minerals (MULTIVITAMIN WOMEN PO) Take 1 tablet by mouth.    Historical Provider, MD  natalizumab (TYSABRI) 300 MG/15ML injection Inject 300 mg into the vein every 28 (twenty-eight) days.  Historical Provider, MD  ondansetron (ZOFRAN) 8 MG tablet Take 1 tablet (8 mg total) by mouth every 8 (eight) hours as needed for nausea or vomiting. Patient not taking: Reported on 05/11/2016 02/20/14   Jackolyn Confer, MD  Probiotic Product (PROBIOTIC-10 PO) Take 1 tablet by mouth.    Historical Provider, MD  valACYclovir (VALTREX) 500 MG tablet Take 1 tablet (500 mg total) by mouth 2 (two) times daily. Patient not taking: Reported on 05/11/2016  05/25/13   Jackolyn Confer, MD     Allergies Patient has no known allergies.   Family History  Problem Relation Age of Onset  . Heart disease Maternal Grandmother 60    s/p CABG  . Dementia Maternal Grandmother   . Hypertension Mother   . Seizures Father   . Hyperlipidemia Father   . Achalasia Sister   . COPD Maternal Grandfather   . Diabetes Paternal Grandmother     Social History Social History  Substance Use Topics  . Smoking status: Never Smoker  . Smokeless tobacco: Never Used     Comment: tobacco use - no  . Alcohol use Yes     Comment: social - maybe 3 glasses of wine/month     Review of Systems  Constitutional:   No fever or chills.  ENT:   Positive sore throat. Cardiovascular:   No chest pain. Respiratory:   No dyspnea or cough. Musculoskeletal:   Negative for focal pain or swelling Neurological:   Negative for headaches 10-point ROS otherwise negative.  ____________________________________________   PHYSICAL EXAM:  VITAL SIGNS: ED Triage Vitals  Enc Vitals Group     BP 05/24/16 0652 (!) 149/82     Pulse Rate 05/24/16 0652 80     Resp 05/24/16 0652 18     Temp 05/24/16 0652 98.1 F (36.7 C)     Temp Source 05/24/16 0652 Oral     SpO2 05/24/16 0652 99 %     Weight 05/24/16 0652 178 lb (80.7 kg)     Height 05/24/16 0652 5\' 5"  (1.651 m)     Head Circumference --      Peak Flow --      Pain Score 05/24/16 0653 10     Pain Loc --      Pain Edu? --      Excl. in [place]? --     Vital signs reviewed, nursing assessments reviewed.   Constitutional:   Alert and oriented. Well appearing and in no distress. Eyes:   No scleral icterus. No conjunctival pallor. PERRL. EOMI.  No nystagmus. ENT   Head:   Normocephalic and atraumatic.   Nose:   No congestion/rhinnorhea. No septal hematoma   Mouth/Throat:   MMM, Diffuse granulation tissue in the oropharynx, as expected after tonsillectomy. No bleeding. No focal swelling or asymmetry. Uvula  midline and nonedematous. No trismus   Neck:   No stridor. No SubQ emphysema. No meningismus. No pain or limitation of movement with flexion and extension of the neck to extremes of range of motion. Hematological/Lymphatic/Immunilogical:   No cervical lymphadenopathy. Cardiovascular:   RRR. Symmetric bilateral radial and DP pulses.  No murmurs.  Respiratory:   Normal respiratory effort without tachypnea nor retractions. Breath sounds are clear and equal bilaterally. No wheezes/rales/rhonchi. Neurologic:   Normal speech and language.  CN 2-10 normal. Motor grossly intact. No gross focal neurologic deficits are appreciated.  Skin:    Skin is warm, dry and intact. No rash noted.  No petechiae, purpura, or  bullae.  ____________________________________________    LABS (pertinent positives/negatives) (all labs ordered are listed, but only abnormal results are displayed) Labs Reviewed - No data to display ____________________________________________   EKG    ____________________________________________    RADIOLOGY    ____________________________________________   PROCEDURES Procedures  ____________________________________________   INITIAL IMPRESSION / ASSESSMENT AND PLAN / ED COURSE  Pertinent labs & imaging results that were available during my care of the patient were reviewed by me and considered in my medical decision making (see chart for details).  Patient presents with throat pain, with an expected clinical course after tonsillectomy. No evidence of RPA or PTA. No bleeding. No airway compromise. We'll treat symptomatically with Toradol and Decadron, viscous lidocaine and the previously prescribed liquid hydrocodone. For gastric protection with the side effects of these medications all give her Pepcid.   ----------------------------------------- 9:08 AM on 05/24/2016 -----------------------------------------  Feeling much better. Sitting upright, smiling,  drinking fluids, clear speech. We'll discharge home with a refill of the hydrocodone liquid solution as well as viscous lidocaine. Follow-up with Dr. Kathyrn Sheriff as needed. Controlled substance reporting system reviewed, no evidence of substance abuse.    Clinical Course    ____________________________________________   FINAL CLINICAL IMPRESSION(S) / ED DIAGNOSES  Final diagnoses:  Post-operative pain       Portions of this note were generated with dragon dictation software. Dictation errors may occur despite best attempts at proofreading.    Carrie Mew, MD 05/24/16 [phone]

## 2016-10-08 IMAGING — MR MRI CERVICAL SPINE WITHOUT AND WITH CONTRAST
9 series · 42 of 48 positions shown · IV contrast (multihance)
Comparison: None.

CLINICAL DATA: Numbness and tingling down the spine, torso, and
both legs when looking down or bending neck forward. Symptoms
present for 1 month.

EXAM:
MRI CERVICAL SPINE WITHOUT AND WITH CONTRAST
TECHNIQUE: Multiplanar and multiecho pulse sequences of the cervical spine, to
include the craniocervical junction and cervicothoracic junction,
were obtained according to standard protocol without and with
intravenous contrast.
CONTRAST:  16 mL MultiHance

[Series 3: T2 · sagittal · 3.0mm · 0.70mm/px · 4 of 15 slices shown (1 of 2)]
[im 1/15]
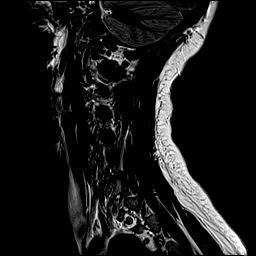
[im 5/15]
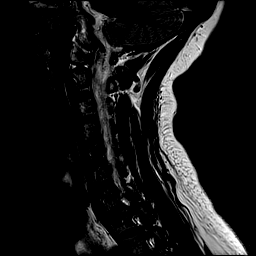
[im 10/15]
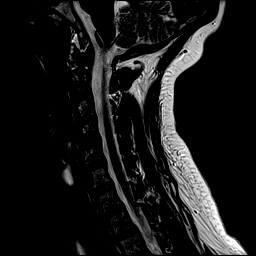
[im 15/15]
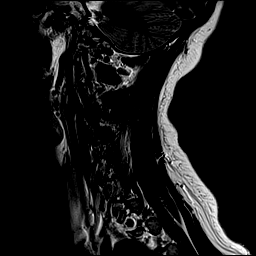

[Series 4: T1 · sagittal · 3.0mm · 0.70mm/px · 4 of 15 slices shown (1 of 2)]
[im 1/15]
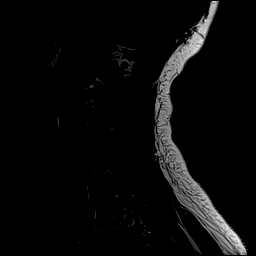
[im 5/15]
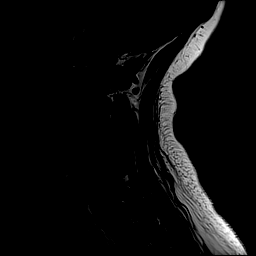
[im 10/15]
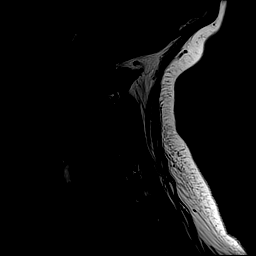
[im 15/15]
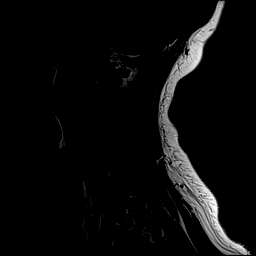

[Series 5: STIR · sagittal · 3.0mm · 0.78mm/px · 4 of 15 slices shown]
[im 1/15]
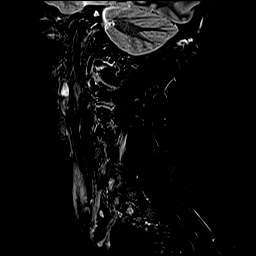
[im 5/15]
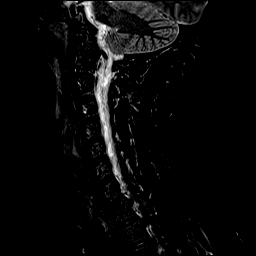
[im 10/15]
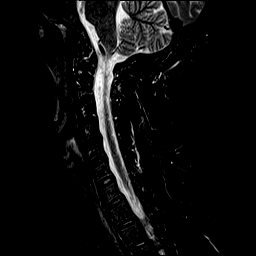
[im 15/15]
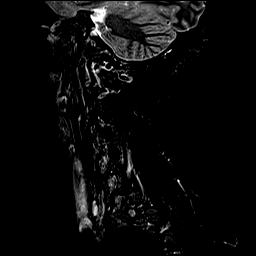

[Series 6: FLAIR · sagittal · 3.0mm · 0.35mm/px · 4 of 15 slices shown]
[im 1/15]
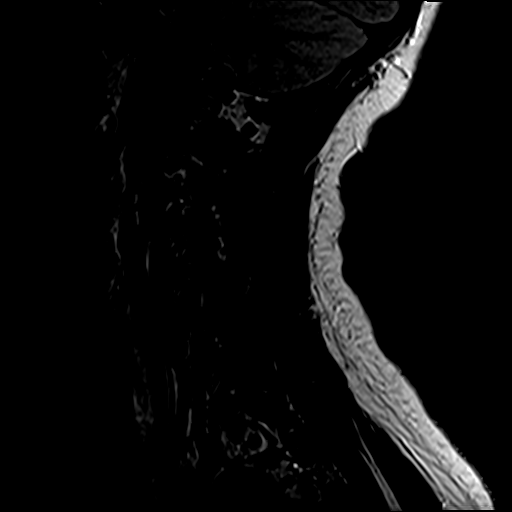
[im 5/15]
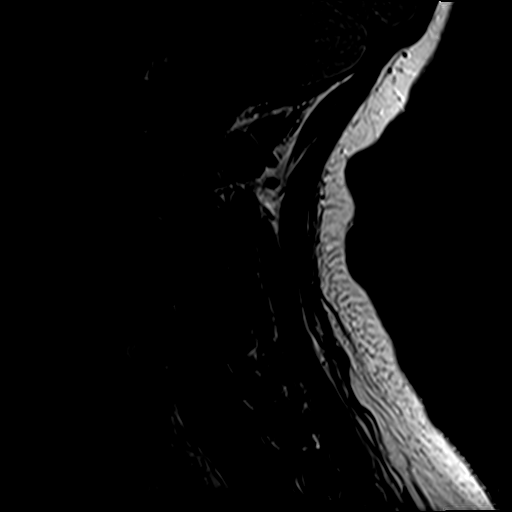
[im 10/15]
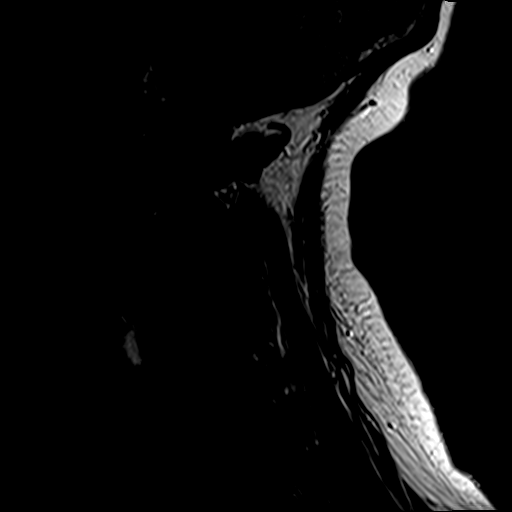
[im 15/15]
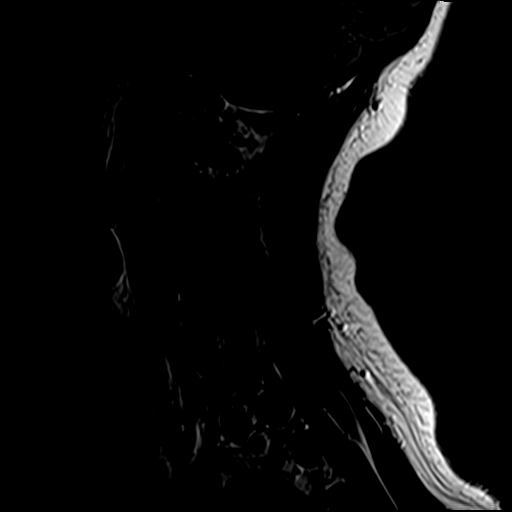

[Series 7: T2 · axial · 3.0mm · 0.70mm/px · z∈[-75,+22]mm · 4 of 14 slices shown (2 of 2)]
[im 1/14]
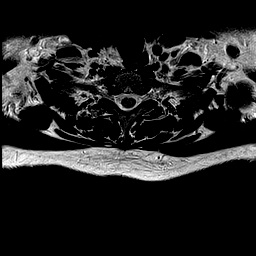
[im 5/14]
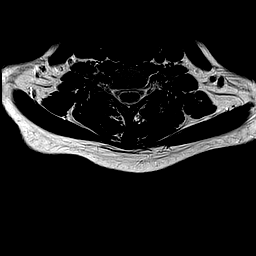
[im 9/14]
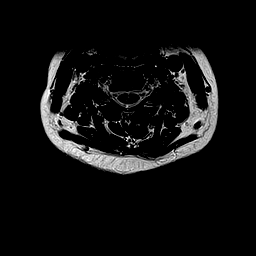
[im 14/14]
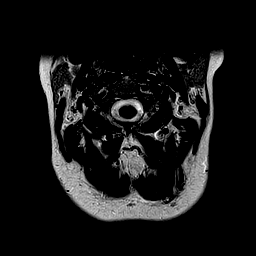

[Series 8: mpgr ax · axial · 3.0mm · 0.35mm/px · z∈[-75,-64]mm · 2 of 27 slices shown]
[im 1/27]
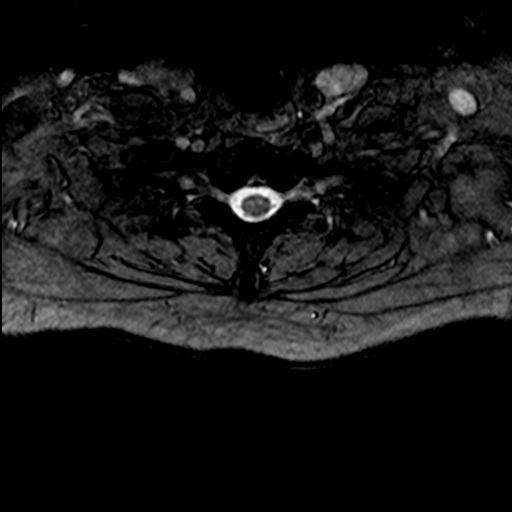
[im 4/27]
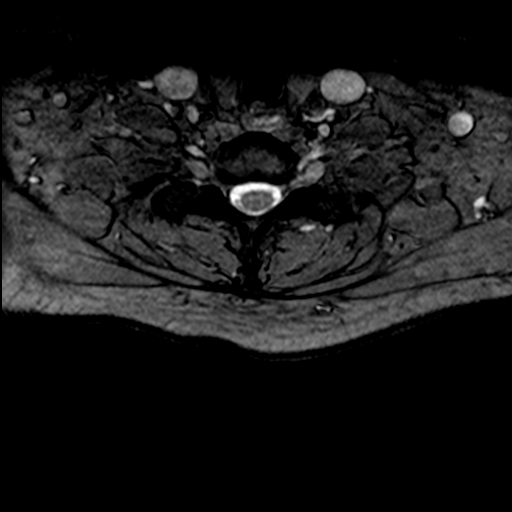

[Series 9: T1 · axial · 3.0mm · 0.56mm/px · z∈[-75,+22]mm · 8 of 27 slices shown (2 of 2)]
[im 1/27]
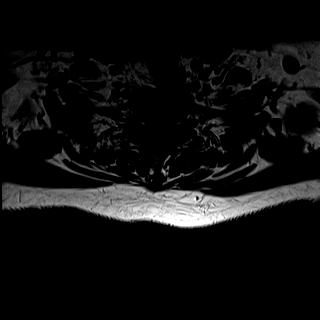
[im 4/27]
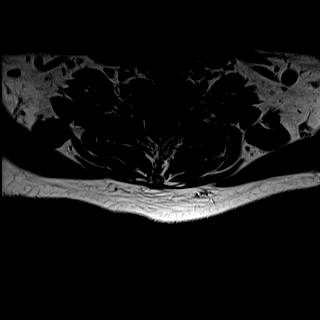
[im 8/27]
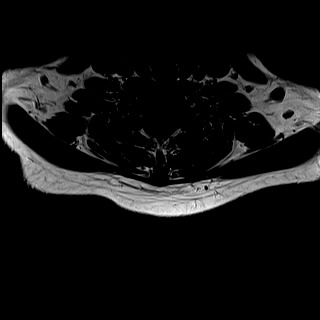
[im 12/27]
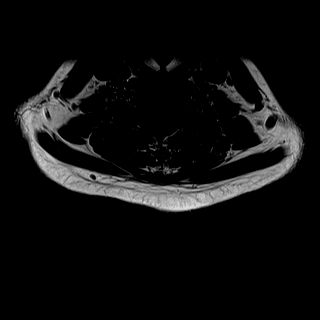
[im 15/27]
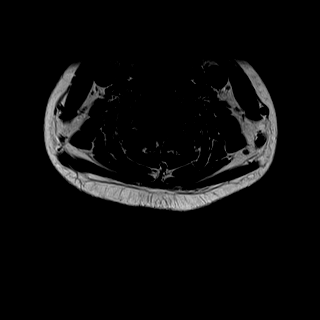
[im 19/27]
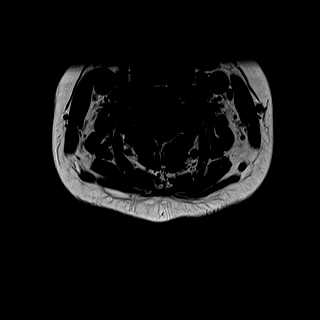
[im 23/27]
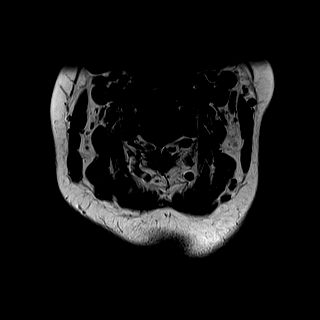
[im 27/27]
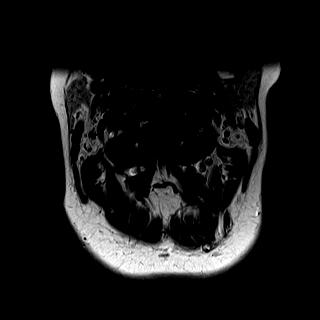

[Series 10: T1 fat-sat post-contrast · sagittal · 3.0mm · 0.70mm/px · 4 of 15 slices shown]
[im 1/15]
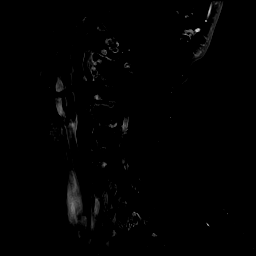
[im 5/15]
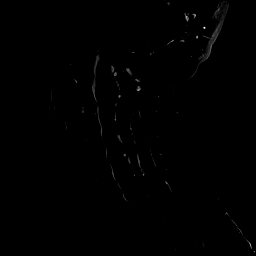
[im 10/15]
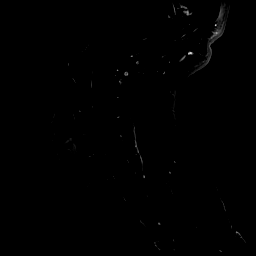
[im 15/15]
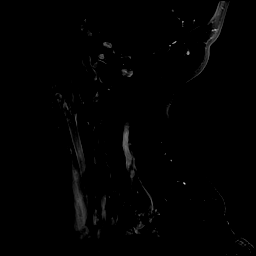

[Series 11: T1 post-contrast · axial · 3.0mm · 0.56mm/px · z∈[-75,+22]mm · 8 of 27 slices shown]
[im 1/27]
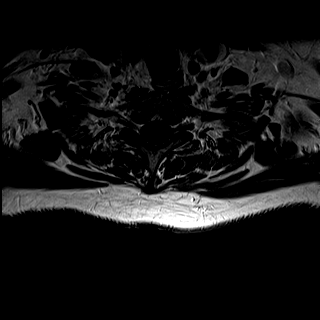
[im 4/27]
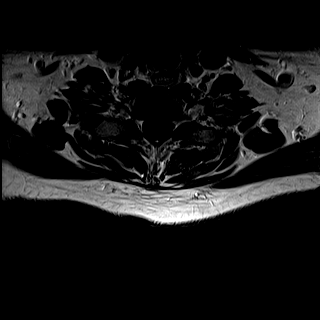
[im 8/27]
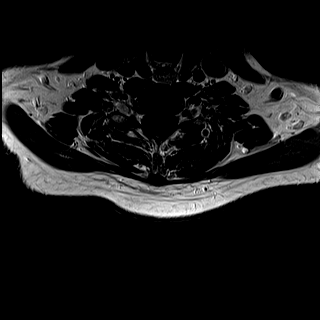
[im 12/27]
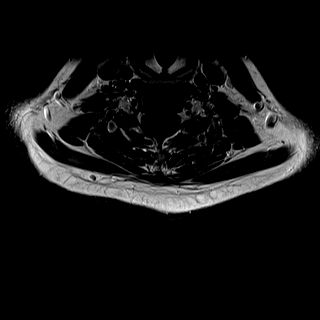
[im 15/27]
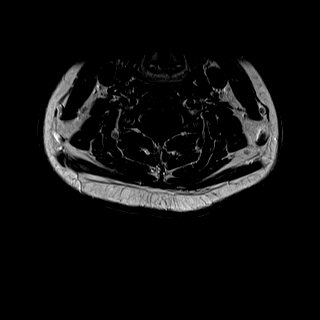
[im 19/27]
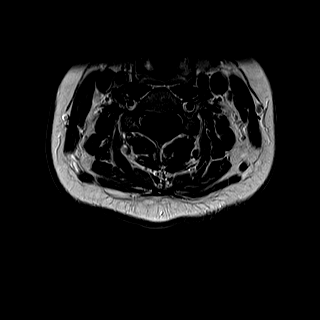
[im 23/27]
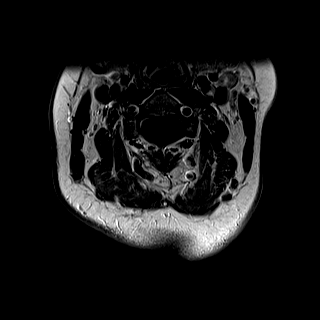
[im 27/27]
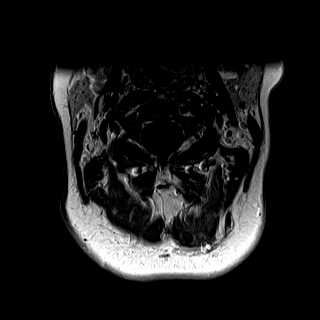

[42 of 48 positions shown; findings below may reference images not displayed]

FINDINGS: There is mild straightening of cervical spine. There is no
listhesis. Vertebral body heights are preserved. Minimal multilevel
disc desiccation is present. Intervertebral disc space heights are
relatively well preserved. Vertebral bone marrow signal is within
normal limits.

Cervical spinal cord is normal in caliber. There are 2 small foci of
T2 hyperintensity in the dorsal midline spinal cord at C2 and C3.
Faint T2 hyperintensity is questioned in the medulla. No definite
abnormal enhancement is identified. There is faintly increased
signal intensity in the spinal cord at C3 on sagittal T1
postcontrast images, however this is not confirmed on axial images
and is favored to be artifactual. No disc herniation, spinal
stenosis, or neural foraminal stenosis is identified. Paraspinal
soft tissues are unremarkable.
IMPRESSION: 1. Two small foci of T2 signal abnormality in the upper cervical
spinal cord. These are nonspecific but suspicious for possible
demyelinating disease (multiple sclerosis or JHONNIFER) versus other
inflammatory process. MRI of the brain and thoracic spine without
and with IV contrast may be helpful to evaluate for other sites of
involvement.
2. No disc herniation or stenosis.

## 2018-05-02 ENCOUNTER — Ambulatory Visit: Payer: No Typology Code available for payment source | Admitting: Internal Medicine

## 2018-05-02 ENCOUNTER — Encounter

## 2018-08-20 ENCOUNTER — Emergency Department: Payer: Commercial Managed Care - PPO

## 2018-08-20 ENCOUNTER — Other Ambulatory Visit: Payer: Self-pay

## 2018-08-20 ENCOUNTER — Emergency Department
Admission: EM | Admit: 2018-08-20 | Discharge: 2018-08-20 | Disposition: A | Discharge status: Home / Self Care | Payer: Commercial Managed Care - PPO | Attending: Emergency Medicine | Admitting: Emergency Medicine

## 2018-08-20 DIAGNOSIS — G35 Multiple sclerosis: Secondary | ICD-10-CM | POA: Insufficient documentation

## 2018-08-20 DIAGNOSIS — E785 Hyperlipidemia, unspecified: Secondary | ICD-10-CM | POA: Diagnosis not present

## 2018-08-20 DIAGNOSIS — Z79899 Other long term (current) drug therapy: Secondary | ICD-10-CM | POA: Insufficient documentation

## 2018-08-20 DIAGNOSIS — R079 Chest pain, unspecified: Secondary | ICD-10-CM | POA: Diagnosis present

## 2018-08-20 DIAGNOSIS — K209 Esophagitis, unspecified without bleeding: Secondary | ICD-10-CM

## 2018-08-20 DIAGNOSIS — R0789 Other chest pain: Secondary | ICD-10-CM

## 2018-08-20 DIAGNOSIS — I10 Essential (primary) hypertension: Secondary | ICD-10-CM | POA: Insufficient documentation

## 2018-08-20 LAB — COMPREHENSIVE METABOLIC PANEL
ALBUMIN: 5 g/dL (ref 3.5–5.0)
ALT: 46 U/L — AB (ref 0–44)
AST: 34 U/L (ref 15–41)
Alkaline Phosphatase: 52 U/L (ref 38–126)
Anion gap: 12 (ref 5–15)
BUN: 12 mg/dL (ref 6–20)
CHLORIDE: 102 mmol/L (ref 98–111)
CO2: 22 mmol/L (ref 22–32)
CREATININE: 0.81 mg/dL (ref 0.44–1.00)
Calcium: 8.8 mg/dL — ABNORMAL LOW (ref 8.9–10.3)
GFR calc Af Amer: >60 mL/min (ref >60)
GFR calc non Af Amer: >60 mL/min (ref >60)
GLUCOSE: 95 mg/dL (ref 70–99)
POTASSIUM: 3.3 mmol/L — AB (ref 3.5–5.1)
Sodium: 136 mmol/L (ref 135–145)
Total Bilirubin: 2.2 mg/dL — ABNORMAL HIGH (ref 0.3–1.2)
Total Protein: 8.3 g/dL — ABNORMAL HIGH (ref 6.5–8.1)

## 2018-08-20 LAB — POCT PREGNANCY, URINE: Preg Test, Ur: NEGATIVE

## 2018-08-20 LAB — CBC WITH DIFFERENTIAL/PLATELET
ABS IMMATURE GRANULOCYTES: 0.01 10*3/uL (ref 0.00–0.07)
BASOS ABS: 0.1 10*3/uL (ref 0.0–0.1)
Basophils Relative: 1 %
Eosinophils Absolute: 0.1 10*3/uL (ref 0.0–0.5)
Eosinophils Relative: 2 %
HEMATOCRIT: 43.8 % (ref 36.0–46.0)
HEMOGLOBIN: 14.9 g/dL (ref 12.0–15.0)
Immature Granulocytes: 0 %
LYMPHS ABS: 1.7 10*3/uL (ref 0.7–4.0)
LYMPHS PCT: 25 %
MCH: 30 pg (ref 26.0–34.0)
MCHC: 34 g/dL (ref 30.0–36.0)
MCV: 88.1 fL (ref 80.0–100.0)
MONO ABS: 1 10*3/uL (ref 0.1–1.0)
Monocytes Relative: 15 %
NEUTROS ABS: 3.9 10*3/uL (ref 1.7–7.7)
Neutrophils Relative %: 57 %
Platelets: 314 10*3/uL (ref 150–400)
RBC: 4.97 MIL/uL (ref 3.87–5.11)
RDW: 12.8 % (ref 11.5–15.5)
WBC: 6.8 10*3/uL (ref 4.0–10.5)
nRBC: 0 % (ref 0.0–0.2)

## 2018-08-20 LAB — TROPONIN I: Troponin I: <0.03 ng/mL (ref <0.03)

## 2018-08-20 LAB — FIBRIN DERIVATIVES D-DIMER (ARMC ONLY): Fibrin derivatives D-dimer (ARMC): 184.43 ng/mL (FEU) (ref 0.00–499.00)

## 2018-08-20 LAB — MAGNESIUM: MAGNESIUM: 2 mg/dL (ref 1.7–2.4)

## 2018-08-20 LAB — GLUCOSE, CAPILLARY: Glucose-Capillary: 87 mg/dL (ref 70–99)

## 2018-08-20 MED ORDER — MORPHINE SULFATE (PF) 4 MG/ML IV SOLN
4.0000 mg | Freq: Once | INTRAVENOUS | Status: AC
  Administered 2018-08-20: 4 mg via INTRAVENOUS
  Filled 2018-08-20: qty 1

## 2018-08-20 MED ORDER — LORAZEPAM 2 MG/ML IJ SOLN
1.0000 mg | Freq: Once | INTRAMUSCULAR | Status: AC
  Administered 2018-08-20: 1 mg via INTRAVENOUS
  Filled 2018-08-20: qty 1

## 2018-08-20 MED ORDER — SODIUM CHLORIDE 0.9 % IV BOLUS
1000.0000 mL | Freq: Once | INTRAVENOUS | Status: AC
  Administered 2018-08-20: 1000 mL via INTRAVENOUS

## 2018-08-20 MED ORDER — ONDANSETRON HCL 4 MG/2ML IJ SOLN
4.0000 mg | INTRAMUSCULAR | Status: AC
  Administered 2018-08-20: 4 mg via INTRAVENOUS
  Filled 2018-08-20: qty 2

## 2018-08-20 MED ORDER — IOHEXOL 350 MG/ML SOLN
100.0000 mL | Freq: Once | INTRAVENOUS | Status: AC | PRN
  Administered 2018-08-20: 75 mL via INTRAVENOUS

## 2018-08-20 MED ORDER — PANTOPRAZOLE SODIUM 40 MG PO TBEC: 40.0000 mg | DELAYED_RELEASE_TABLET | Freq: Every day | ORAL | 1 refills | Status: DC

## 2018-08-20 NOTE — ED Notes (Signed)
Pt given saltine crackers, peanut butter, water, ginger ale per MD request

## 2018-08-20 NOTE — ED Provider Notes (Signed)
Patient is in no acute distress, feeling better.  CT did reveal some esophagitis and potentially distended gallbladder.  She knows Dr. Vira Agar, I will refer her to him for outpatient follow-up.  She will be placed on Protonix.   Earleen Newport, MD 08/20/18 [phone]

## 2018-08-20 NOTE — ED Triage Notes (Signed)
Pt to the er for chest pain all day but has gotten worse over the last 2 hours. Pt got nervous and called EMS. Pt is on day 5 of fasting with only chicken broth and water. Pain radiates into the neck and jaw. Pt took 162mg  of ASA at home and pt received the other 162mg  of ASA on truck.

## 2018-08-20 NOTE — ED Provider Notes (Signed)
[facility] Emergency Department Provider Note  ____________________________________________   First MD Initiated Contact with Patient 08/20/18 [phone]     (approximate)  I have reviewed the triage vital signs and the nursing notes.   HISTORY  Chief Complaint Chest Pain    HPI Shannon Davidson is a 38 y.o. female with medical history as listed below which notably includes a prior diagnosis of relapsing remitting multiple sclerosis, but the patient says that she was told she does not in fact have MS and that all of the symptoms she was having were due to multiple tickborne illnesses and all of her symptoms went away after she was treated.  She presents tonight by EMS for evaluation of gradually worsening chest pain over the course of the day.  She is in some degree of distress currently and reports that the pain feels very heavy and aching and is primarily in the top part of both sides of her chest and radiating up into her neck.  She first noticed the symptoms this morning but they were mild and they have steadily gotten worse over the course of the day.  She has some associated shortness of breath.  She denies fever/chills, viral symptoms, nausea, vomiting, abdominal pain.  She has never had symptoms like this before.  She has a history of high cholesterol but denies history of hypertension, diabetes, and tobacco use.  She has no first-degree relatives who have had heart attacks.  She does not have a history of blood clots in the legs nor the lungs.  She has not had any recent surgeries or immobilizations, no long trips, no unilateral leg pain or swelling, no estrogen use or other hormone use.  Of note, she is 5 days into a fast where she is only been drinking water and drinking some chicken broth.  She says she was doing this "as a spiritual thing".  Past Medical History:  Diagnosis Date  . HLD (hyperlipidemia)   . Kidney stone    Dr. Bernardo Heater  . Palpitations   .  Pap smear for cervical cancer screening 2012   Dr. Benjie Karvonen  . PCOS (polycystic ovarian syndrome)   . Relapsing remitting multiple sclerosis ([place]) 11/25/2014   Patient says that all her MS symptoms went away in 2017-2018 after she was treated for tick-bourne illnesses    Patient Active Problem List   Diagnosis Date Noted  . Chronic fatigue 06/18/2015  . HSV infection 01/20/2015  . Chronic maxillary sinusitis 01/20/2015  . Relapsing remitting multiple sclerosis ([place]) 11/25/2014  . Routine general medical examination at a health care facility 06/17/2014  . Low back pain 06/17/2014  . Elevated LFTs 02/20/2014  . Arthralgia 02/20/2014  . Adjustment disorder with mixed anxiety and depressed mood 05/25/2013  . PCOS (polycystic ovarian syndrome) 09/09/2011  . Overweight (BMI 25.0-29.9) 06/08/2011  . HYPERLIPIDEMIA-MIXED 08/19/2009  . HEADACHE 08/19/2009  . PALPITATIONS 08/19/2009  . MURMUR 08/19/2009    Past Surgical History:  Procedure Laterality Date  . CESAREAN SECTION  2008  . TONSILLECTOMY N/A 05/20/2016   Procedure: TONSILLECTOMY;  Surgeon: Margaretha Sheffield, MD;  Location: [facility];  Service: ENT;  Laterality: N/A;    Prior to Admission medications   Medication Sig Start Date End Date Taking? Authorizing Provider  ALPRAZolam (XANAX) 0.25 MG tablet Take 1 tablet (0.25 mg total) by mouth at bedtime as needed for anxiety. Patient not taking: Reported on 05/11/2016 01/20/15   Jackolyn Confer, MD  amphetamine-dextroamphetamine (ADDERALL) 15  MG tablet Take by mouth 2 (two) times daily as needed (for fatigue).     [provider]  carbamazepine (TEGRETOL XR) 100 MG 12 hr tablet Take 100 mg by mouth 2 (two) times daily as needed (for TMJ).    [provider]  Cholecalciferol (VITAMIN D3) 5000 UNITS CAPS Take 10,000 Units by mouth daily.     [provider]  Coenzyme Q10-Fish Oil-Vit E (CO-Q 10 OMEGA-3 FISH OIL PO) Take 1 tablet by mouth.    [provider]  cyclobenzaprine (FLEXERIL) 10 MG tablet Take 10 mg by mouth at bedtime as needed for muscle spasms.    Jackolyn Confer, MD  lidocaine (XYLOCAINE) 2 % solution Use as directed 20 mLs in the mouth or throat every 2 (two) hours as needed for mouth pain. Gargle and spit out 05/24/16   Carrie Mew, MD  Melatonin 10 MG TABS Take 10 mg by mouth at bedtime.    [provider]  Multiple Vitamins-Minerals (MULTIVITAMIN WOMEN PO) Take 1 tablet by mouth.    [provider]  natalizumab (TYSABRI) 300 MG/15ML injection Inject 300 mg into the vein every 28 (twenty-eight) days.    [provider]  ondansetron (ZOFRAN) 8 MG tablet Take 1 tablet (8 mg total) by mouth every 8 (eight) hours as needed for nausea or vomiting. Patient not taking: Reported on 05/11/2016 02/20/14   Jackolyn Confer, MD  Probiotic Product (PROBIOTIC-10 PO) Take 1 tablet by mouth.    [provider]  valACYclovir (VALTREX) 500 MG tablet Take 1 tablet (500 mg total) by mouth 2 (two) times daily. Patient not taking: Reported on 05/11/2016 05/25/13   Jackolyn Confer, MD    Allergies Patient has no known allergies.  Family History  Problem Relation Age of Onset  . Heart disease Maternal Grandmother 60       s/p CABG  . Dementia Maternal Grandmother   . Hypertension Mother   . Seizures Father   . Hyperlipidemia Father   . Achalasia Sister   . COPD Maternal Grandfather   . Diabetes Paternal Grandmother     Social History Social History   Tobacco Use  . Smoking status: Never Smoker  . Smokeless tobacco: Never Used  . Tobacco comment: tobacco use - no  Substance Use Topics  . Alcohol use: Yes    Comment: social - maybe 3 glasses of wine/month   . Drug use: No    Review of Systems Constitutional: No fever/chills Eyes: No visual changes. ENT: No sore throat. Cardiovascular:  Respiratory: Denies shortness of breath. Gastrointestinal: No abdominal pain.  No  nausea, no vomiting.  No diarrhea.  No constipation. Genitourinary: Negative for dysuria. Musculoskeletal: Negative for neck pain.  Negative for back pain. Integumentary: Negative for rash. Neurological: Negative for headaches, focal weakness or numbness.   ____________________________________________   PHYSICAL EXAM:  VITAL SIGNS: ED Triage Vitals  Enc Vitals Group     BP 08/20/18 0300 (!) 156/96     Pulse Rate 08/20/18 0300 (!) 104     Resp 08/20/18 0313 18     Temp 08/20/18 0313 98.1 F (36.7 C)     Temp Source 08/20/18 0313 Oral     SpO2 08/20/18 0300 98 %     Weight 08/20/18 0314 86.2 kg (190 lb)     Height 08/20/18 0314 1.651 m (5\' 5" )     Head Circumference --      Peak Flow --  Pain Score 08/20/18 0310 5     Pain Loc --      Pain Edu? --      Excl. in [place]? --     Constitutional: Alert and oriented.  Appears to be in pain and at least mild distress, but is awake, alert, and able to answer all of my questions. Eyes: Conjunctivae are normal.  Head: Atraumatic. Nose: No congestion/rhinnorhea. Mouth/Throat: Mucous membranes are moist. Neck: No stridor.  No meningeal signs.   Cardiovascular: Borderline tachycardia with heart rate between 90 and 100, regular rhythm. Good peripheral circulation. Grossly normal heart sounds. Respiratory: Normal respiratory effort.  No retractions. Lungs CTAB. Gastrointestinal: Soft and nontender. No distention.  Musculoskeletal: No lower extremity tenderness nor edema. No gross deformities of extremities. Neurologic:  Normal speech and language. No gross focal neurologic deficits are appreciated.  Skin:  Skin is warm, dry and intact. No rash noted. Psychiatric: Mood and affect are a little bit anxious but generally appropriate under the circumstances.  ____________________________________________   LABS (all labs ordered are listed, but only abnormal results are displayed)  Labs Reviewed  COMPREHENSIVE METABOLIC PANEL - Abnormal;  Notable for the following components:      Result Value   Potassium 3.3 (*)    Calcium 8.8 (*)    Total Protein 8.3 (*)    ALT 46 (*)    Total Bilirubin 2.2 (*)    All other components within normal limits  GLUCOSE, CAPILLARY  CBC WITH DIFFERENTIAL/PLATELET  TROPONIN I  MAGNESIUM  FIBRIN DERIVATIVES D-DIMER ([facility] ONLY)  TROPONIN I  POCT PREGNANCY, URINE  POC URINE PREG, ED   ____________________________________________  EKG  ED ECG REPORT I, Hinda Kehr, the attending physician, personally viewed and interpreted this ECG.  Date: 08/20/2018 EKG Time: 3:05 AM Rate: 96 Rhythm: normal sinus rhythm QRS Axis: normal Intervals: normal ST/T Wave abnormalities: The patient has T wave inversions in lead III and aVF although the T waves have more of a notched appearance in 2 and aVF.  T waves are also inverted in V3 and V4.  Questionable ischemia but does not meet STEMI criteria.  I do not appreciate any ST segment depression or elevation. Narrative Interpretation: Possible ischemia in the inferior leads, but no definitive ischemia and does not meet STEMI criteria.   ____________________________________________  RADIOLOGY I, Hinda Kehr, personally viewed and evaluated these images (plain radiographs) as part of my medical decision making, as well as reviewing the written report by the radiologist.  ED MD interpretation:  CXR normal.  CTA chest/abd/pelvis pending at sign out.  Official radiology report(s): Dg Chest Portable 1 View  Result Date: 08/20/2018 CLINICAL DATA:  Chest pain for 2 hours. EXAM: PORTABLE CHEST 1 VIEW COMPARISON:  None available. FINDINGS: The cardiac silhouette, mediastinal and hilar contours are normal. The lungs are clear. No pleural effusion. The bony thorax is intact. IMPRESSION: No acute cardiopulmonary findings. Electronically Signed   By: Marijo Sanes M.D.   On: 08/20/2018 04:21     ____________________________________________   PROCEDURES  Critical Care performed: No   Procedure(s) performed:   Procedures   ____________________________________________   INITIAL IMPRESSION / ASSESSMENT AND PLAN / ED COURSE  As part of my medical decision making, I reviewed the following data within the [facility] notes reviewed and incorporated, Labs reviewed , EKG interpreted , Old chart reviewed, Patient signed out to Dr. Jimmye Norman, Radiograph reviewed  and Notes from prior ED visits    Differential diagnosis includes,  but is not limited to, ACS, PE, electrolyte abnormality, acute infection such as pneumonia, pneumothorax, multiple sclerosis flare, anxiety/panic attack.  The patient denies a history of anxiety and panic attack and the symptoms were gradual in onset over the course of the day.  She is in moderate distress right now and I am giving morphine 4 mg IV and Zofran 4 mg IV as well as a liter of fluids.  I am not certain how her 5 days of fasting could be contributing to her symptoms and less there were some acute abnormalities of her electrolytes.  She is low risk for ACS based on HEART score and she could potentially be PERC negative but occasionally her heart rate is over 100 and I do not have a better explanation for her symptoms, and a pulmonary embolism could explain everything she is feeling right now.  She remains low risk for PE and I think it is appropriate to try to rule her out with a d-dimer.  EKG is questionable and could be normal variation or could represent some mild ischemia in the inferior leads.  I am checking lab work appropriate under the circumstances including a metabolic panel, CBC, d-dimer, and troponin and I will reassess after treatment and work-up.  Also checking a chest x-ray to rule out acute issues such as pneumothorax and pneumonia although she has clear lung sounds throughout all lung fields.  Clinical Course as  of Aug 21 715  Sun Aug 20, 2018  0540 The patient's work-up is reassuring.  Her comprehensive metabolic panel shows no acute abnormalities other than a slightly elevated total bilirubin which is of unclear significance in the absence of any abdominal pain or vomiting (possibly Gilbert's syndrome).  Her d-dimer is well within normal limits, troponin negative, electrolytes all normal, CBC normal.  Her chest x-ray shows no acute abnormalities.  I will check a 3-hour troponin (ordered for about 6 AM).  I reassessed the patient and she feels much better after the morphine but still feels some nonspecific discomfort in her chest.  The only other emergent diagnosis that could be clinically relevant at this point would be aortic dissection, but this would be an extremely atypical presentation.  She is reporting that the sensation is pressure-like, not radiating to her back, she has no neurological symptoms, her blood pressure, hypertensive, is reasonable, and her symptoms improved significantly with morphine, and she has a reassuring EKG with no sign of a widened mediastinum.  In her particular case, being a 38 year old woman with no risk factors, I do not feel that a CTA chest/abdomen/pelvis would be necessary or appropriate to rule out aortic dissection.  She is comfortable with the plan for the follow-up troponin and reassessment.   [CF]  [phone] The patient is reporting that her pain and pressure sensation is coming back and is now an 8 out of 10 again.  Her blood pressure is gone up little bit and is now 169/107.  It is very difficult to understand whether the blood pressure is causing her symptoms or vice versa.  Given that her symptoms are getting worse and her blood pressure is going up again, I am going to obtain a CTA chest/abdomen/pelvis to rule out aortic dissection.  Patient agrees with the plan.   [CF]  [id number] I am also ordering Ativan 1 mg IV to see if this helps with her symptoms   [CF]  0717 Discussed  case with Dr. Jimmye Norman who is assuming care.   [CF]  Clinical Course User Index [CF] Hinda Kehr, MD    ____________________________________________  FINAL CLINICAL IMPRESSION(S) / ED DIAGNOSES  Final diagnoses:  Atypical chest pain  Essential hypertension     MEDICATIONS GIVEN DURING THIS VISIT:  Medications  sodium chloride 0.9 % bolus 1,000 mL (0 mLs Intravenous Stopped 08/20/18 0442)  morphine 4 MG/ML injection 4 mg (4 mg Intravenous Given 08/20/18 0410)  ondansetron (ZOFRAN) injection 4 mg (4 mg Intravenous Given 08/20/18 0410)  LORazepam (ATIVAN) injection 1 mg (1 mg Intravenous Given 08/20/18 0635)  iohexol (OMNIPAQUE) 350 MG/ML injection 100 mL (75 mLs Intravenous Contrast Given 08/20/18 0644)     ED Discharge Orders    None       Note:  This document was prepared using Dragon voice recognition software and may include unintentional dictation errors.   Hinda Kehr, MD 08/20/18 [phone]

## 2018-08-20 NOTE — Discharge Instructions (Addendum)

## 2018-08-20 NOTE — ED Notes (Addendum)
Pt states she has developed deep cough since coming to ed and feels like it is harder to breathe. Pt asking if it would be possible to have a breathing treatment just to see if it will help. RN listened to lungs sounds at this time, breath sounds clear with equal expansion, no wheezing or other complications noted at this time.

## 2019-02-20 DIAGNOSIS — G4719 Other hypersomnia: Secondary | ICD-10-CM | POA: Insufficient documentation

## 2019-04-25 ENCOUNTER — Other Ambulatory Visit: Payer: Self-pay

## 2019-04-25 DIAGNOSIS — Z20822 Contact with and (suspected) exposure to covid-19: Secondary | ICD-10-CM

## 2019-04-27 LAB — NOVEL CORONAVIRUS, NAA: SARS-CoV-2, NAA: NOT DETECTED

## 2019-05-02 ENCOUNTER — Other Ambulatory Visit: Payer: Self-pay | Admitting: *Deleted

## 2019-05-02 DIAGNOSIS — Z20822 Contact with and (suspected) exposure to covid-19: Secondary | ICD-10-CM

## 2019-05-04 LAB — NOVEL CORONAVIRUS, NAA: SARS-CoV-2, NAA: NOT DETECTED

## 2019-09-10 HISTORY — PX: APPENDECTOMY: SHX54

## 2019-09-18 DIAGNOSIS — K3580 Unspecified acute appendicitis: Secondary | ICD-10-CM | POA: Insufficient documentation

## 2020-05-22 DIAGNOSIS — Z8719 Personal history of other diseases of the digestive system: Secondary | ICD-10-CM | POA: Diagnosis not present

## 2020-05-22 DIAGNOSIS — E559 Vitamin D deficiency, unspecified: Secondary | ICD-10-CM | POA: Diagnosis not present

## 2020-05-22 DIAGNOSIS — G35 Multiple sclerosis: Secondary | ICD-10-CM | POA: Diagnosis not present

## 2020-05-22 DIAGNOSIS — K219 Gastro-esophageal reflux disease without esophagitis: Secondary | ICD-10-CM | POA: Diagnosis not present

## 2020-05-28 ENCOUNTER — Other Ambulatory Visit: Payer: Self-pay | Admitting: Psychiatry

## 2020-05-28 DIAGNOSIS — G35 Multiple sclerosis: Secondary | ICD-10-CM

## 2020-06-18 ENCOUNTER — Other Ambulatory Visit: Payer: Self-pay

## 2020-06-18 ENCOUNTER — Ambulatory Visit
Admission: RE | Admit: 2020-06-18 | Discharge: 2020-06-18 | Disposition: A | Discharge status: Home / Self Care | Payer: 59 | Source: Ambulatory Visit | Attending: Psychiatry | Admitting: Psychiatry

## 2020-06-18 DIAGNOSIS — G35 Multiple sclerosis: Secondary | ICD-10-CM

## 2020-06-18 MED ORDER — GADOBUTROL 1 MMOL/ML IV SOLN
9.0000 mL | Freq: Once | INTRAVENOUS | Status: AC | PRN
  Administered 2020-06-18: 9 mL via INTRAVENOUS

## 2020-08-25 DIAGNOSIS — H9122 Sudden idiopathic hearing loss, left ear: Secondary | ICD-10-CM | POA: Diagnosis not present

## 2020-08-25 DIAGNOSIS — H9319 Tinnitus, unspecified ear: Secondary | ICD-10-CM | POA: Diagnosis not present

## 2020-08-25 DIAGNOSIS — H9312 Tinnitus, left ear: Secondary | ICD-10-CM | POA: Diagnosis not present

## 2020-09-22 DIAGNOSIS — R300 Dysuria: Secondary | ICD-10-CM | POA: Diagnosis not present

## 2020-09-22 DIAGNOSIS — A499 Bacterial infection, unspecified: Secondary | ICD-10-CM | POA: Diagnosis not present

## 2020-09-22 DIAGNOSIS — N390 Urinary tract infection, site not specified: Secondary | ICD-10-CM | POA: Diagnosis not present

## 2020-09-28 ENCOUNTER — Ambulatory Visit
Admission: EM | Admit: 2020-09-28 | Discharge: 2020-09-28 | Disposition: A | Discharge status: Home / Self Care | Payer: 59 | Attending: Physician Assistant | Admitting: Physician Assistant

## 2020-09-28 ENCOUNTER — Other Ambulatory Visit: Payer: Self-pay

## 2020-09-28 DIAGNOSIS — N3000 Acute cystitis without hematuria: Secondary | ICD-10-CM | POA: Diagnosis not present

## 2020-09-28 DIAGNOSIS — R300 Dysuria: Secondary | ICD-10-CM

## 2020-09-28 HISTORY — DX: Lyme disease, unspecified: A69.20

## 2020-09-28 LAB — URINALYSIS, COMPLETE (UACMP) WITH MICROSCOPIC: Bacteria, UA: NONE SEEN

## 2020-09-28 MED ORDER — FLUCONAZOLE 150 MG PO TABS: ORAL_TABLET | ORAL | 0 refills | Status: DC

## 2020-09-28 MED ORDER — CIPROFLOXACIN HCL 250 MG PO TABS: 250.0000 mg | ORAL_TABLET | Freq: Two times a day (BID) | ORAL | 0 refills | Status: AC

## 2020-09-28 NOTE — ED Triage Notes (Signed)
Pt treated for UTI 8 days ago with Cefdinir but still having dysuria

## 2020-09-28 NOTE — Discharge Instructions (Addendum)
We are unable to run the urinalysis but I can culture the urine.  I am going to prescribe an antibiotic based on your previous culture on 09/22/2020.  Cipro seems to work.  I would normally prescribe a 5-day course, but you have asked for a 10-day course. Take Diflucan as needed for yeast infection.

## 2020-09-28 NOTE — ED Provider Notes (Signed)
[facility]    CSN: [id number] Arrival date & time: 09/28/20  0803      History   Chief Complaint Chief Complaint  Patient presents with  . Dysuria    HPI Shannon Davidson is a 40 y.o. female  presenting for dysuria, urinary frequency and urgency starting yesterday.  Patient states that on 09/22/2020 she was treated for a UTI with cefdinir.  She is on her seventh day of this medication.  Patient states that her symptoms had resolved and then returned yesterday and today.  She has been taking Pyridium which does seem to help a little.  She denies any fever, fatigue, chills, back pain, hematuria, vaginal discharge/ itching or odor.  Patient states that her urine culture was sensitive to everything except cefuroxime.  She is concerned that she may need a different antibiotic and requests a longer course.  Patient states she is taken Cipro before and requested 10-day course this medication.  She also requests Diflucan since she is prone to yeast infections with taking a lot of antibiotics.  She has no other concerns.  HPI  Past Medical History:  Diagnosis Date  . HLD (hyperlipidemia)   . Kidney stone    Dr. Bernardo Heater  . Lyme disease   . Palpitations   . Pap smear for cervical cancer screening 2012   Dr. Benjie Karvonen  . PCOS (polycystic ovarian syndrome)   . Relapsing remitting multiple sclerosis ([place]) 11/25/2014   Patient says that all her MS symptoms went away in 2017-2018 after she was treated for tick-bourne illnesses    Patient Active Problem List   Diagnosis Date Noted  . Chronic fatigue 06/18/2015  . HSV infection 01/20/2015  . Chronic maxillary sinusitis 01/20/2015  . Relapsing remitting multiple sclerosis ([place]) 11/25/2014  . Routine general medical examination at a health care facility 06/17/2014  . Low back pain 06/17/2014  . Elevated LFTs 02/20/2014  . Arthralgia 02/20/2014  . Adjustment disorder with mixed anxiety and depressed mood 05/25/2013  . PCOS (polycystic  ovarian syndrome) 09/09/2011  . Overweight (BMI 25.0-29.9) 06/08/2011  . HYPERLIPIDEMIA-MIXED 08/19/2009  . HEADACHE 08/19/2009  . PALPITATIONS 08/19/2009  . MURMUR 08/19/2009    Past Surgical History:  Procedure Laterality Date  . CESAREAN SECTION  2008  . TONSILLECTOMY N/A 05/20/2016   Procedure: TONSILLECTOMY;  Surgeon: Margaretha Sheffield, MD;  Location: [facility];  Service: ENT;  Laterality: N/A;    OB History   No obstetric history on file.      Home Medications    Prior to Admission medications   Medication Sig Start Date End Date Taking? Authorizing Provider  ciprofloxacin (CIPRO) 250 MG tablet Take 1 tablet (250 mg total) by mouth every 12 (twelve) hours for 10 days. 09/28/20 10/08/20 Yes Danton Clap, PA-C  fluconazole (DIFLUCAN) 150 MG tablet Take 1 tablet by mouth every 72 hours as needed for yeast infection 09/28/20  Yes Danton Clap, PA-C  ALPRAZolam Duanne Moron) 0.25 MG tablet Take 1 tablet (0.25 mg total) by mouth at bedtime as needed for anxiety. Patient not taking: No sig reported 01/20/15   Jackolyn Confer, MD  amphetamine-dextroamphetamine (ADDERALL) 15 MG tablet Take by mouth 2 (two) times daily as needed (for fatigue).     [provider]  carbamazepine (TEGRETOL XR) 100 MG 12 hr tablet Take 100 mg by mouth 2 (two) times daily as needed (for TMJ).    [provider]  Cholecalciferol (VITAMIN D3) 5000 UNITS CAPS Take 10,000 Units  by mouth daily.     [provider]  Coenzyme Q10-Fish Oil-Vit E (CO-Q 10 OMEGA-3 FISH OIL PO) Take 1 tablet by mouth.    [provider]  cyclobenzaprine (FLEXERIL) 10 MG tablet Take 10 mg by mouth at bedtime as needed for muscle spasms.    Jackolyn Confer, MD  lidocaine (XYLOCAINE) 2 % solution Use as directed 20 mLs in the mouth or throat every 2 (two) hours as needed for mouth pain. Gargle and spit out 05/24/16   Carrie Mew, MD  Melatonin 10 MG TABS Take 10 mg by mouth at bedtime.     [provider]  Multiple Vitamins-Minerals (MULTIVITAMIN WOMEN PO) Take 1 tablet by mouth.    [provider]  natalizumab (TYSABRI) 300 MG/15ML injection Inject 300 mg into the vein every 28 (twenty-eight) days.    [provider]  ondansetron (ZOFRAN) 8 MG tablet Take 1 tablet (8 mg total) by mouth every 8 (eight) hours as needed for nausea or vomiting. Patient not taking: No sig reported 02/20/14   Jackolyn Confer, MD  pantoprazole (PROTONIX) 40 MG tablet Take 1 tablet (40 mg total) by mouth daily. 08/20/18 08/20/19  Earleen Newport, MD  Probiotic Product (PROBIOTIC-10 PO) Take 1 tablet by mouth.    [provider]  valACYclovir (VALTREX) 500 MG tablet Take 1 tablet (500 mg total) by mouth 2 (two) times daily. Patient not taking: No sig reported 05/25/13   Jackolyn Confer, MD    Family History Family History  Problem Relation Age of Onset  . Heart disease Maternal Grandmother 60       s/p CABG  . Dementia Maternal Grandmother   . Hypertension Mother   . Seizures Father   . Hyperlipidemia Father   . Achalasia Sister   . COPD Maternal Grandfather   . Diabetes Paternal Grandmother     Social History Social History   Tobacco Use  . Smoking status: Never Smoker  . Smokeless tobacco: Never Used  . Tobacco comment: tobacco use - no  Vaping Use  . Vaping Use: Never used  Substance Use Topics  . Alcohol use: Yes    Comment: social - maybe 3 glasses of wine/month   . Drug use: No     Allergies   Patient has no known allergies.   Review of Systems Review of Systems  Constitutional: Negative for chills and fever.  Gastrointestinal: Positive for abdominal pain (suprapubic discomfort). Negative for diarrhea, nausea and vomiting.  Genitourinary: Positive for dysuria, frequency and urgency. Negative for decreased urine volume, flank pain, hematuria, pelvic pain, vaginal bleeding, vaginal discharge and vaginal pain.  Musculoskeletal:  Negative for back pain.  Skin: Negative for rash.     Physical Exam Triage Vital Signs ED Triage Vitals  Enc Vitals Group     BP 09/28/20 0816 131/76     Pulse Rate 09/28/20 0816 79     Resp 09/28/20 0816 17     Temp 09/28/20 0816 98.5 F (36.9 C)     Temp Source 09/28/20 0816 Oral     SpO2 09/28/20 0816 98 %     Weight 09/28/20 0815 190 lb (86.2 kg)     Height 09/28/20 0815 5\' 5"  (1.651 m)     Head Circumference --      Peak Flow --      Pain Score 09/28/20 0815 2     Pain Loc --      Pain Edu? --  Excl. in GC? --    No data found.  Updated Vital Signs BP 131/76   Pulse 79   Temp 98.5 F (36.9 C) (Oral)   Resp 17   Ht 5\' 5"  (1.651 m)   Wt 190 lb (86.2 kg)   LMP 09/18/2020   SpO2 98%   BMI 31.62 kg/m       Physical Exam Vitals and nursing note reviewed.  Constitutional:      General: She is not in acute distress.    Appearance: Normal appearance. She is not ill-appearing or toxic-appearing.  HENT:     Head: Normocephalic and atraumatic.  Eyes:     General: No scleral icterus.       Right eye: No discharge.        Left eye: No discharge.     Conjunctiva/sclera: Conjunctivae normal.  Cardiovascular:     Rate and Rhythm: Normal rate and regular rhythm.     Heart sounds: Normal heart sounds.  Pulmonary:     Effort: Pulmonary effort is normal. No respiratory distress.     Breath sounds: Normal breath sounds.  Abdominal:     Palpations: Abdomen is soft.     Tenderness: There is abdominal tenderness (mild TTP suprapubic region). There is no right CVA tenderness or left CVA tenderness.  Musculoskeletal:     Cervical back: Neck supple.  Skin:    General: Skin is dry.  Neurological:     General: No focal deficit present.     Mental Status: She is alert. Mental status is at baseline.     Motor: No weakness.     Gait: Gait normal.  Psychiatric:        Mood and Affect: Mood normal.        Behavior: Behavior normal.        Thought Content: Thought  content normal.      UC Treatments / Results  Labs (all labs ordered are listed, but only abnormal results are displayed) Labs Reviewed  URINALYSIS, COMPLETE (UACMP) WITH MICROSCOPIC - Abnormal; Notable for the following components:      Result Value   Color, Urine ORANGE (*)    Glucose, UA   (*)    Value: TEST NOT REPORTED DUE TO COLOR INTERFERENCE OF URINE PIGMENT   Hgb urine dipstick   (*)    Value: TEST NOT REPORTED DUE TO COLOR INTERFERENCE OF URINE PIGMENT   Bilirubin Urine   (*)    Value: TEST NOT REPORTED DUE TO COLOR INTERFERENCE OF URINE PIGMENT   Ketones, ur   (*)    Value: TEST NOT REPORTED DUE TO COLOR INTERFERENCE OF URINE PIGMENT   Protein, ur   (*)    Value: TEST NOT REPORTED DUE TO COLOR INTERFERENCE OF URINE PIGMENT   Nitrite   (*)    Value: TEST NOT REPORTED DUE TO COLOR INTERFERENCE OF URINE PIGMENT   Leukocytes,Ua   (*)    Value: TEST NOT REPORTED DUE TO COLOR INTERFERENCE OF URINE PIGMENT   All other components within normal limits  URINE CULTURE    EKG   Radiology No results found.  Procedures Procedures (including critical care time)  Medications Ordered in UC Medications - No data to display  Initial Impression / Assessment and Plan / UC Course  I have reviewed the triage vital signs and the nursing notes.  Pertinent labs & imaging results that were available during my care of the patient were reviewed by me and considered in  my medical decision making (see chart for details).   40 year old female presenting for return of dysuria, urinary frequency and urgency history.  I was able to view her urine culture from 09/22/2020 and confirmed it was sensitive to everything except cefuroxime.  Unable to have an accurate urinalysis since she is taking Pyridium.  We will culture urine again and treat for UTI based on her urine culture.  Sent Cipro.  Advised her that she probably only needs 5 days this medication.  She has requested a full 10 days.  I also  sent Diflucan.  Advised to increase rest and fluids.  ED precautions reviewed.  Final Clinical Impressions(s) / UC Diagnoses   Final diagnoses:  Acute cystitis without hematuria  Dysuria     Discharge Instructions     We are unable to run the urinalysis but I can culture the urine.  I am going to prescribe an antibiotic based on your previous culture on 09/22/2020.  Cipro seems to work.  I would normally prescribe a 5-day course, but you have asked for a 10-day course. Take Diflucan as needed for yeast infection.    ED Prescriptions    Medication Sig Dispense Auth. Provider   ciprofloxacin (CIPRO) 250 MG tablet Take 1 tablet (250 mg total) by mouth every 12 (twelve) hours for 10 days. 20 tablet Laurene Footman B, PA-C   fluconazole (DIFLUCAN) 150 MG tablet Take 1 tablet by mouth every 72 hours as needed for yeast infection 2 tablet Danton Clap, PA-C     PDMP not reviewed this encounter.   Danton Clap, PA-C 09/28/20 [phone]

## 2020-09-29 LAB — URINE CULTURE: Culture: NO GROWTH

## 2020-10-17 ENCOUNTER — Ambulatory Visit
Admission: EM | Admit: 2020-10-17 | Discharge: 2020-10-17 | Disposition: A | Discharge status: Home / Self Care | Payer: 59 | Attending: Emergency Medicine | Admitting: Emergency Medicine

## 2020-10-17 ENCOUNTER — Other Ambulatory Visit: Payer: Self-pay

## 2020-10-17 ENCOUNTER — Encounter: Payer: Self-pay | Admitting: Emergency Medicine

## 2020-10-17 DIAGNOSIS — B9689 Other specified bacterial agents as the cause of diseases classified elsewhere: Secondary | ICD-10-CM | POA: Insufficient documentation

## 2020-10-17 DIAGNOSIS — N760 Acute vaginitis: Secondary | ICD-10-CM | POA: Diagnosis not present

## 2020-10-17 LAB — CHLAMYDIA/NGC RT PCR (ARMC ONLY)
Chlamydia Tr: NOT DETECTED
N gonorrhoeae: NOT DETECTED

## 2020-10-17 LAB — URINALYSIS, COMPLETE (UACMP) WITH MICROSCOPIC
Bilirubin Urine: NEGATIVE
Glucose, UA: NEGATIVE mg/dL
Hgb urine dipstick: NEGATIVE
Ketones, ur: NEGATIVE mg/dL
Leukocytes,Ua: NEGATIVE
Nitrite: NEGATIVE
Protein, ur: NEGATIVE mg/dL
Specific Gravity, Urine: 1.025 (ref 1.005–1.030)
pH: 5.5 (ref 5.0–8.0)

## 2020-10-17 LAB — WET PREP, GENITAL
Sperm: NONE SEEN
Trich, Wet Prep: NONE SEEN
Yeast Wet Prep HPF POC: NONE SEEN

## 2020-10-17 LAB — HIV ANTIBODY (ROUTINE TESTING W REFLEX): HIV Screen 4th Generation wRfx: NONREACTIVE

## 2020-10-17 MED ORDER — METRONIDAZOLE 500 MG PO TABS: 500.0000 mg | ORAL_TABLET | Freq: Two times a day (BID) | ORAL | 0 refills | Status: AC

## 2020-10-17 MED ORDER — PHENAZOPYRIDINE HCL 200 MG PO TABS: 200.0000 mg | ORAL_TABLET | Freq: Three times a day (TID) | ORAL | 0 refills | Status: DC | PRN

## 2020-10-17 NOTE — ED Provider Notes (Signed)
HPI  SUBJECTIVE:  Shannon Davidson is a 40 y.o. female who presents with a "UTI." She reports dysuria, urgency, frequency.  States that she is urinating small amounts at a time.  Symptoms started yesterday.  She restarted herself on Cipro  250 mg twice daily yesterday.  She is on day #2 of this.  No cloudy odorous urine, hematuria.  No vaginal odor, bleeding, discharge, vaginal itching.  She is sexually active with a new female partner who is asymptomatic to her knowledge.  She does not have any other partners.  She is not sure if he has any other partners.  She reports low midline back pain described as soreness today as well-attributes this to musculoskeletal pain.  No vomiting, fevers, abdominal, pelvic pain.  No antipyretic in the past 6 hours.  She has tried Azo, Cipro, cranberry juice and increasing fluids without improvement in her symptoms.  Symptoms are worse with urination.  She is a past medical history of EBV, MS, UTIs, obstructing nephrolithiasis.  She states that her pain is not like that pain.  She has a history of chlamydia and vaginal yeast infections.  No history of pyelonephritis, gonorrhea, HIV, HSV, syphilis, trichomonas, BV.  LMP: 3/10.  Denies possibility of being pregnant.  States that she has not had intercourse since 3/10.  PMD: None.  She was treated for UTI on 3/14 with cefdinir for 7 days.  She came here on 3/20 for UTI symptoms.  Her urine culture was sensitive to everything, including ceftriaxone, except cefuroxime.  She was started on Cipro for 5 days and was also prescribed Diflucan.  Urine culture on 3/20 was negative for UTI.     Past Medical History:  Diagnosis Date  . HLD (hyperlipidemia)   . Kidney stone    Dr. Bernardo Heater  . Lyme disease   . Palpitations   . Pap smear for cervical cancer screening 2012   Dr. Benjie Karvonen  . PCOS (polycystic ovarian syndrome)   . Relapsing remitting multiple sclerosis ([place]) 11/25/2014   Patient says that all her MS symptoms went away in  2017-2018 after she was treated for tick-bourne illnesses    Past Surgical History:  Procedure Laterality Date  . CESAREAN SECTION  2008  . TONSILLECTOMY N/A 05/20/2016   Procedure: TONSILLECTOMY;  Surgeon: Margaretha Sheffield, MD;  Location: [facility];  Service: ENT;  Laterality: N/A;    Family History  Problem Relation Age of Onset  . Heart disease Maternal Grandmother 60       s/p CABG  . Dementia Maternal Grandmother   . Hypertension Mother   . Seizures Father   . Hyperlipidemia Father   . Achalasia Sister   . COPD Maternal Grandfather   . Diabetes Paternal Grandmother     Social History   Tobacco Use  . Smoking status: Never Smoker  . Smokeless tobacco: Never Used  . Tobacco comment: tobacco use - no  Vaping Use  . Vaping Use: Never used  Substance Use Topics  . Alcohol use: Yes    Comment: social - maybe 3 glasses of wine/month   . Drug use: No    No current facility-administered medications for this encounter.  Current Outpatient Medications:  .  amphetamine-dextroamphetamine (ADDERALL) 15 MG tablet, Take by mouth 2 (two) times daily as needed (for fatigue). , Disp: , Rfl:  .  Cholecalciferol (VITAMIN D3) 5000 UNITS CAPS, Take 10,000 Units by mouth daily. , Disp: , Rfl:  .  metroNIDAZOLE (FLAGYL) 500 MG tablet,  Take 1 tablet (500 mg total) by mouth 2 (two) times daily for 7 days., Disp: 14 tablet, Rfl: 0 .  phenazopyridine (PYRIDIUM) 200 MG tablet, Take 1 tablet (200 mg total) by mouth 3 (three) times daily as needed for pain., Disp: 6 tablet, Rfl: 0 .  carbamazepine (TEGRETOL XR) 100 MG 12 hr tablet, Take 100 mg by mouth 2 (two) times daily as needed (for TMJ)., Disp: , Rfl:  .  natalizumab (TYSABRI) 300 MG/15ML injection, Inject 300 mg into the vein every 28 (twenty-eight) days., Disp: , Rfl:  .  pantoprazole (PROTONIX) 40 MG tablet, Take 1 tablet (40 mg total) by mouth daily., Disp: 30 tablet, Rfl: 1 .  valACYclovir (VALTREX) 500 MG tablet, Take 1 tablet  (500 mg total) by mouth 2 (two) times daily. (Patient not taking: No sig reported), Disp: 14 tablet, Rfl: 3  No Known Allergies   ROS  As noted in HPI.   Physical Exam  BP 138/89 (BP Location: Left Arm)   Pulse 84   Temp 99.2 F (37.3 C) (Oral)   Resp 14   Ht 5\' 5"  (1.651 m)   Wt 83.9 kg   LMP 09/18/2020 (Approximate)   SpO2 100%   BMI 30.79 kg/m   Constitutional: Well developed, well nourished, no acute distress Eyes:  EOMI, conjunctiva normal bilaterally HENT: Normocephalic, atraumatic,mucus membranes moist Respiratory: Normal inspiratory effort Cardiovascular: Normal rate GI: nondistended, soft, nontender.  No suprapubic, flank tenderness.  No right, left lower quadrant tenderness. [place] no CVAT skin: No rash, skin intact Musculoskeletal: no deformities Neurologic: Alert & oriented x 3, no focal neuro deficits Psychiatric: Speech and behavior appropriate   ED Course   Medications - No data to display  Orders Placed This Encounter  Procedures  . Urine culture    Standing Status:   Standing    Number of Occurrences:   1    Order Specific Question:   List patient's active antibiotics    Answer:   cipro    Order Specific Question:   Patient immune status    Answer:   Normal  . Chlamydia/NGC rt PCR ([place] only)    Standing Status:   Standing    Number of Occurrences:   1    Order Specific Question:   Patient immune status    Answer:   Normal  . Wet prep, genital    Standing Status:   Standing    Number of Occurrences:   1    Order Specific Question:   Patient immune status    Answer:   Normal  . Urinalysis, Complete w Microscopic Urine, Clean Catch    Standing Status:   Standing    Number of Occurrences:   1  . RPR    Standing Status:   Standing    Number of Occurrences:   1  . HIV Antibody (routine testing w rflx)    Standing Status:   Standing    Number of Occurrences:   1    Results for orders placed or performed during the hospital encounter of  10/17/20 (from the past 24 hour(s))  Urinalysis, Complete w Microscopic Urine, Clean Catch     Status: Abnormal   Collection Time: 10/17/20  5:33 PM  Result Value Ref Range   Color, Urine YELLOW YELLOW   APPearance HAZY (A) CLEAR   Specific Gravity, Urine 1.025 1.005 - 1.030   pH 5.5 5.0 - 8.0   Glucose, UA NEGATIVE NEGATIVE mg/dL   Hgb urine  dipstick NEGATIVE NEGATIVE   Bilirubin Urine NEGATIVE NEGATIVE   Ketones, ur NEGATIVE NEGATIVE mg/dL   Protein, ur NEGATIVE NEGATIVE mg/dL   Nitrite NEGATIVE NEGATIVE   Leukocytes,Ua NEGATIVE NEGATIVE   Squamous Epithelial / LPF 6-10 0 - 5   WBC, UA 6-10 0 - 5 WBC/hpf   RBC / HPF 0-5 0 - 5 RBC/hpf   Bacteria, UA FEW (A) NONE SEEN   Mucus PRESENT   Wet prep, genital     Status: Abnormal   Collection Time: 10/17/20  6:05 PM   Specimen: Cervical/Vaginal swab  Result Value Ref Range   Yeast Wet Prep HPF POC NONE SEEN NONE SEEN   Trich, Wet Prep NONE SEEN NONE SEEN   Clue Cells Wet Prep HPF POC PRESENT (A) NONE SEEN   WBC, Wet Prep HPF POC FEW (A) NONE SEEN   Sperm NONE SEEN    No results found.  ED Clinical Impression  1. BV (bacterial vaginosis)      ED Assessment/Plan  Previous records, labs reviewed.  As noted in HPI.  UA contaminated.  She has a few bacteria and some WBCs.  No nitrites or esterase.  However will send this off for culture to confirm absence of UTI.  We will have her stop the Cipro for now.  Checking wet prep, gonorrhea, chlamydia, HIV, RPR.  Wet prep positive for BV.  Home with Flagyl, Pyridium.  Continue pushing fluids.  No yeast or trichomoniasis.  She decided to wait for the gonorrhea and Chlamydia labs prior to being treated.   will have her follow-up with a PMD of choice.  Giving primary care list and will order assistance in finding a PMD.  Discussed labs, MDM, treatment plan, and plan for follow-up with patient.  patient agrees with plan.   Meds ordered this encounter  Medications  . metroNIDAZOLE  (FLAGYL) 500 MG tablet    Sig: Take 1 tablet (500 mg total) by mouth 2 (two) times daily for 7 days.    Dispense:  14 tablet    Refill:  0  . phenazopyridine (PYRIDIUM) 200 MG tablet    Sig: Take 1 tablet (200 mg total) by mouth 3 (three) times daily as needed for pain.    Dispense:  6 tablet    Refill:  0      *This clinic note was created using [employer]. Therefore, there may be occasional mistakes despite careful proofreading.  ?    Melynda Ripple, MD 10/17/20 [phone]

## 2020-10-17 NOTE — Discharge Instructions (Addendum)
Your wet prep came back positive for BV.  This is not an STD.  This is easily treated with 1 week of Flagyl.  I suspect this is the cause of your symptoms.  Pyridium as needed for the urinary symptoms.  Gonorrhea chlamydia, HIV, syphilis will be back in several days.  We will contact you if any of these, if positive and require treatment.  No intercourse until you know what your labs are, and your symptoms have improved.  Here is a list of primary care providers who are taking new patients:  Dr. Otilio Miu [address] [place] [address] [place] [facility] [place], [place] [address] [phone]  [place] [address]  [phone]  [facility] [address] [place], [place] [address] [phone]  [facility] [place]  [phone] [place], [place] [address]  Here are clinics/ other resources who will see you if you do not have insurance. Some have certain criteria that you must meet. Call them and find out what they are:  [facility]: [address] [place], [place] [address] Phone: [phone] Hours: First and Third Saturdays of each Month, 9 a.m. - 1 p.m.  [facility]: [address], [place], [place], [place] [address] Phone: [phone] Hours: Tuesday, 4 p.m. - 8 p.m. Thursday, 1 p.m. - 8 p.m. Wednesday, 9 a.m. - [facility] [address], [place], [place] [address] Phone: [phone] Pharmacy Phone Number: [phone] Dental Phone Number: [phone] [place] Help: [phone]  Dental Hours: Monday - Thursday, 8 a.m. - 6 p.m.  [facility] [place] [address], [place], [place] [address] Phone: [phone] Pharmacy Phone Number: [phone] [facility] Insurance Help: [phone]  [facility] [address] [place], [place] [address] Phone:  [phone] Pharmacy Phone Number: [phone] [facility] Insurance Help: [phone]  [facility] [address], [place] [address] Phone: [phone] [facility] Insurance Help: [phone]   [address] [place], [place] [address] Phone: [phone]  Go to www.goodrx.com to look up your medications. This will give you a list of where you can find your prescriptions at the most affordable prices. Or ask the pharmacist what the cash price is, or if they have any other discount programs available to help make your medication more affordable. This can be less expensive than what you would pay with insurance.

## 2020-10-17 NOTE — ED Triage Notes (Signed)
Patient c/o burning when urinating and aching in her lower back for the past 2 days.

## 2020-10-18 LAB — RPR: RPR Ser Ql: NONREACTIVE

## 2020-10-19 LAB — URINE CULTURE
Culture: 60000 — AB
Special Requests: NORMAL

## 2020-10-21 ENCOUNTER — Telehealth: Payer: Self-pay

## 2020-10-21 NOTE — Telephone Encounter (Signed)
-----   Message from Curt Jews, RN sent at 10/21/2020  2:33 PM EDT ----- Regarding: UC to PCP Patient needs to establish with PCP - routine

## 2020-10-21 NOTE — Telephone Encounter (Signed)
Left message to set up pcp.

## 2020-11-03 DIAGNOSIS — E559 Vitamin D deficiency, unspecified: Secondary | ICD-10-CM | POA: Diagnosis not present

## 2020-11-03 DIAGNOSIS — E538 Deficiency of other specified B group vitamins: Secondary | ICD-10-CM | POA: Diagnosis not present

## 2020-11-03 DIAGNOSIS — E782 Mixed hyperlipidemia: Secondary | ICD-10-CM | POA: Diagnosis not present

## 2020-11-03 DIAGNOSIS — R635 Abnormal weight gain: Secondary | ICD-10-CM | POA: Diagnosis not present

## 2020-11-03 DIAGNOSIS — E2740 Unspecified adrenocortical insufficiency: Secondary | ICD-10-CM | POA: Diagnosis not present

## 2020-11-03 DIAGNOSIS — N926 Irregular menstruation, unspecified: Secondary | ICD-10-CM | POA: Diagnosis not present

## 2020-11-05 DIAGNOSIS — E559 Vitamin D deficiency, unspecified: Secondary | ICD-10-CM | POA: Diagnosis not present

## 2020-11-05 DIAGNOSIS — A449 Bartonellosis, unspecified: Secondary | ICD-10-CM | POA: Diagnosis not present

## 2020-11-05 DIAGNOSIS — R350 Frequency of micturition: Secondary | ICD-10-CM | POA: Diagnosis not present

## 2020-11-05 DIAGNOSIS — E2740 Unspecified adrenocortical insufficiency: Secondary | ICD-10-CM | POA: Diagnosis not present

## 2020-11-05 DIAGNOSIS — R5382 Chronic fatigue, unspecified: Secondary | ICD-10-CM | POA: Diagnosis not present

## 2020-11-19 ENCOUNTER — Other Ambulatory Visit: Payer: Self-pay

## 2020-11-19 ENCOUNTER — Ambulatory Visit: Payer: 59 | Admitting: Nurse Practitioner

## 2020-11-19 ENCOUNTER — Encounter: Payer: Self-pay | Admitting: Nurse Practitioner

## 2020-11-19 VITALS — BP 145/76 | HR 81 | Temp 99.4°F | Ht 65.98 in | Wt 192.1 lb

## 2020-11-19 DIAGNOSIS — Z7689 Persons encountering health services in other specified circumstances: Secondary | ICD-10-CM

## 2020-11-19 DIAGNOSIS — Z87442 Personal history of urinary calculi: Secondary | ICD-10-CM

## 2020-11-19 DIAGNOSIS — N390 Urinary tract infection, site not specified: Secondary | ICD-10-CM

## 2020-11-19 LAB — URINALYSIS, ROUTINE W REFLEX MICROSCOPIC
Bilirubin, UA: NEGATIVE
Glucose, UA: NEGATIVE
Ketones, UA: NEGATIVE
Leukocytes,UA: NEGATIVE
Nitrite, UA: NEGATIVE
Protein,UA: NEGATIVE
RBC, UA: NEGATIVE
Specific Gravity, UA: 1.025 (ref 1.005–1.030)
Urobilinogen, Ur: 0.2 mg/dL (ref 0.2–1.0)
pH, UA: 5 (ref 5.0–7.5)

## 2020-11-19 NOTE — Progress Notes (Signed)
BP (!) 145/76   Pulse 81   Temp 99.4 F (37.4 C)   Ht 5' 5.98" (1.676 m)   Wt 192 lb 2 oz (87.1 kg)   LMP 10/20/2020 (Approximate)   SpO2 98%   BMI 31.02 kg/m    Subjective:    Patient ID: Shannon Davidson, female    DOB: 16-May-1981, 40 y.o.   MRN: [id number]  HPI: Shannon Davidson is a 40 y.o. female  Chief Complaint  Patient presents with  . chronic uti  . discuss possible kidney stones    Chronic uti, blood in urine, lower abdominal pain and urgency    Patient presents to clinic to establish care with new PCP.  Patient reports a history of tick born illnesses that she sees [facility] who has been keeping her symptoms down since she was diagnosed in 2018 . Patient does have a history of PCOS and hyperlipidemia.   Patient states has a history of UTIs that started in September of 2021.  She has had 4-5 episodes of UTI.  Several have been positive for RBCs.  It was thought at one point that she might be having kidney stones.  Patient does have a history of Kidney stones.  Patient is having abdominal/pelvic pressure today.  Denies dysuria, back pain, foul odor in her urine, and urgency.  She reports overall just not feeling well at visit today.   Patient denies a history of: Hypertension, diabetes. Thyroid problems, Depression, Anxiety, Neurological problems, and Abdominal problems.    Denies HA, CP, SOB, dizziness, palpitations, visual changes, and lower extremity swelling.   Relevant past medical, surgical, family and social history reviewed and updated as indicated. Interim medical history since our last visit reviewed. Allergies and medications reviewed and updated.  Review of Systems  Eyes: Negative for visual disturbance.  Respiratory: Negative for cough, chest tightness and shortness of breath.   Cardiovascular: Negative for chest pain, palpitations and leg swelling.  Genitourinary: Positive for pelvic pain. Negative for dysuria, frequency and urgency.        Abdominal/pelvic pressure  Musculoskeletal: Negative for back pain.  Neurological: Negative for dizziness and headaches.    Per HPI unless specifically indicated above     Objective:    BP (!) 145/76   Pulse 81   Temp 99.4 F (37.4 C)   Ht 5' 5.98" (1.676 m)   Wt 192 lb 2 oz (87.1 kg)   LMP 10/20/2020 (Approximate)   SpO2 98%   BMI 31.02 kg/m   Wt Readings from Last 3 Encounters:  11/19/20 192 lb 2 oz (87.1 kg)  10/17/20 185 lb (83.9 kg)  09/28/20 190 lb (86.2 kg)    Physical Exam Vitals and nursing note reviewed.  Constitutional:      General: She is not in acute distress.    Appearance: Normal appearance. She is normal weight. She is not ill-appearing, toxic-appearing or diaphoretic.  HENT:     Head: Normocephalic.     Right Ear: External ear normal.     Left Ear: External ear normal.     Nose: Nose normal.     Mouth/Throat:     Mouth: Mucous membranes are moist.     Pharynx: Oropharynx is clear.  Eyes:     General:        Right eye: No discharge.        Left eye: No discharge.     Extraocular Movements: Extraocular movements intact.     Conjunctiva/sclera: Conjunctivae  normal.     Pupils: Pupils are equal, round, and reactive to light.  Cardiovascular:     Rate and Rhythm: Normal rate and regular rhythm.     Heart sounds: No murmur heard.   Pulmonary:     Effort: Pulmonary effort is normal. No respiratory distress.     Breath sounds: Normal breath sounds. No wheezing or rales.  Abdominal:     General: Abdomen is flat. Bowel sounds are normal. There is no distension.     Palpations: Abdomen is soft.     Tenderness: There is no abdominal tenderness. There is no right CVA tenderness, left CVA tenderness or guarding.  Musculoskeletal:     Cervical back: Normal range of motion and neck supple.  Skin:    General: Skin is warm and dry.     Capillary Refill: Capillary refill takes less than 2 seconds.  Neurological:     General: No focal deficit present.      Mental Status: She is alert and oriented to person, place, and time. Mental status is at baseline.  Psychiatric:        Mood and Affect: Mood normal.        Behavior: Behavior normal.        Thought Content: Thought content normal.        Judgment: Judgment normal.     Results for orders placed or performed during the hospital encounter of 10/17/20  Urine culture   Specimen: Urine, Clean Catch  Result Value Ref Range   Specimen Description      URINE, CLEAN CATCH Performed at [facility], [address] [place], [place] [address]    Special Requests      cipro Normal Performed at [facility], [address], [place], [place] [address]    Culture (A)     60,000 COLONIES/mL LACTOBACILLUS SPECIES Standardized susceptibility testing for this organism is not available. Performed at [facility], [place] [address], [place], [place] [address]    Report Status 10/19/2020 FINAL   Chlamydia/NGC rt PCR ([facility] only)   Specimen: Cervical/Vaginal swab  Result Value Ref Range   Specimen source GC/Chlam ENDOCERVICAL    Chlamydia Tr NOT DETECTED NOT DETECTED   N gonorrhoeae NOT DETECTED NOT DETECTED  Wet prep, genital   Specimen: Cervical/Vaginal swab  Result Value Ref Range   Yeast Wet Prep HPF POC NONE SEEN NONE SEEN   Trich, Wet Prep NONE SEEN NONE SEEN   Clue Cells Wet Prep HPF POC PRESENT (A) NONE SEEN   WBC, Wet Prep HPF POC FEW (A) NONE SEEN   Sperm NONE SEEN   Urinalysis, Complete w Microscopic Urine, Clean Catch  Result Value Ref Range   Color, Urine YELLOW YELLOW   APPearance HAZY (A) CLEAR   Specific Gravity, Urine 1.025 1.005 - 1.030   pH 5.5 5.0 - 8.0   Glucose, UA NEGATIVE NEGATIVE mg/dL   Hgb urine dipstick NEGATIVE NEGATIVE   Bilirubin Urine NEGATIVE NEGATIVE   Ketones, ur NEGATIVE NEGATIVE mg/dL   Protein, ur NEGATIVE NEGATIVE mg/dL   Nitrite NEGATIVE NEGATIVE   Leukocytes,Ua NEGATIVE NEGATIVE   Squamous Epithelial / LPF 6-10 0 - 5    WBC, UA 6-10 0 - 5 WBC/hpf   RBC / HPF 0-5 0 - 5 RBC/hpf   Bacteria, UA FEW (A) NONE SEEN   Mucus PRESENT   RPR  Result Value Ref Range   RPR Ser Ql NON REACTIVE NON REACTIVE  HIV Antibody (routine testing w rflx)  Result Value Ref Range   HIV Screen 4th Generation wRfx Non Reactive Non Reactive      Assessment & Plan:   Problem List Items Addressed This Visit   None   Visit Diagnoses    Recurrent UTI    -  Primary   UA/UC ordered today. Renal US ordered to rule out kidney stones. Referral placed for Urology due to recurrent UTI. Will make recommendations based on results.   Relevant Orders   Urinalysis, Routine w reflex microscopic   Urine Culture   Ambulatory referral to Urology   US Renal   History of kidney stones       Relevant Orders   Urinalysis, Routine w reflex microscopic   Urine Culture   Ambulatory referral to Urology   US Renal   Encounter to establish care           Follow up plan: Return for Will follow up after imaging and urology appointment..    A total of 40 minutes were spent on this encounter today.  When total time is documented, this includes both the face-to-face and non-face-to-face time personally spent before, during and after the visit on the date of the encounter.

## 2020-11-20 DIAGNOSIS — Z91012 Allergy to eggs: Secondary | ICD-10-CM | POA: Diagnosis not present

## 2020-11-20 DIAGNOSIS — Z887 Allergy status to serum and vaccine status: Secondary | ICD-10-CM | POA: Diagnosis not present

## 2020-11-20 DIAGNOSIS — R109 Unspecified abdominal pain: Secondary | ICD-10-CM | POA: Diagnosis not present

## 2020-11-20 DIAGNOSIS — Z20822 Contact with and (suspected) exposure to covid-19: Secondary | ICD-10-CM | POA: Diagnosis not present

## 2020-11-20 DIAGNOSIS — R319 Hematuria, unspecified: Secondary | ICD-10-CM | POA: Diagnosis not present

## 2020-11-20 DIAGNOSIS — N390 Urinary tract infection, site not specified: Secondary | ICD-10-CM | POA: Diagnosis not present

## 2020-11-20 DIAGNOSIS — N136 Pyonephrosis: Secondary | ICD-10-CM | POA: Diagnosis not present

## 2020-11-20 DIAGNOSIS — E669 Obesity, unspecified: Secondary | ICD-10-CM | POA: Diagnosis not present

## 2020-11-20 DIAGNOSIS — N201 Calculus of ureter: Secondary | ICD-10-CM | POA: Diagnosis not present

## 2020-11-20 DIAGNOSIS — N23 Unspecified renal colic: Secondary | ICD-10-CM | POA: Diagnosis not present

## 2020-11-20 DIAGNOSIS — Z6831 Body mass index (BMI) 31.0-31.9, adult: Secondary | ICD-10-CM | POA: Diagnosis not present

## 2020-11-20 DIAGNOSIS — N132 Hydronephrosis with renal and ureteral calculous obstruction: Secondary | ICD-10-CM | POA: Diagnosis not present

## 2020-11-20 NOTE — Progress Notes (Signed)
Hi Shannon Davidson.  Your UA from yesterday was normal.  I changed the [place] to STAT because the wait for routine is until June.  Someone should be in touch about the [place].  Let me know if you have any questions.

## 2020-11-20 NOTE — Addendum Note (Signed)
Addended by: Jon Billings on: 11/20/2020 08:59 AM   Modules accepted: Orders

## 2020-11-23 DIAGNOSIS — R10814 Left lower quadrant abdominal tenderness: Secondary | ICD-10-CM | POA: Diagnosis not present

## 2020-11-23 DIAGNOSIS — R1032 Left lower quadrant pain: Secondary | ICD-10-CM | POA: Diagnosis not present

## 2020-11-23 DIAGNOSIS — R509 Fever, unspecified: Secondary | ICD-10-CM | POA: Diagnosis not present

## 2020-11-23 DIAGNOSIS — N200 Calculus of kidney: Secondary | ICD-10-CM | POA: Diagnosis not present

## 2020-11-23 DIAGNOSIS — R110 Nausea: Secondary | ICD-10-CM | POA: Diagnosis not present

## 2020-11-23 DIAGNOSIS — Z91012 Allergy to eggs: Secondary | ICD-10-CM | POA: Diagnosis not present

## 2020-11-23 DIAGNOSIS — E785 Hyperlipidemia, unspecified: Secondary | ICD-10-CM | POA: Diagnosis not present

## 2020-11-23 DIAGNOSIS — Z466 Encounter for fitting and adjustment of urinary device: Secondary | ICD-10-CM | POA: Diagnosis not present

## 2020-11-24 ENCOUNTER — Telehealth: Payer: Self-pay

## 2020-11-24 ENCOUNTER — Other Ambulatory Visit: Payer: Self-pay | Admitting: *Deleted

## 2020-11-24 MED ORDER — NITROFURANTOIN MONOHYD MACRO 100 MG PO CAPS: 100.0000 mg | ORAL_CAPSULE | Freq: Two times a day (BID) | ORAL | 0 refills | Status: DC

## 2020-11-24 NOTE — Patient Outreach (Signed)
[facility]) Care Management  11/24/2020  Shannon Davidson 11-Apr-1981 [id number]   Transition of care telephone call  Referral received:11/24/20 Initial outreach:11/24/20 Insurance: [employer]  Initial unsuccessful telephone call to patient's preferred number in order to complete transition of care assessment; no answer, left HIPAA compliant voicemail message requesting return call.   Objective: Per electronic medical record, Shannon Davidson  was hospitalized at [facility] 7/16-9/67/89 for Renal colic left side, UTI with hematuria, Left cystourethroscopy, left ureteral stent   Comorbidities include: Hyperlipidemia, polycystic ovarian syndrome She  was discharged to home on 11/21/20  without the need for home health services or DME.   Plan: This RNCM will route unsuccessful outreach letter with [place] Management pamphlet and 24 hour Nurse Advice Line Magnet to [place] Management clinical pool to be mailed to patient's home address. This RNCM will attempt another outreach within 4 business days.  Joylene Draft, RN, BSN  [place] Management Coordinator  [phone]- Mobile [phone]- Toll Free Main Office

## 2020-11-24 NOTE — Progress Notes (Signed)
Hi Shannon Davidson. Your urine culture came back with bacteria growing despite the urinalysis.  I am going to go ahead and send you an antibiotic to the pharmacy for you.  Once the culture comes back I may need to change it.  But it may take another day or so.

## 2020-11-24 NOTE — Telephone Encounter (Signed)
Incoming call from pt on triage line who states that she was scheduled to establish care with Dr. Bernardo Heater for stone management however had acute stone episode and ended up going to [facility] for care. [facility] currently has her on antibiotics with stent placement and surgery scheduled. She questions if she needs to keep scheduled follow up w/ [facility]. Advised pt that she needs to continue care with [facility] as they have already started course of care. Advised pt that we are happy to see her at our practice for ongoing stone management after the resolution of this acute stone episode. Pt gave verbal understanding and questions when she should follow up, advised pt to call office back after post op appt w/ [facility]. Patient gave verbal understanding.

## 2020-11-24 NOTE — Addendum Note (Signed)
Addended by: Jon Billings on: 11/24/2020 08:44 AM   Modules accepted: Orders

## 2020-11-25 ENCOUNTER — Ambulatory Visit (HOSPITAL_COMMUNITY): Payer: 59

## 2020-11-25 LAB — URINE CULTURE

## 2020-11-26 MED ORDER — CIPROFLOXACIN HCL 500 MG PO TABS: 500.0000 mg | ORAL_TABLET | Freq: Two times a day (BID) | ORAL | 0 refills | Status: DC

## 2020-11-26 NOTE — Progress Notes (Signed)
Hi Shannon Davidson.  I am going to change your antibiotic to ciprofloxacin to see if we can get this taken care of.  I haven't seen the results of your ultrasound yet. Have you heard about an appointment?

## 2020-11-26 NOTE — Addendum Note (Signed)
Addended by: Jon Billings on: 11/26/2020 09:04 AM   Modules accepted: Orders

## 2020-11-26 NOTE — Telephone Encounter (Signed)
Patient called the office again today. According to her, Shannon Davidson is having trouble getting her surgery scheduled timely due to staffing. She is requesting an appointment with Dr. Bernardo Heater for a surgery consult and possibly scheduling her stone surgery in the next week. She is leaving for vacation on 5/27 and wants to have it prior to vacation.  Per Dr. Bernardo Heater, add to his clinic schedule this week and he has 1 opening for surgery on 5/24 to add her on.

## 2020-11-27 ENCOUNTER — Other Ambulatory Visit: Payer: Self-pay | Admitting: *Deleted

## 2020-11-27 ENCOUNTER — Other Ambulatory Visit: Payer: Self-pay

## 2020-11-27 ENCOUNTER — Other Ambulatory Visit
Admission: RE | Admit: 2020-11-27 | Discharge: 2020-11-27 | Disposition: A | Discharge status: Home / Self Care | Payer: 59 | Attending: Urology | Admitting: Urology

## 2020-11-27 ENCOUNTER — Ambulatory Visit (INDEPENDENT_AMBULATORY_CARE_PROVIDER_SITE_OTHER): Payer: 59 | Admitting: Urology

## 2020-11-27 ENCOUNTER — Other Ambulatory Visit: Payer: Self-pay | Admitting: Urology

## 2020-11-27 ENCOUNTER — Encounter: Payer: Self-pay | Admitting: Urology

## 2020-11-27 VITALS — BP 147/87 | HR 88 | Ht 66.0 in | Wt 185.0 lb

## 2020-11-27 DIAGNOSIS — N201 Calculus of ureter: Secondary | ICD-10-CM

## 2020-11-27 DIAGNOSIS — N200 Calculus of kidney: Secondary | ICD-10-CM

## 2020-11-27 NOTE — Progress Notes (Signed)
11/27/2020 1:00 PM   Shannon Davidson Apr 17, 1981 [id number]  Referring provider: Jon Billings, NP [address] [place],  [place] [address]  Chief Complaint  Patient presents with  . Other    HPI: Shannon Davidson is a 40 y.o. female who presents for management of a left proximal ureteral calculus.   Presented to ED at [facility] on 11/20/2020 with a several day history of left flank pain  Stone protocol CT remarkable for a 4 mm left proximal ureteral calculus with mild to moderate hydronephrosis/hydroureter  Urinalysis did show pyuria and was nitrite positive  She had no fever or chills  She underwent placement of a left ureteral stent 2/69/4854 without complications with resolution of her pain  Discharged 11/21/2020 on a 12-day course of cefdinir and was instructed to call [facility] to schedule definitive stone treatment in 2-6 weeks  Urine culture grew Klebsiella pneumonie that was sensitive to second and third generation cephalosporins, tetracycline, fluoroquinolones and Septra  Currently doing well; moderate stent irritative symptoms  She called [facility] and due to staffing issues was told the earliest her surgery could be performed was in early June 2022  She contacted our office to see if ureteroscopy could possibly be performed earlier    PMH: Past Medical History:  Diagnosis Date  . Babesiasis   . Bartonella infection   . Elevated EBV antibody titer   . HLD (hyperlipidemia)   . Kidney stone    Dr. Bernardo Heater  . Lyme disease   . Palpitations   . Pap smear for cervical cancer screening 2012   Dr. Benjie Karvonen  . PCOS (polycystic ovarian syndrome)   . Relapsing remitting multiple sclerosis ([place]) 11/25/2014   Patient says that all her MS symptoms went away in 2017-2018 after she was treated for tick-bourne illnesses    Surgical History: Past Surgical History:  Procedure Laterality Date  . APPENDECTOMY  09/2019  . CESAREAN SECTION  2008  . LITHOTRIPSY    .  TONSILLECTOMY N/A 05/20/2016   Procedure: TONSILLECTOMY;  Surgeon: Margaretha Sheffield, MD;  Location: [place];  Service: ENT;  Laterality: N/A;    Home Medications:  Allergies as of 11/27/2020      Reactions   Albumen, Egg Swelling   Influenza Vaccines Swelling      Medication List       Accurate as of Nov 27, 2020  1:00 PM. If you have any questions, ask your nurse or doctor.        STOP taking these medications   ciprofloxacin 500 MG tablet Commonly known as: Cipro Stopped by: Abbie Sons, MD   nitrofurantoin (macrocrystal-monohydrate) 100 MG capsule Commonly known as: Macrobid Stopped by: Abbie Sons, MD     TAKE these medications   acetaminophen 500 MG tablet Commonly known as: TYLENOL Take 1,000 mg by mouth every 6 (six) hours as needed for mild pain or moderate pain.   cefdinir 300 MG capsule Commonly known as: OMNICEF Take 300 mg by mouth 2 (two) times daily.   DSS 100 MG Caps Take 100 mg by mouth daily.   Fish Oil 1000 MG Cpdr Take 1,000 mg by mouth in the morning and at bedtime.   MULTIVITAMIN ADULT PO Take 1 tablet by mouth daily.   oxybutynin 5 MG tablet Commonly known as: DITROPAN Take 5 mg by mouth daily as needed for bladder spasms.   oxyCODONE 5 MG immediate release tablet Commonly known as: Oxy IR/ROXICODONE Take 5 mg by mouth  every 6 (six) hours as needed for severe pain or moderate pain.   tamsulosin 0.4 MG Caps capsule Commonly known as: FLOMAX Take 0.4 mg by mouth daily.   Vitamin D3 125 MCG (5000 UT) Caps Take 10,000 Units by mouth daily.       Allergies:  Allergies  Allergen Reactions  . Albumen, Egg Swelling  . Influenza Vaccines Swelling    Family History: Family History  Problem Relation Age of Onset  . Heart disease Maternal Grandmother 60       s/p CABG  . Dementia Maternal Grandmother   . Hypertension Mother   . Seizures Father   . Hyperlipidemia Father   . Achalasia Sister   . COPD Maternal  Grandfather   . Diabetes Paternal Grandmother     Social History:  reports that she has never smoked. She has never used smokeless tobacco. She reports current alcohol use. She reports that she does not use drugs.   Physical Exam: BP (!) 147/87   Pulse 88   Ht 5\' 6"  (1.676 m)   Wt 185 lb (83.9 kg)   BMI 29.86 kg/m   Constitutional:  Alert and oriented, No acute distress. HEENT: [place] AT, moist mucus membranes.  Trachea midline, no masses. Cardiovascular: No clubbing, cyanosis, or edema. Respiratory: Normal respiratory effort, no increased work of breathing. Skin: No rashes, bruises or suspicious lesions. Neurologic: Grossly intact, no focal deficits, moving all 4 extremities. Psychiatric: Normal mood and affect.   Pertinent Imaging: CT images were personally reviewed and interpreted on [facility].  Her left proximal ureteral calculus measures 4 x 8 mm.  She also has 3 left renal calculi largest in the lower pole measuring ~ 10 mm   Assessment & Plan:    1.  Left proximal ureteral calculus  Desires to schedule ureteroscopic stone removal at [facility] and I do have time available next week.  The procedure was discussed including potential risks bleeding, infection, ureteral injury/perforation and ureteral stricture.  Urine culture ordered today  2.  Left nephrolithiasis  She had left the office after I reviewed her CT and if she desires will attempt to treat her left-sided renal calculi however this may require a longer indwelling stent time.   Abbie Sons, [place] [address] [place], [place] [address] [phone]

## 2020-11-27 NOTE — Patient Outreach (Signed)
[facility]) Care Management  11/27/2020  Shannon Davidson 1981/05/12 [id number]   Transition of care call Referral received: 11/24/20 Initial outreach attempt: 11/24/20 Insurance: [employer]   2nd unsuccessful telephone call to patient's preferred contact number in order to complete post hospital discharge transition of care assessment , no answer left HIPAA compliant message requesting return call.    Objective: Per electronic medical record, Shannon Davidson was hospitalized [facility] 7/59-1/63/84 for Renal colic left side, UTI with hematuria, Left cystourethroscopy, left ureteral stent , 70mm stone  Comorbidities include: Hyperlipidemia, polycystic ovarian syndrome She  was discharged to home on 11/21/20  without the need for home health servicesor DME.  Plan If no return call from patient will attempt 3rd outreach in the next 4 business days.    Shannon Draft, RN, BSN  [place] Management Coordinator  [phone]- Mobile [phone]- Toll Free Main Office

## 2020-11-27 NOTE — H&P (View-Only) (Signed)
11/27/2020 1:00 PM   Shannon Davidson Feb 10, 1981 [id number]  Referring provider: Jon Billings, NP [address] [place],  [place] [address]  Chief Complaint  Patient presents with  . Other    HPI: Shannon Davidson is a 40 y.o. female who presents for management of a left proximal ureteral calculus.   Presented to ED at [facility] on 11/20/2020 with a several day history of left flank pain  Stone protocol CT remarkable for a 4 mm left proximal ureteral calculus with mild to moderate hydronephrosis/hydroureter  Urinalysis did show pyuria and was nitrite positive  She had no fever or chills  She underwent placement of a left ureteral stent 0/62/3762 without complications with resolution of her pain  Discharged 11/21/2020 on a 12-day course of cefdinir and was instructed to call [facility] to schedule definitive stone treatment in 2-6 weeks  Urine culture grew Klebsiella pneumonie that was sensitive to second and third generation cephalosporins, tetracycline, fluoroquinolones and Septra  Currently doing well; moderate stent irritative symptoms  She called [facility] and due to staffing issues was told the earliest her surgery could be performed was in early June 2022  She contacted our office to see if ureteroscopy could possibly be performed earlier    PMH: Past Medical History:  Diagnosis Date  . Babesiasis   . Bartonella infection   . Elevated EBV antibody titer   . HLD (hyperlipidemia)   . Kidney stone    Dr. Bernardo Heater  . Lyme disease   . Palpitations   . Pap smear for cervical cancer screening 2012   Dr. Benjie Karvonen  . PCOS (polycystic ovarian syndrome)   . Relapsing remitting multiple sclerosis ([place]) 11/25/2014   Patient says that all her MS symptoms went away in 2017-2018 after she was treated for tick-bourne illnesses    Surgical History: Past Surgical History:  Procedure Laterality Date  . APPENDECTOMY  09/2019  . CESAREAN SECTION  2008  . LITHOTRIPSY    .  TONSILLECTOMY N/A 05/20/2016   Procedure: TONSILLECTOMY;  Surgeon: Margaretha Sheffield, MD;  Location: [place];  Service: ENT;  Laterality: N/A;    Home Medications:  Allergies as of 11/27/2020      Reactions   Albumen, Egg Swelling   Influenza Vaccines Swelling      Medication List       Accurate as of Nov 27, 2020  1:00 PM. If you have any questions, ask your nurse or doctor.        STOP taking these medications   ciprofloxacin 500 MG tablet Commonly known as: Cipro Stopped by: Abbie Sons, MD   nitrofurantoin (macrocrystal-monohydrate) 100 MG capsule Commonly known as: Macrobid Stopped by: Abbie Sons, MD     TAKE these medications   acetaminophen 500 MG tablet Commonly known as: TYLENOL Take 1,000 mg by mouth every 6 (six) hours as needed for mild pain or moderate pain.   cefdinir 300 MG capsule Commonly known as: OMNICEF Take 300 mg by mouth 2 (two) times daily.   DSS 100 MG Caps Take 100 mg by mouth daily.   Fish Oil 1000 MG Cpdr Take 1,000 mg by mouth in the morning and at bedtime.   MULTIVITAMIN ADULT PO Take 1 tablet by mouth daily.   oxybutynin 5 MG tablet Commonly known as: DITROPAN Take 5 mg by mouth daily as needed for bladder spasms.   oxyCODONE 5 MG immediate release tablet Commonly known as: Oxy IR/ROXICODONE Take 5 mg by mouth  every 6 (six) hours as needed for severe pain or moderate pain.   tamsulosin 0.4 MG Caps capsule Commonly known as: FLOMAX Take 0.4 mg by mouth daily.   Vitamin D3 125 MCG (5000 UT) Caps Take 10,000 Units by mouth daily.       Allergies:  Allergies  Allergen Reactions  . Albumen, Egg Swelling  . Influenza Vaccines Swelling    Family History: Family History  Problem Relation Age of Onset  . Heart disease Maternal Grandmother 60       s/p CABG  . Dementia Maternal Grandmother   . Hypertension Mother   . Seizures Father   . Hyperlipidemia Father   . Achalasia Sister   . COPD Maternal  Grandfather   . Diabetes Paternal Grandmother     Social History:  reports that she has never smoked. She has never used smokeless tobacco. She reports current alcohol use. She reports that she does not use drugs.   Physical Exam: BP (!) 147/87   Pulse 88   Ht 5\' 6"  (1.676 m)   Wt 185 lb (83.9 kg)   BMI 29.86 kg/m   Constitutional:  Alert and oriented, No acute distress. HEENT: [place] AT, moist mucus membranes.  Trachea midline, no masses. Cardiovascular: No clubbing, cyanosis, or edema. Respiratory: Normal respiratory effort, no increased work of breathing. Skin: No rashes, bruises or suspicious lesions. Neurologic: Grossly intact, no focal deficits, moving all 4 extremities. Psychiatric: Normal mood and affect.   Pertinent Imaging: CT images were personally reviewed and interpreted on [facility].  Her left proximal ureteral calculus measures 4 x 8 mm.  She also has 3 left renal calculi largest in the lower pole measuring ~ 10 mm   Assessment & Plan:    1.  Left proximal ureteral calculus  Desires to schedule ureteroscopic stone removal at [facility] and I do have time available next week.  The procedure was discussed including potential risks bleeding, infection, ureteral injury/perforation and ureteral stricture.  Urine culture ordered today  2.  Left nephrolithiasis  She had left the office after I reviewed her CT and if she desires will attempt to treat her left-sided renal calculi however this may require a longer indwelling stent time.   Abbie Sons, [place] [address] [place], [place] [address] [phone]

## 2020-11-28 ENCOUNTER — Encounter: Payer: Self-pay | Admitting: Urology

## 2020-11-28 ENCOUNTER — Other Ambulatory Visit: Payer: Self-pay | Admitting: Urology

## 2020-11-28 LAB — URINE CULTURE: Culture: NO GROWTH

## 2020-11-28 MED ORDER — OXYCODONE HCL 5 MG PO TABS: 5.0000 mg | ORAL_TABLET | Freq: Four times a day (QID) | ORAL | 0 refills | Status: DC | PRN

## 2020-12-01 ENCOUNTER — Encounter
Admission: RE | Admit: 2020-12-01 | Discharge: 2020-12-01 | Disposition: A | Discharge status: Home / Self Care | Payer: 59 | Source: Ambulatory Visit | Attending: Urology | Admitting: Urology

## 2020-12-01 ENCOUNTER — Other Ambulatory Visit: Payer: Self-pay

## 2020-12-01 HISTORY — DX: Personal history of urinary calculi: Z87.442

## 2020-12-01 NOTE — Patient Instructions (Signed)
Your procedure is scheduled on: 12/02/20 Report to [place]. You must check in at the Admitting desk prior to going to the 2nd floor To find out your arrival time please call [phone] between 1PM - 3PM on 12/01/20.  Remember: Instructions that are not followed completely may result in serious medical risk, up to and including death, or upon the discretion of your surgeon and anesthesiologist your surgery may need to be rescheduled.     _X__ 1. Do not eat food or drink any liquids after midnight the night before your procedure.                 No gum chewing or hard candies.   __X__2.  On the morning of surgery brush your teeth with toothpaste and water, you                 may rinse your mouth with mouthwash if you wish.  Do not swallow any              toothpaste of mouthwash.     _X__ 3.  No Alcohol for 24 hours before or after surgery.   _X__ 4.  Do Not Smoke or use e-cigarettes For 24 Hours Prior to Your Surgery.                 Do not use any chewable tobacco products for at least 6 hours prior to                 surgery.  ____  5.  Bring all medications with you on the day of surgery if instructed.   __X__  6.  Notify your doctor if there is any change in your medical condition      (cold, fever, infections).     Do not wear jewelry, make-up or eye make-up, hairpins, clips or nail polish. Do not wear lotions, powders, or perfumes. Ok to use deodorant Do not shave 48 hours prior to surgery. Men may shave face and neck. Do not bring valuables to the hospital.    [facility] is not responsible for any belongings or valuables.  Contacts, dentures/partials or body piercings may not be worn into surgery. Bring a case for your contacts, glasses or hearing aids, a denture cup will be supplied. Leave your suitcase in the car. After surgery it may be brought to your room. For patients admitted to the hospital, discharge time is  determined by your treatment team.   Patients discharged the day of surgery will not be allowed to drive home.   Please read over the following fact sheets that you were given:     __X__ Take these medicines the morning of surgery with A SIP OF WATER:    1. oxybutynin (DITROPAN) 5 MG tablet  2. oxyCODONE (OXY IR/ROXICODONE) 5 MG immediate release tablet  3.   4.  5.  6. Take tamsulosin/flomax evening before as normal  ____ Fleet Enema (as directed)   ____ Use CHG Soap/SAGE wipes as directed  ____ Use inhalers on the day of surgery  ____ Stop metformin/Janumet/Farxiga 2 days prior to surgery    ____ Take 1/2 of usual insulin dose the night before surgery. No insulin the morning          of surgery.   ____ Stop Blood Thinners Coumadin/Plavix/Xarelto/Pleta/Pradaxa/Eliquis/Effient/Aspirin  on   Or contact your Surgeon, Cardiologist or Medical Doctor regarding  ability to stop your  blood thinners  __X__ Stop Anti-inflammatories 7 days before surgery such as Advil, Ibuprofen, Motrin,  BC or Goodies Powder, Naprosyn, Naproxen, Aleve, Aspirin    __X__ Stop all herbal supplements, fish oil or vitamin E until after surgery.    ____ Bring C-Pap to the hospital.     Instructions given over the phone, understanding verbalized. Patient aware these instructions will be available for reference in her My Chart as an AVS for today's phone interview./cln

## 2020-12-02 ENCOUNTER — Encounter
Admission: RE | Disposition: A | Discharge status: Home / Self Care | Payer: Self-pay | Source: Home / Self Care | Attending: Urology

## 2020-12-02 ENCOUNTER — Encounter: Payer: Self-pay | Admitting: Urology

## 2020-12-02 ENCOUNTER — Other Ambulatory Visit: Payer: Self-pay

## 2020-12-02 ENCOUNTER — Ambulatory Visit: Payer: 59 | Admitting: Anesthesiology

## 2020-12-02 ENCOUNTER — Ambulatory Visit: Payer: 59

## 2020-12-02 ENCOUNTER — Ambulatory Visit
Admission: RE | Admit: 2020-12-02 | Discharge: 2020-12-02 | Disposition: A | Discharge status: Home / Self Care | Payer: 59 | Attending: Urology | Admitting: Urology

## 2020-12-02 DIAGNOSIS — N201 Calculus of ureter: Secondary | ICD-10-CM | POA: Diagnosis not present

## 2020-12-02 DIAGNOSIS — Z8349 Family history of other endocrine, nutritional and metabolic diseases: Secondary | ICD-10-CM | POA: Insufficient documentation

## 2020-12-02 DIAGNOSIS — N202 Calculus of kidney with calculus of ureter: Secondary | ICD-10-CM | POA: Insufficient documentation

## 2020-12-02 DIAGNOSIS — Z9049 Acquired absence of other specified parts of digestive tract: Secondary | ICD-10-CM | POA: Diagnosis not present

## 2020-12-02 DIAGNOSIS — Z887 Allergy status to serum and vaccine status: Secondary | ICD-10-CM | POA: Diagnosis not present

## 2020-12-02 DIAGNOSIS — Z79899 Other long term (current) drug therapy: Secondary | ICD-10-CM | POA: Diagnosis not present

## 2020-12-02 DIAGNOSIS — Z8619 Personal history of other infectious and parasitic diseases: Secondary | ICD-10-CM | POA: Insufficient documentation

## 2020-12-02 DIAGNOSIS — E785 Hyperlipidemia, unspecified: Secondary | ICD-10-CM | POA: Insufficient documentation

## 2020-12-02 DIAGNOSIS — N200 Calculus of kidney: Secondary | ICD-10-CM

## 2020-12-02 HISTORY — PX: CYSTOSCOPY/URETEROSCOPY/HOLMIUM LASER/STENT PLACEMENT: SHX6546

## 2020-12-02 LAB — POCT PREGNANCY, URINE: Preg Test, Ur: NEGATIVE

## 2020-12-02 SURGERY — CYSTOSCOPY/URETEROSCOPY/HOLMIUM LASER/STENT PLACEMENT
Anesthesia: General | Laterality: Left

## 2020-12-02 MED ORDER — FENTANYL CITRATE (PF) 100 MCG/2ML IJ SOLN
INTRAMUSCULAR | Status: DC | PRN
  Administered 2020-12-02 (×2): 50 ug via INTRAVENOUS

## 2020-12-02 MED ORDER — FAMOTIDINE 20 MG PO TABS
20.0000 mg | ORAL_TABLET | Freq: Once | ORAL | Status: AC
  Administered 2020-12-02: 20 mg via ORAL

## 2020-12-02 MED ORDER — LIDOCAINE HCL (PF) 2 % IJ SOLN
INTRAMUSCULAR | Status: AC
  Filled 2020-12-02: qty 10

## 2020-12-02 MED ORDER — SUCCINYLCHOLINE CHLORIDE 200 MG/10ML IV SOSY
PREFILLED_SYRINGE | INTRAVENOUS | Status: AC
  Filled 2020-12-02: qty 10

## 2020-12-02 MED ORDER — LACTATED RINGERS IV SOLN: INTRAVENOUS | Status: DC

## 2020-12-02 MED ORDER — SUGAMMADEX SODIUM 200 MG/2ML IV SOLN
INTRAVENOUS | Status: DC | PRN
  Administered 2020-12-02: 200 mg via INTRAVENOUS

## 2020-12-02 MED ORDER — IOHEXOL 180 MG/ML  SOLN
INTRAMUSCULAR | Status: DC | PRN
  Administered 2020-12-02: 5 mL

## 2020-12-02 MED ORDER — ONDANSETRON HCL 4 MG/2ML IJ SOLN
INTRAMUSCULAR | Status: DC | PRN
  Administered 2020-12-02: 4 mg via INTRAVENOUS

## 2020-12-02 MED ORDER — ACETAMINOPHEN 10 MG/ML IV SOLN
INTRAVENOUS | Status: AC
  Filled 2020-12-02: qty 100

## 2020-12-02 MED ORDER — OXYCODONE HCL 5 MG PO TABS
ORAL_TABLET | ORAL | Status: AC
  Filled 2020-12-02: qty 1

## 2020-12-02 MED ORDER — MIDAZOLAM HCL 2 MG/2ML IJ SOLN
INTRAMUSCULAR | Status: AC
  Filled 2020-12-02: qty 2

## 2020-12-02 MED ORDER — MIDAZOLAM HCL 2 MG/2ML IJ SOLN
INTRAMUSCULAR | Status: DC | PRN
  Administered 2020-12-02: 2 mg via INTRAVENOUS

## 2020-12-02 MED ORDER — PROPOFOL 10 MG/ML IV BOLUS
INTRAVENOUS | Status: DC | PRN
  Administered 2020-12-02: 150 mg via INTRAVENOUS

## 2020-12-02 MED ORDER — OXYCODONE HCL 5 MG PO TABS
5.0000 mg | ORAL_TABLET | ORAL | Status: AC
Start: 2020-12-02 — End: 2020-12-02
  Administered 2020-12-02: 5 mg via ORAL

## 2020-12-02 MED ORDER — CEFAZOLIN SODIUM-DEXTROSE 2-4 GM/100ML-% IV SOLN
INTRAVENOUS | Status: AC
  Filled 2020-12-02: qty 100

## 2020-12-02 MED ORDER — ROCURONIUM BROMIDE 10 MG/ML (PF) SYRINGE
PREFILLED_SYRINGE | INTRAVENOUS | Status: AC
  Filled 2020-12-02: qty 10

## 2020-12-02 MED ORDER — DEXAMETHASONE SODIUM PHOSPHATE 10 MG/ML IJ SOLN
INTRAMUSCULAR | Status: DC | PRN
  Administered 2020-12-02: 5 mg via INTRAVENOUS

## 2020-12-02 MED ORDER — ONDANSETRON HCL 4 MG/2ML IJ SOLN: 4.0000 mg | Freq: Once | INTRAMUSCULAR | Status: AC | PRN

## 2020-12-02 MED ORDER — HYDROMORPHONE HCL 1 MG/ML IJ SOLN
0.2500 mg | INTRAMUSCULAR | Status: DC | PRN
  Administered 2020-12-02: 0.25 mg via INTRAVENOUS

## 2020-12-02 MED ORDER — ORAL CARE MOUTH RINSE: 15.0000 mL | Freq: Once | OROMUCOSAL | Status: AC

## 2020-12-02 MED ORDER — FENTANYL CITRATE (PF) 100 MCG/2ML IJ SOLN
INTRAMUSCULAR | Status: AC
  Filled 2020-12-02: qty 2

## 2020-12-02 MED ORDER — DEXAMETHASONE SODIUM PHOSPHATE 10 MG/ML IJ SOLN
INTRAMUSCULAR | Status: AC
  Filled 2020-12-02: qty 1

## 2020-12-02 MED ORDER — FENTANYL CITRATE (PF) 100 MCG/2ML IJ SOLN
INTRAMUSCULAR | Status: AC
  Administered 2020-12-02: 25 ug via INTRAVENOUS
  Filled 2020-12-02: qty 2

## 2020-12-02 MED ORDER — ONDANSETRON HCL 4 MG/2ML IJ SOLN
INTRAMUSCULAR | Status: AC
  Administered 2020-12-02: 4 mg via INTRAVENOUS
  Filled 2020-12-02: qty 2

## 2020-12-02 MED ORDER — OXYCODONE HCL 5 MG PO TABS: 5.0000 mg | ORAL_TABLET | Freq: Four times a day (QID) | ORAL | 0 refills | Status: DC | PRN

## 2020-12-02 MED ORDER — LIDOCAINE HCL (CARDIAC) PF 100 MG/5ML IV SOSY
PREFILLED_SYRINGE | INTRAVENOUS | Status: DC | PRN
  Administered 2020-12-02: 80 mg via INTRAVENOUS

## 2020-12-02 MED ORDER — CEFAZOLIN SODIUM-DEXTROSE 2-4 GM/100ML-% IV SOLN
2.0000 g | INTRAVENOUS | Status: AC
  Administered 2020-12-02: 2 g via INTRAVENOUS

## 2020-12-02 MED ORDER — HYDROMORPHONE HCL 1 MG/ML IJ SOLN
INTRAMUSCULAR | Status: AC
  Filled 2020-12-02: qty 1

## 2020-12-02 MED ORDER — FENTANYL CITRATE (PF) 100 MCG/2ML IJ SOLN
25.0000 ug | INTRAMUSCULAR | Status: AC | PRN
  Administered 2020-12-02 (×6): 25 ug via INTRAVENOUS

## 2020-12-02 MED ORDER — CHLORHEXIDINE GLUCONATE 0.12 % MT SOLN
15.0000 mL | Freq: Once | OROMUCOSAL | Status: AC
  Administered 2020-12-02: 15 mL via OROMUCOSAL

## 2020-12-02 MED ORDER — ACETAMINOPHEN 10 MG/ML IV SOLN
INTRAVENOUS | Status: DC | PRN
  Administered 2020-12-02: 1000 mg via INTRAVENOUS

## 2020-12-02 MED ORDER — CHLORHEXIDINE GLUCONATE 0.12 % MT SOLN
OROMUCOSAL | Status: AC
  Filled 2020-12-02: qty 15

## 2020-12-02 MED ORDER — FAMOTIDINE 20 MG PO TABS
ORAL_TABLET | ORAL | Status: AC
  Filled 2020-12-02: qty 1

## 2020-12-02 MED ORDER — ROCURONIUM BROMIDE 100 MG/10ML IV SOLN
INTRAVENOUS | Status: DC | PRN
  Administered 2020-12-02: 40 mg via INTRAVENOUS
  Administered 2020-12-02: 10 mg via INTRAVENOUS

## 2020-12-02 MED ORDER — ONDANSETRON HCL 4 MG/2ML IJ SOLN
INTRAMUSCULAR | Status: AC
  Filled 2020-12-02: qty 2

## 2020-12-02 MED ORDER — PROPOFOL 10 MG/ML IV BOLUS
INTRAVENOUS | Status: AC
  Filled 2020-12-02: qty 20

## 2020-12-02 SURGICAL SUPPLY — 29 items
BAG DRAIN CYSTO-URO LG1000N (MISCELLANEOUS) ×2 IMPLANT
BASKET ZERO TIP 1.9FR (BASKET) ×2 IMPLANT
BRUSH SCRUB EZ 1% IODOPHOR (MISCELLANEOUS) ×2 IMPLANT
BSKT STON RTRVL ZERO TP 1.9FR (BASKET) ×1
CATH URET FLEX-TIP 2 LUMEN 10F (CATHETERS) IMPLANT
CATH URETL 5X70 OPEN END (CATHETERS) IMPLANT
CNTNR SPEC 2.5X3XGRAD LEK (MISCELLANEOUS)
CONT SPEC 4OZ STER OR WHT (MISCELLANEOUS)
CONT SPEC 4OZ STRL OR WHT (MISCELLANEOUS)
CONTAINER SPEC 2.5X3XGRAD LEK (MISCELLANEOUS) IMPLANT
DRAPE UTILITY 15X26 TOWEL STRL (DRAPES) ×2 IMPLANT
GLOVE SURG UNDER POLY LF SZ7.5 (GLOVE) ×2 IMPLANT
GOWN STRL REUS W/ TWL LRG LVL3 (GOWN DISPOSABLE) ×1 IMPLANT
GOWN STRL REUS W/ TWL XL LVL3 (GOWN DISPOSABLE) ×1 IMPLANT
GOWN STRL REUS W/TWL LRG LVL3 (GOWN DISPOSABLE) ×2
GOWN STRL REUS W/TWL XL LVL3 (GOWN DISPOSABLE) ×2
GUIDEWIRE STR DUAL SENSOR (WIRE) ×2 IMPLANT
INFUSOR MANOMETER BAG 3000ML (MISCELLANEOUS) ×2 IMPLANT
IV NS IRRIG 3000ML ARTHROMATIC (IV SOLUTION) ×2 IMPLANT
KIT TURNOVER CYSTO (KITS) ×2 IMPLANT
PACK CYSTO AR (MISCELLANEOUS) ×2 IMPLANT
SET CYSTO W/LG BORE CLAMP LF (SET/KITS/TRAYS/PACK) ×2 IMPLANT
SHEATH URETERAL 12FRX35CM (MISCELLANEOUS) IMPLANT
STENT URET 6FRX24 CONTOUR (STENTS) ×2 IMPLANT
STENT URET 6FRX26 CONTOUR (STENTS) IMPLANT
SURGILUBE 2OZ TUBE FLIPTOP (MISCELLANEOUS) ×2 IMPLANT
TRACTIP FLEXIVA PULSE ID 200 (Laser) ×2 IMPLANT
VALVE UROSEAL ADJ ENDO (VALVE) ×2 IMPLANT
WATER STERILE IRR 1000ML POUR (IV SOLUTION) ×2 IMPLANT

## 2020-12-02 NOTE — Transfer of Care (Signed)
Immediate Anesthesia Transfer of Care Note  Patient: Shannon Davidson  Procedure(s) Performed: CYSTOSCOPY/URETEROSCOPY/HOLMIUM LASER/STENT EXCHANGE (Left )  Patient Location: PACU  Anesthesia Type:General  Level of Consciousness: drowsy  Airway & Oxygen Therapy: Patient Spontanous Breathing and Patient connected to face mask oxygen  Post-op Assessment: Report given to RN and Post -op Vital signs reviewed and stable  Post vital signs: Reviewed and stable  Last Vitals:  Vitals Value Taken Time  BP 134/82 12/02/20 1354  Temp 36.6 C 12/02/20 1354  Pulse 95 12/02/20 1356  Resp 14 12/02/20 1356  SpO2 97 % 12/02/20 1356  Vitals shown include unvalidated device data.  Last Pain:  Vitals:   12/02/20 1035  TempSrc: Tympanic  PainSc: 6          Complications: No complications documented.

## 2020-12-02 NOTE — Anesthesia Procedure Notes (Signed)
Procedure Name: Intubation Date/Time: 12/02/2020 11:58 AM Performed by: Chanetta Marshall, CRNA Pre-anesthesia Checklist: Patient identified, Emergency Drugs available, Suction available and Patient being monitored Patient Re-evaluated:Patient Re-evaluated prior to induction Oxygen Delivery Method: Circle system utilized Preoxygenation: Pre-oxygenation with 100% oxygen Induction Type: IV induction Ventilation: Mask ventilation without difficulty Laryngoscope Size: McGraph and 3 Grade View: Grade I Tube type: Oral Tube size: 7.0 mm Number of attempts: 1 Airway Equipment and Method: Video-laryngoscopy Placement Confirmation: ETT inserted through vocal cords under direct vision,  positive ETCO2,  breath sounds checked- equal and bilateral and CO2 detector Secured at: 21 cm Tube secured with: Tape Dental Injury: Teeth and Oropharynx as per pre-operative assessment

## 2020-12-02 NOTE — Anesthesia Preprocedure Evaluation (Signed)
Anesthesia Evaluation  Patient identified by MRN, date of birth, ID band Patient awake    Reviewed: Allergy & Precautions, NPO status , Patient's Chart, lab work & pertinent test results  Airway Mallampati: II  TM Distance: >3 FB Neck ROM: Full    Dental no notable dental hx.    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 Bernardo Heater No date: Lyme disease No date: Palpitations 2012: Pap smear for cervical cancer screening     Comment:  Dr. Benjie Karvonen No date: PCOS (polycystic ovarian syndrome) 11/25/2014: Relapsing remitting multiple sclerosis ([place])     Comment:  Patient says that all her MS symptoms went away in               2017-2018 after she was treated for tick-bourne illnesses  Reproductive/Obstetrics negative OB ROS                             Anesthesia Physical  Anesthesia Plan  ASA: II  Anesthesia Plan: General   Post-op Pain Management:    Induction: Intravenous  PONV Risk Score and Plan:   Airway Management Planned: Oral ETT  Additional Equipment:   Intra-op Plan:   Post-operative Plan:   Informed Consent: I have reviewed the patients History and Physical, chart, labs and discussed the procedure including the risks, benefits and alternatives for the proposed anesthesia  with the patient or authorized representative who has indicated his/her understanding and acceptance.       Plan Discussed with: CRNA  Anesthesia Plan Comments:         Anesthesia Quick Evaluation

## 2020-12-02 NOTE — Anesthesia Postprocedure Evaluation (Signed)
Anesthesia Post Note  Patient: Shannon Davidson  Procedure(s) Performed: CYSTOSCOPY/URETEROSCOPY/HOLMIUM LASER/STENT EXCHANGE (Left )  Patient location during evaluation: PACU Anesthesia Type: General Level of consciousness: awake and alert, awake and oriented Pain management: pain level controlled Vital Signs Assessment: post-procedure vital signs reviewed and stable Respiratory status: spontaneous breathing, nonlabored ventilation and respiratory function stable Cardiovascular status: blood pressure returned to baseline and stable Postop Assessment: no apparent nausea or vomiting Anesthetic complications: no   No complications documented.   Last Vitals:  Vitals:   12/02/20 1354 12/02/20 1415  BP: 134/82 134/88  Pulse: 96 87  Resp: 16 18  Temp: 36.6 C   SpO2: 99% 97%    Last Pain:  Vitals:   12/02/20 1420  TempSrc:   PainSc: 5                  Phill Mutter

## 2020-12-02 NOTE — Interval H&P Note (Signed)
History and Physical Interval Note:  She does desire to attempt to clear the left renal calculi.  CV: RRR Lungs: Clear  12/02/2020 11:26 AM  Shannon Davidson  has presented today for surgery, with the diagnosis of left Ureteral Calculus.  The various methods of treatment have been discussed with the patient and family. After consideration of risks, benefits and other options for treatment, the patient has consented to  Procedure(s): CYSTOSCOPY/URETEROSCOPY/HOLMIUM LASER/STENT EXCHANGE (Left) as a surgical intervention.  The patient's history has been reviewed, patient examined, no change in status, stable for surgery.  I have reviewed the patient's chart and labs.  Questions were answered to the patient's satisfaction.     [place]

## 2020-12-02 NOTE — Op Note (Signed)
Preoperative diagnosis:  Left ureteral calculus Left nephrolithiasis  Postoperative diagnosis:  Same  Procedure:  Cystoscopy Left ureteroscopy and stone removal Ureteroscopic laser lithotripsy Left ureteral stent exchange (6FR/24 cm) Left retrograde pyelography with interpretation  Surgeon: Nicki Reaper C. Latasha Buczkowski, M.D.  Anesthesia: General  Complications: None  Intraoperative findings:  Cystoscopy-mild inflammatory changes left UO/hemitrigone secondary to indwelling stent.  No other mucosal abnormalities noted Left ureteropyeloscopy-5 mm fragment left mid ureter; 8 mm left proximal ureteral calculus; 10 mm and 7 mm left lower calyceal calculi; 4 mm mid calyceal calculus Left retrograde pyelography post procedure showed no filling defects, stone fragments or contrast extravasation   EBL: Minimal  Specimens: Calculus fragments for analysis   Indication: Shannon Davidson is a 40 y.o. female with a history of an obstructing left proximal ureteral calculus with infection who underwent placement of a ureteral stent at [facility] 11/21/2020.  Definitive [facility] June and she requested to have her stone removed sooner.  After reviewing the management options for treatment, the patient elected to proceed with the above surgical procedure(s). We have discussed the potential benefits and risks of the procedure, side effects of the proposed treatment, the likelihood of the patient achieving the goals of the procedure, and any potential problems that might occur during the procedure or recuperation. Informed consent has been obtained.  Description of procedure:  The patient was taken to the operating room and general anesthesia was induced.  The patient was placed in the dorsal lithotomy position, prepped and draped in the usual sterile fashion, and preoperative antibiotics were administered. A preoperative time-out was performed.   A 21 French cystoscope  sheath with obturator was lubricated and passed per urethra.  Panendoscopy was then performed with findings as described above.  The ureteral stent was grasped with endoscopic forceps and brought out to the urethral meatus.  A 0.038 Sensor wire was then placed through the stent and advanced into the renal pelvis on fluoroscopic guidance.  The stent was removed and a 4.5 French semirigid ureteroscope was passed per urethra.  The left ureteral orifice was easily engaged and advanced proximally.  A stone fragment was identified in the left mid ureter and was fragmented with a 242 m laser fiber at a setting of 2 J / 10 Hz.  The ureteroscope was advanced proximally and the main calculus was identified in the proximal ureter.  The semirigid scope was removed and a single channel digital flexible ureteroscope was passed per urethra and advanced into the ureter alongside the guidewire without difficulty.  The ureter was dilated and the flexible scope easily advanced to the level of the stone.  The calculus was dusted and at a setting of 0.2 J / 20 Hz.  Once this calculus was treated the ureteroscope was advanced into the renal pelvis.  The 10 mm lower calyceal calculus was identified and dusted at a setting of 0.2 J / 40 Hz.  Once completed there were some larger fragments measuring ~ 2 mm and settled in a lower pole calyx which were treated with noncontact laser lithotripsy at 0.6 J / 40 Hz until there were no fragments larger than 1 mm.  There were 3 fragments within the lower pole calyx which were removed with a 1.9 [place] nitinol basket.  The 7 mm lower calyceal calculus was placed in a basket and was able to be brought down to the mid ureter where it was released and it was dusted in  the ureter at a setting of 0.2 J / 20 Hz.  The ureteroscope was repassed and the mid calyceal calculus was placed in a 1.9 French nitinol basket and removed without difficulty.  Retrograde pyelogram was performed to the ureteroscope  with findings as described above.  All calyces were examined and no fragments >1 mm were identified.  The ureteroscope was withdrawn under direct vision and no ureteral fragments were identified.  A 6 FR/24 CM Contour ureteral stent was placed under fluoroscopic guidance.  The wire was then removed with an adequate stent curl noted in the renal pelvis as well as in the bladder.  The bladder was then emptied and the procedure ended.  The patient appeared to tolerate the procedure well and without complications.  After anesthetic reversal the patient was transported to the PACU in stable condition.   Plan: The stent was left attached to a tether and the patient was instructed to remove on 12/05/2020. Postop follow-up to be scheduled 1 month   John Giovanni, MD

## 2020-12-02 NOTE — Discharge Instructions (Signed)
DISCHARGE INSTRUCTIONS FOR KIDNEY STONE/URETERAL STENT   MEDICATIONS:  1. Resume all your other meds from home.  2.  AZO (over-the-counter) can help with the burning/stinging when you urinate. 3.  Continue oxycodone, oxybutynin and tamsulosin as needed  ACTIVITY:  1. May resume regular activities in 24 hours. 2. No driving while on narcotic pain medications  3. Drink plenty of water  4. Continue to walk at home - you can still get blood clots when you are at home, so keep active, but don't over do it.  5. May return to work/school tomorrow or when you feel ready   BATHING:  1. You can shower. 2. You have a string coming from your urethra: The stent string is attached to your ureteral stent. Do not pull on this.   SIGNS/SYMPTOMS TO CALL:  Common postoperative symptoms include urinary frequency, urgency, bladder spasm and blood in the urine  Please call us if you have a fever greater than 101.5, uncontrolled nausea/vomiting, uncontrolled pain, dizziness, unable to urinate, excessively bloody urine, chest pain, shortness of breath, leg swelling, leg pain, or any other concerns or questions.   You can reach [place] at [phone].   FOLLOW-UP:  1. You we will be contacted for a postop follow-up appointment in approximately 1 month 2. You have a string attached to your stent, you may remove it on Friday, 12/05/2020. To do this, pull the string until the stent is completely removed. You may feel an odd sensation in your back.  AMBULATORY SURGERY  DISCHARGE INSTRUCTIONS   1) The drugs that you were given will stay in your system until tomorrow so for the next 24 hours you should not:  A) Drive an automobile B) Make any legal decisions C) Drink any alcoholic beverage   2) You may resume regular meals tomorrow.  Today it is better to start with liquids and gradually work up to solid foods.  You may eat anything you prefer, but it is better to start with liquids, then soup and crackers,  and gradually work up to solid foods.   3) Please notify your doctor immediately if you have any unusual bleeding, trouble breathing, redness and pain at the surgery site, drainage, fever, or pain not relieved by medication.    4) Additional Instructions:    Please contact your physician with any problems or Same Day Surgery at [phone], Monday through Friday 6 am to 4 pm, or [place] at [facility] number at [phone].

## 2020-12-03 ENCOUNTER — Other Ambulatory Visit: Payer: Self-pay | Admitting: *Deleted

## 2020-12-03 ENCOUNTER — Encounter: Payer: Self-pay | Admitting: Urology

## 2020-12-03 NOTE — Patient Outreach (Signed)
[facility]) Care Management  12/03/2020  Shannon Davidson March 19, 1981 [id number]   Transition of care call Referral received: 11/24/20 Initial outreach attempt: /11/24/20 Insurance: [place]  #3 Outreach attempt  Third unsuccessful telephone call to patient's preferred contact number in order to complete post hospital discharge transition of care assessment; no answer, left HIPAA compliant message requesting return call.   Objective: Per electronic medical record, Shannon Davidson hospitalized [facility] 8/27-0/78/67 for Renal colic left side, UTI with hematuria, Left cystourethroscopy, left ureteral stent, 30mm stone Comorbidities include: Hyperlipidemia, polycystic ovarian syndrome 5/24 at [facility] patient with cystoscopy/ ureteroscopy stone removal /stent exchange    Plan: If no return call from patient, will plan return call in the next 3 weeks.    Joylene Draft, RN, BSN  [place] Management Coordinator  [phone]- Mobile [phone]- Toll Free Main Office

## 2020-12-10 ENCOUNTER — Ambulatory Visit: Payer: 59 | Admitting: Urology

## 2020-12-24 ENCOUNTER — Other Ambulatory Visit: Payer: Self-pay | Admitting: *Deleted

## 2020-12-24 ENCOUNTER — Encounter: Payer: 59 | Admitting: Nurse Practitioner

## 2020-12-24 NOTE — Patient Outreach (Signed)
[facility]) Care Management  12/24/2020  Shannon Davidson 01-Dec-1980 [id number]   Transition of care /Case Closure Unsuccessful outreach    Referral received:11/24/20 Initial outreach:11/24/20 Insurance: [place]   Unable to complete post hospital discharge transition of care assessment. No return call form patient after 3 call attempts and no response to request to contact RN Care Coordinator in unsuccessful outreach letter mailed to home on 11/24/20.  Objective: Per electronic medical record, Shannon Davidson  was hospitalized at [facility] 8/81-1/03/15 for Renal colic left side, UTI with hematuria, Left cystourethroscopy, left ureteral stent , 62mm stone  Comorbidities include: Hyperlipidemia, polycystic ovarian syndrome 5/24 at [facility] patient with cystoscopy/ ureteroscopy stone removal /stent exchange same day procedure.     Plan Case closed to [place] care management services unsuccessful outreach attempt x4.   Joylene Draft, RN, BSN  [place] Management Coordinator  [phone]- Mobile [phone]- Toll Free Main Office

## 2021-01-02 ENCOUNTER — Encounter: Payer: Self-pay | Admitting: Urology

## 2021-01-02 ENCOUNTER — Other Ambulatory Visit: Payer: Self-pay

## 2021-01-02 ENCOUNTER — Ambulatory Visit (INDEPENDENT_AMBULATORY_CARE_PROVIDER_SITE_OTHER): Payer: 59 | Admitting: Urology

## 2021-01-02 VITALS — BP 118/77 | HR 80 | Ht 65.0 in | Wt 180.0 lb

## 2021-01-02 DIAGNOSIS — N200 Calculus of kidney: Secondary | ICD-10-CM | POA: Diagnosis not present

## 2021-01-02 NOTE — Patient Instructions (Signed)

## 2021-01-02 NOTE — Progress Notes (Signed)
01/02/2021 8:40 AM   Shannon Davidson [birth year] [id number]  Referring provider: Jon Billings, NP [address] [place],  [place] [address]  Chief Complaint  Patient presents with   Nephrolithiasis    HPI: 40 y.o. female presents for postop follow-up.  Ureteroscopic removal left ureteral and renal calculi 12/02/2020 She removed stent POD #3 No postoperative problems and no problems after stent removal No complaints today No stone analysis report in Epic  PMH: Past Medical History:  Diagnosis Date   Babesiasis    Bartonella infection    Elevated EBV antibody titer    History of kidney stones    HLD (hyperlipidemia)    Kidney stone    Dr. Bernardo Heater   Lyme disease    Palpitations    Pap smear for cervical cancer screening 2012   Dr. Benjie Karvonen   PCOS (polycystic ovarian syndrome)    Relapsing remitting multiple sclerosis ([place]) 11/25/2014   Patient says that all her MS symptoms went away in 2017-2018 after she was treated for tick-bourne illnesses    Surgical History: Past Surgical History:  Procedure Laterality Date   APPENDECTOMY  09/2019   CESAREAN SECTION  2008   CYSTOSCOPY/URETEROSCOPY/HOLMIUM LASER/STENT PLACEMENT Left 12/02/2020   Procedure: CYSTOSCOPY/URETEROSCOPY/HOLMIUM LASER/STENT EXCHANGE;  Surgeon: Abbie Sons, MD;  Location: [facility] ORS;  Service: Urology;  Laterality: Left;   LITHOTRIPSY     TONSILLECTOMY N/A 05/20/2016   Procedure: TONSILLECTOMY;  Surgeon: Margaretha Sheffield, MD;  Location: [place];  Service: ENT;  Laterality: N/A;   TONSILLECTOMY      Home Medications:  Allergies as of 01/02/2021       Reactions   Albumen, Egg Swelling   Influenza Vaccines Swelling        Medication List        Accurate as of January 02, 2021  8:40 AM. If you have any questions, ask your nurse or doctor.          acetaminophen 500 MG tablet Commonly known as: TYLENOL Take 1,000 mg by mouth every 6 (six) hours as needed for mild pain or moderate  pain.   cefdinir 300 MG capsule Commonly known as: OMNICEF Take 300 mg by mouth 2 (two) times daily.   Fish Oil 1000 MG Cpdr Take 1,000 mg by mouth in the morning and at bedtime.   MULTIVITAMIN ADULT PO Take 1 tablet by mouth daily.   oxyCODONE 5 MG immediate release tablet Commonly known as: Oxy IR/ROXICODONE Take 1 tablet (5 mg total) by mouth every 6 (six) hours as needed for severe pain or moderate pain.   Vitamin D3 125 MCG (5000 UT) Caps Take 10,000 Units by mouth daily.        Allergies:  Allergies  Allergen Reactions   Albumen, Egg Swelling   Influenza Vaccines Swelling    Family History: Family History  Problem Relation Age of Onset   Heart disease Maternal Grandmother 25       s/p CABG   Dementia Maternal Grandmother    Hypertension Mother    Seizures Father    Hyperlipidemia Father    Achalasia Sister    COPD Maternal Grandfather    Diabetes Paternal Grandmother     Social History:  reports that she has never smoked. She has never used smokeless tobacco. She reports current alcohol use. She reports that she does not use drugs.   Physical Exam: BP 118/77   Pulse 80   Ht 5\' 5"  (1.651 m)   Wt  180 lb (81.6 kg)   BMI 29.95 kg/m   Constitutional:  Alert and oriented, No acute distress.   Assessment & Plan:    1.  Recurrent nephrolithiasis Doing well status post ureteroscopic removal of multiple renal calculi and ureteral calculus Recommend metabolic evaluation to include 24-hour urine study and blood work through [employer] with results Follow-up 6 months with [place], MD  [place] [address], [place], [place] [address] [phone]

## 2021-01-04 NOTE — Progress Notes (Addendum)
BP 120/65   Pulse 85   Temp 98.4 F (36.9 C) (Oral)   Ht 5\' 5"  (1.651 m)   Wt 191 lb (86.6 kg)   LMP 12/15/2020 (Approximate)   SpO2 97%   BMI 31.78 kg/m    Subjective:    Patient ID: Shannon Davidson, female    DOB: 19-Oct-1980, 40 y.o.   MRN: [id number]  HPI: Shannon Davidson is a 40 y.o. female presenting on 01/05/2021 for comprehensive medical examination. Current medical complaints include: has seen urology for kidney's.   She currently lives with: Menopausal Symptoms: no  Patient states she may have a UTI. Not sure if she is experiencing UTI symptoms but she might be.  Patient recently treated for Kidney Stones that caused several UTIs.  Denies fever, back pain, or urgency.   Denies HA, CP, SOB, dizziness, palpitations, visual changes, and lower extremity swelling.  Depression Screen done today and results listed below:  Depression screen [facility] 2/9 01/05/2021 11/19/2020 06/18/2015 06/02/2012  Decreased Interest 0 0 0 0  Down, Depressed, Hopeless 0 0 0 0  PHQ - 2 Score 0 0 0 0    The patient does not have a history of falls. I did complete a risk assessment for falls. A plan of care for falls was documented.   Past Medical History:  Past Medical History:  Diagnosis Date   Babesiasis    Bartonella infection    Elevated EBV antibody titer    History of kidney stones    HLD (hyperlipidemia)    Kidney stone    Dr. Bernardo Heater   Lyme disease    Palpitations    Pap smear for cervical cancer screening 2012   Dr. Benjie Karvonen   PCOS (polycystic ovarian syndrome)    Relapsing remitting multiple sclerosis ([place]) 11/25/2014   Patient says that all her MS symptoms went away in 2017-2018 after she was treated for tick-bourne illnesses    Surgical History:  Past Surgical History:  Procedure Laterality Date   APPENDECTOMY  09/2019   CESAREAN SECTION  2008   CYSTOSCOPY/URETEROSCOPY/HOLMIUM LASER/STENT PLACEMENT Left 12/02/2020   Procedure: CYSTOSCOPY/URETEROSCOPY/HOLMIUM LASER/STENT  EXCHANGE;  Surgeon: Abbie Sons, MD;  Location: [facility];  Service: Urology;  Laterality: Left;   LITHOTRIPSY     TONSILLECTOMY N/A 05/20/2016   Procedure: TONSILLECTOMY;  Surgeon: Margaretha Sheffield, MD;  Location: [facility];  Service: ENT;  Laterality: N/A;   TONSILLECTOMY      Medications:  Current Outpatient Medications on File Prior to Visit  Medication Sig   Cholecalciferol (VITAMIN D3) 5000 UNITS CAPS Take 10,000 Units by mouth daily.    Multiple Vitamin (MULTIVITAMIN ADULT PO) Take 1 tablet by mouth daily.   Omega-3 Fatty Acids (FISH OIL) 1000 MG CPDR Take 1,000 mg by mouth in the morning and at bedtime.   No current facility-administered medications on file prior to visit.    Allergies:  Allergies  Allergen Reactions   Albumen, Egg Swelling   Influenza Vaccines Swelling    Social History:  Social History   Socioeconomic History   Marital status: Single    Spouse name: Not on file   Number of children: 1   Years of education: Not on file   Highest education level: Not on file  Occupational History   Occupation: Games developer GI    Employer: [employer]  Tobacco Use   Smoking status: Never   Smokeless tobacco: Never   Tobacco comments:    tobacco use - no  Vaping Use   Vaping Use: Never used  Substance and Sexual Activity   Alcohol use: Yes    Comment: social - maybe 3 glasses of wine/month    Drug use: No   Sexual activity: Yes    Birth control/protection: None, Condom  Other Topics Concern   Not on file  Social History Narrative   Has one child.      Single, full time RN.    Social Determinants of Health   Financial Resource Strain: Not on file  Food Insecurity: Not on file  Transportation Needs: Not on file  Physical Activity: Not on file  Stress: Not on file  Social Connections: Not on file  Intimate Partner Violence: Not on file   Social History   Tobacco Use  Smoking Status Never  Smokeless Tobacco Never  Tobacco Comments    tobacco use - no   Social History   Substance and Sexual Activity  Alcohol Use Yes   Comment: social - maybe 3 glasses of wine/month     Family History:  Family History  Problem Relation Age of Onset   Heart disease Maternal Grandmother 72       s/p CABG   Dementia Maternal Grandmother    Hypertension Mother    Seizures Father    Hyperlipidemia Father    Achalasia Sister    COPD Maternal Grandfather    Diabetes Paternal Grandmother     Past medical history, surgical history, medications, allergies, family history and social history reviewed with patient today and changes made to appropriate areas of the chart.   Review of Systems  Constitutional:  Negative for fever.  Eyes:  Negative for blurred vision and double vision.  Respiratory:  Negative for shortness of breath.   Cardiovascular:  Negative for chest pain, palpitations and leg swelling.  Genitourinary:  Positive for dysuria. Negative for flank pain and urgency.  Neurological:  Negative for dizziness and headaches.  All other ROS negative except what is listed above and in the HPI.      Objective:    BP 120/65   Pulse 85   Temp 98.4 F (36.9 C) (Oral)   Ht 5\' 5"  (1.651 m)   Wt 191 lb (86.6 kg)   LMP 12/15/2020 (Approximate)   SpO2 97%   BMI 31.78 kg/m   Wt Readings from Last 3 Encounters:  01/05/21 191 lb (86.6 kg)  01/02/21 180 lb (81.6 kg)  12/02/20 185 lb (83.9 kg)    Physical Exam Vitals and nursing note reviewed. Exam conducted with a chaperone present Yvonna Alanis, [place]).  Constitutional:      General: She is awake. She is not in acute distress.    Appearance: She is well-developed. She is not ill-appearing.  HENT:     Head: Normocephalic and atraumatic.     Right Ear: Hearing, tympanic membrane, ear canal and external ear normal. No drainage.     Left Ear: Hearing, tympanic membrane, ear canal and external ear normal. No drainage.     Nose: Nose normal.     Right Sinus: No maxillary sinus  tenderness or frontal sinus tenderness.     Left Sinus: No maxillary sinus tenderness or frontal sinus tenderness.     Mouth/Throat:     Mouth: Mucous membranes are moist.     Pharynx: Oropharynx is clear. Uvula midline. No pharyngeal swelling, oropharyngeal exudate or posterior oropharyngeal erythema.  Eyes:     General: Lids are normal.  Right eye: No discharge.        Left eye: No discharge.     Extraocular Movements: Extraocular movements intact.     Conjunctiva/sclera: Conjunctivae normal.     Pupils: Pupils are equal, round, and reactive to light.     Visual Fields: Right eye visual fields normal and left eye visual fields normal.  Neck:     Thyroid: No thyromegaly.     Vascular: No carotid bruit.     Trachea: Trachea normal.  Cardiovascular:     Rate and Rhythm: Normal rate and regular rhythm.     Heart sounds: Normal heart sounds. No murmur heard.   No gallop.  Pulmonary:     Effort: Pulmonary effort is normal. No accessory muscle usage or respiratory distress.     Breath sounds: Normal breath sounds.  Chest:  Breasts:    Right: Normal. No axillary adenopathy or supraclavicular adenopathy.     Left: Normal. No axillary adenopathy or supraclavicular adenopathy.  Abdominal:     General: Bowel sounds are normal.     Palpations: Abdomen is soft. There is no hepatomegaly or splenomegaly.     Tenderness: There is no abdominal tenderness.  Genitourinary:    General: Normal vulva.     Vagina: Normal.     Cervix: Normal.     Adnexa: Right adnexa normal and left adnexa normal.  Musculoskeletal:        General: Normal range of motion.     Cervical back: Normal range of motion and neck supple.     Right lower leg: No edema.     Left lower leg: No edema.  Lymphadenopathy:     Head:     Right side of head: No submental, submandibular, tonsillar, preauricular or posterior auricular adenopathy.     Left side of head: No submental, submandibular, tonsillar, preauricular or  posterior auricular adenopathy.     Cervical: No cervical adenopathy.     Upper Body:     Right upper body: No supraclavicular, axillary or pectoral adenopathy.     Left upper body: No supraclavicular, axillary or pectoral adenopathy.  Skin:    General: Skin is warm and dry.     Capillary Refill: Capillary refill takes less than 2 seconds.     Findings: No rash.  Neurological:     Mental Status: She is alert and oriented to person, place, and time.     Cranial Nerves: Cranial nerves are intact.     Gait: Gait is intact.     Deep Tendon Reflexes: Reflexes are normal and symmetric.     Reflex Scores:      Brachioradialis reflexes are 2+ on the right side and 2+ on the left side.      Patellar reflexes are 2+ on the right side and 2+ on the left side. Psychiatric:        Attention and Perception: Attention normal.        Mood and Affect: Mood normal.        Speech: Speech normal.        Behavior: Behavior normal. Behavior is cooperative.        Thought Content: Thought content normal.        Judgment: Judgment normal.    Results for orders placed or performed in visit on 01/05/21  Urinalysis, Routine w reflex microscopic  Result Value Ref Range   Specific Gravity, UA >1.030 (H) 1.005 - 1.030   pH, UA 5.0 5.0 - 7.5  Color, UA Yellow Yellow   Appearance Ur Cloudy (A) Clear   Leukocytes,UA Negative Negative   Protein,UA Negative Negative/Trace   Glucose, UA Negative Negative   Ketones, UA Negative Negative   RBC, UA Negative Negative   Bilirubin, UA Negative Negative   Urobilinogen, Ur 0.2 0.2 - 1.0 mg/dL   Nitrite, UA Negative Negative      Assessment & Plan:   Problem List Items Addressed This Visit       Nervous and Auditory   Relapsing remitting multiple sclerosis ([facility])    Chronic. Controlled. Continue with follow up with Neurology. Call if concerns arise.          Genitourinary   Recurrent nephrolithiasis    Continue to follow up with nephrology. Return to  clinic if symptoms arise.          Other   Hyperlipidemia    Chronic.  Controlled.  Continue with current medication regimen.  Labs ordered today.  Return to clinic in 6 months for reevaluation.  Call sooner if concerns arise.         Other Visit Diagnoses     Annual physical exam    -  Primary   Health Maintenance Reviewed today. PAP completed. Patient declines vaccine. Labs ordered today.    Relevant Orders   CBC with Differential/Platelet   Comprehensive metabolic panel   Lipid panel   TSH   Urinalysis, Routine w reflex microscopic (Completed)   Screening for cervical cancer       PAP obtained in normal fashion. Patient tolerated obtaining of specimen without complication . Escorted by Yvonna Alanis, Ernest.   Relevant Orders   Cytology - PAP   Encounter for hepatitis C screening test for low risk patient       Relevant Orders   Hepatitis C Antibody   Dysuria       UA clear. Will send for culture due to patient's history of UTIs.   Relevant Orders   Urine Culture        Follow up plan: Return in about 1 year (around 01/05/2022) for Physical and Fasting labs.   LABORATORY TESTING:  - Pap smear: pap done  IMMUNIZATIONS:   - Tdap: Tetanus vaccination status reviewed: last tetanus booster within 10 years. - Influenza: Postponed to flu season - Pneumovax: Not applicable - Prevnar: Not applicable - HPV: Refused - Zostavax vaccine: Refused  SCREENING: -Mammogram: Not applicable  - Colonoscopy: Not applicable  - Bone Density: Not applicable  -Hearing Test: Not applicable  -Spirometry: Not applicable   PATIENT COUNSELING:   Advised to take 1 mg of folate supplement per day if capable of pregnancy.   Sexuality: Discussed sexually transmitted diseases, partner selection, use of condoms, avoidance of unintended pregnancy  and contraceptive alternatives.   Advised to avoid cigarette smoking.  I discussed with the patient that most people either abstain from  alcohol or drink within safe limits (<=14/week and <=4 drinks/occasion for males, <=7/weeks and <= 3 drinks/occasion for females) and that the risk for alcohol disorders and other health effects rises proportionally with the number of drinks per week and how often a drinker exceeds daily limits.  Discussed cessation/primary prevention of drug use and availability of treatment for abuse.   Diet: Encouraged to adjust caloric intake to maintain  or achieve ideal body weight, to reduce intake of dietary saturated fat and total fat, to limit sodium intake by avoiding high sodium foods and not adding table salt, and to maintain adequate  dietary potassium and calcium preferably from fresh fruits, vegetables, and low-fat dairy products.    stressed the importance of regular exercise  Injury prevention: Discussed safety belts, safety helmets, smoke detector, smoking near bedding or upholstery.   Dental health: Discussed importance of regular tooth brushing, flossing, and dental visits.    NEXT PREVENTATIVE PHYSICAL DUE IN 1 YEAR. Return in about 1 year (around 01/05/2022) for Physical and Fasting labs.

## 2021-01-05 ENCOUNTER — Other Ambulatory Visit (HOSPITAL_COMMUNITY)
Admission: RE | Admit: 2021-01-05 | Discharge: 2021-01-05 | Disposition: A | Discharge status: Home / Self Care | Payer: 59 | Source: Ambulatory Visit | Attending: Nurse Practitioner | Admitting: Nurse Practitioner

## 2021-01-05 ENCOUNTER — Encounter: Payer: Self-pay | Admitting: Nurse Practitioner

## 2021-01-05 ENCOUNTER — Ambulatory Visit (INDEPENDENT_AMBULATORY_CARE_PROVIDER_SITE_OTHER): Payer: 59 | Admitting: Nurse Practitioner

## 2021-01-05 ENCOUNTER — Other Ambulatory Visit: Payer: Self-pay

## 2021-01-05 VITALS — BP 120/65 | HR 85 | Temp 98.4°F | Ht 65.0 in | Wt 191.0 lb

## 2021-01-05 DIAGNOSIS — E782 Mixed hyperlipidemia: Secondary | ICD-10-CM | POA: Diagnosis not present

## 2021-01-05 DIAGNOSIS — G35 Multiple sclerosis: Secondary | ICD-10-CM | POA: Diagnosis not present

## 2021-01-05 DIAGNOSIS — Z0000 Encounter for general adult medical examination without abnormal findings: Secondary | ICD-10-CM

## 2021-01-05 DIAGNOSIS — N200 Calculus of kidney: Secondary | ICD-10-CM | POA: Diagnosis not present

## 2021-01-05 DIAGNOSIS — Z124 Encounter for screening for malignant neoplasm of cervix: Secondary | ICD-10-CM

## 2021-01-05 DIAGNOSIS — Z1159 Encounter for screening for other viral diseases: Secondary | ICD-10-CM

## 2021-01-05 DIAGNOSIS — R300 Dysuria: Secondary | ICD-10-CM | POA: Diagnosis not present

## 2021-01-05 LAB — URINALYSIS, ROUTINE W REFLEX MICROSCOPIC
Bilirubin, UA: NEGATIVE
Glucose, UA: NEGATIVE
Ketones, UA: NEGATIVE
Leukocytes,UA: NEGATIVE
Nitrite, UA: NEGATIVE
Protein,UA: NEGATIVE
RBC, UA: NEGATIVE
Specific Gravity, UA: >1.03 — ABNORMAL HIGH (ref 1.005–1.030)
Urobilinogen, Ur: 0.2 mg/dL (ref 0.2–1.0)
pH, UA: 5 (ref 5.0–7.5)

## 2021-01-05 NOTE — Assessment & Plan Note (Signed)
Continue to follow up with nephrology. Return to clinic if symptoms arise.

## 2021-01-05 NOTE — Assessment & Plan Note (Signed)
Chronic.  Controlled.  Continue with current medication regimen.  Labs ordered today.  Return to clinic in 6 months for reevaluation.  Call sooner if concerns arise.  ? ?

## 2021-01-05 NOTE — Progress Notes (Signed)
Results discussed with patient during visit.

## 2021-01-05 NOTE — Assessment & Plan Note (Signed)
Chronic. Controlled. Continue with follow up with Neurology. Call if concerns arise.

## 2021-01-06 LAB — CBC WITH DIFFERENTIAL/PLATELET
Basophils Absolute: 0.1 10*3/uL (ref 0.0–0.2)
Basos: 1 %
EOS (ABSOLUTE): 0.2 10*3/uL (ref 0.0–0.4)
Eos: 2 %
Hematocrit: 42 % (ref 34.0–46.6)
Hemoglobin: 14.1 g/dL (ref 11.1–15.9)
Immature Grans (Abs): 0 10*3/uL (ref 0.0–0.1)
Immature Granulocytes: 0 %
Lymphocytes Absolute: 2.3 10*3/uL (ref 0.7–3.1)
Lymphs: 32 %
MCH: 30.4 pg (ref 26.6–33.0)
MCHC: 33.6 g/dL (ref 31.5–35.7)
MCV: 91 fL (ref 79–97)
Monocytes Absolute: 0.4 10*3/uL (ref 0.1–0.9)
Monocytes: 6 %
Neutrophils Absolute: 4.2 10*3/uL (ref 1.4–7.0)
Neutrophils: 59 %
Platelets: 332 10*3/uL (ref 150–450)
RBC: 4.64 x10E6/uL (ref 3.77–5.28)
RDW: 14.5 % (ref 11.7–15.4)
WBC: 7.2 10*3/uL (ref 3.4–10.8)

## 2021-01-06 LAB — LIPID PANEL
Chol/HDL Ratio: 7.1 ratio — ABNORMAL HIGH (ref 0.0–4.4)
Cholesterol, Total: 275 mg/dL — ABNORMAL HIGH (ref 100–199)
HDL: 39 mg/dL — ABNORMAL LOW (ref >39)
LDL Chol Calc (NIH): 140 mg/dL — ABNORMAL HIGH (ref 0–99)
Triglycerides: 512 mg/dL — ABNORMAL HIGH (ref 0–149)
VLDL Cholesterol Cal: 96 mg/dL — ABNORMAL HIGH (ref 5–40)

## 2021-01-06 LAB — COMPREHENSIVE METABOLIC PANEL
ALT: 22 IU/L (ref 0–32)
AST: 19 IU/L (ref 0–40)
Albumin/Globulin Ratio: 1.8 (ref 1.2–2.2)
Albumin: 4.5 g/dL (ref 3.8–4.8)
Alkaline Phosphatase: 49 IU/L (ref 44–121)
BUN/Creatinine Ratio: 14 (ref 9–23)
BUN: 11 mg/dL (ref 6–20)
Bilirubin Total: 0.3 mg/dL (ref 0.0–1.2)
CO2: 20 mmol/L (ref 20–29)
Calcium: 9.6 mg/dL (ref 8.7–10.2)
Chloride: 103 mmol/L (ref 96–106)
Creatinine, Ser: 0.77 mg/dL (ref 0.57–1.00)
Globulin, Total: 2.5 g/dL (ref 1.5–4.5)
Glucose: 79 mg/dL (ref 65–99)
Potassium: 4.2 mmol/L (ref 3.5–5.2)
Sodium: 142 mmol/L (ref 134–144)
Total Protein: 7 g/dL (ref 6.0–8.5)
eGFR: 101 mL/min/{1.73_m2} (ref >59)

## 2021-01-06 LAB — HEPATITIS C ANTIBODY: Hep C Virus Ab: <0.1 s/co ratio (ref 0.0–0.9)

## 2021-01-06 LAB — TSH: TSH: 1.28 u[IU]/mL (ref 0.450–4.500)

## 2021-01-06 NOTE — Addendum Note (Signed)
Addended by: Jon Billings on: 01/06/2021 10:21 AM   Modules accepted: Orders

## 2021-01-06 NOTE — Progress Notes (Signed)
Please make patient a lab appointment.  Hi Shannon Davidson.  It was good to see you yesterday.  Your lab work looks good overall.  However, your cholesterol is pretty high.  I would like you to come back and repeat it fasting since your triglycerides are so elevated.  If they remain up that high we should discuss medication such a fenofibrate tohelp with this.  I also recommend decreasing processed foods and refined sugars to help with your triglycerides.  I haveplaced the lab order.

## 2021-01-06 NOTE — Progress Notes (Signed)
No problem. I already placed the lab order.

## 2021-01-07 LAB — URINE CULTURE

## 2021-01-07 NOTE — Progress Notes (Signed)
Hi Shannon Davidson. Good news.  No growth on your urine culture.  See you at our next visit.

## 2021-01-09 LAB — CYTOLOGY - PAP
Comment: NEGATIVE
Diagnosis: NEGATIVE
High risk HPV: NEGATIVE

## 2021-01-12 NOTE — Progress Notes (Signed)
Hi [name].  Your PAP was normal. We should repeat it in 3 years.

## 2021-01-26 ENCOUNTER — Other Ambulatory Visit: Payer: Self-pay

## 2021-01-27 ENCOUNTER — Encounter: Payer: Self-pay | Admitting: Oncology

## 2021-01-27 ENCOUNTER — Other Ambulatory Visit: Payer: Self-pay

## 2021-01-27 MED ORDER — CARESTART COVID-19 HOME TEST VI KIT
PACK | 0 refills | Status: DC
  Filled 2021-01-27: qty 2, 4d supply, fill #0

## 2021-02-09 ENCOUNTER — Other Ambulatory Visit: Payer: Self-pay

## 2021-02-09 ENCOUNTER — Other Ambulatory Visit: Payer: 59

## 2021-02-09 DIAGNOSIS — N200 Calculus of kidney: Secondary | ICD-10-CM | POA: Diagnosis not present

## 2021-02-24 ENCOUNTER — Other Ambulatory Visit: Payer: Self-pay | Admitting: Urology

## 2021-02-26 ENCOUNTER — Encounter: Payer: Self-pay | Admitting: Urology

## 2021-02-26 ENCOUNTER — Telehealth: Payer: Self-pay | Admitting: Urology

## 2021-03-24 NOTE — Telephone Encounter (Signed)
error 

## 2021-03-24 NOTE — Telephone Encounter (Signed)
Error

## 2021-04-01 ENCOUNTER — Encounter: Payer: Self-pay | Admitting: Urology

## 2021-06-08 ENCOUNTER — Encounter: Payer: Self-pay | Admitting: Nurse Practitioner

## 2021-06-10 ENCOUNTER — Telehealth: Payer: 59 | Admitting: Nurse Practitioner

## 2021-06-10 ENCOUNTER — Other Ambulatory Visit: Payer: Self-pay

## 2021-06-10 ENCOUNTER — Encounter: Payer: Self-pay | Admitting: Nurse Practitioner

## 2021-06-10 DIAGNOSIS — E66811 Obesity, class 1: Secondary | ICD-10-CM | POA: Insufficient documentation

## 2021-06-10 DIAGNOSIS — E669 Obesity, unspecified: Secondary | ICD-10-CM

## 2021-06-10 MED ORDER — OZEMPIC (0.25 OR 0.5 MG/DOSE) 2 MG/1.5ML ~~LOC~~ SOPN
0.2500 mg | PEN_INJECTOR | SUBCUTANEOUS | 1 refills | Status: DC
  Filled 2021-06-10: qty 1.5, 56d supply, fill #0

## 2021-06-10 NOTE — Progress Notes (Signed)
There were no vitals taken for this visit.   Subjective:    Patient ID: Shannon Davidson, female    DOB: 1981/04/12, 40 y.o.   MRN: [id number]  HPI: Shannon Davidson is a 40 y.o. female  Chief Complaint  Patient presents with   Weight Check    WEIGHT MANAGEMENT Patient seen today via telehealth visit to discuss weight loss medications.  Patient has changed her diet to mostly plant based and cut out dairy.  Would like something to aid in additional weight loss.  Denies other concerns at visit today.    Denies HA, CP, SOB, dizziness, palpitations, visual changes, and lower extremity swelling.    Relevant past medical, surgical, family and social history reviewed and updated as indicated. Interim medical history since our last visit reviewed. Allergies and medications reviewed and updated.  Review of Systems  Eyes:  Negative for visual disturbance.  Respiratory:  Negative for cough, chest tightness and shortness of breath.   Cardiovascular:  Negative for chest pain, palpitations and leg swelling.  Neurological:  Negative for dizziness and headaches.   Per HPI unless specifically indicated above     Objective:    There were no vitals taken for this visit.  Wt Readings from Last 3 Encounters:  01/05/21 191 lb (86.6 kg)  01/02/21 180 lb (81.6 kg)  12/02/20 185 lb (83.9 kg)    Physical Exam Vitals and nursing note reviewed.  HENT:     Head: Normocephalic.     Right Ear: Hearing normal.     Left Ear: Hearing normal.     Nose: Nose normal.  Eyes:     Pupils: Pupils are equal, round, and reactive to light.  Pulmonary:     Effort: Pulmonary effort is normal. No respiratory distress.  Neurological:     Mental Status: She is alert.  Psychiatric:        Mood and Affect: Mood normal.        Behavior: Behavior normal.        Thought Content: Thought content normal.        Judgment: Judgment normal.    Results for orders placed or performed in visit on 01/05/21  Urine  Culture   Specimen: Urine   UR  Result Value Ref Range   Urine Culture, Routine Final report    Organism ID, Bacteria Comment   Hepatitis C Antibody  Result Value Ref Range   Hep C Virus Ab <0.1 0.0 - 0.9 s/co ratio  CBC with Differential/Platelet  Result Value Ref Range   WBC 7.2 3.4 - 10.8 x10E3/uL   RBC 4.64 3.77 - 5.28 x10E6/uL   Hemoglobin 14.1 11.1 - 15.9 g/dL   Hematocrit 42.0 34.0 - 46.6 %   MCV 91 79 - 97 fL   MCH 30.4 26.6 - 33.0 pg   MCHC 33.6 31.5 - 35.7 g/dL   RDW 14.5 11.7 - 15.4 %   Platelets 332 150 - 450 x10E3/uL   Neutrophils 59 Not Estab. %   Lymphs 32 Not Estab. %   Monocytes 6 Not Estab. %   Eos 2 Not Estab. %   Basos 1 Not Estab. %   Neutrophils Absolute 4.2 1.4 - 7.0 x10E3/uL   Lymphocytes Absolute 2.3 0.7 - 3.1 x10E3/uL   Monocytes Absolute 0.4 0.1 - 0.9 x10E3/uL   EOS (ABSOLUTE) 0.2 0.0 - 0.4 x10E3/uL   Basophils Absolute 0.1 0.0 - 0.2 x10E3/uL   Immature Granulocytes 0 Not Estab. %  Immature Grans (Abs) 0.0 0.0 - 0.1 x10E3/uL  Comprehensive metabolic panel  Result Value Ref Range   Glucose 79 65 - 99 mg/dL   BUN 11 6 - 20 mg/dL   Creatinine, Ser 0.77 0.57 - 1.00 mg/dL   eGFR 101 >59 mL/min/1.73   BUN/Creatinine Ratio 14 9 - 23   Sodium 142 134 - 144 mmol/L   Potassium 4.2 3.5 - 5.2 mmol/L   Chloride 103 96 - 106 mmol/L   CO2 20 20 - 29 mmol/L   Calcium 9.6 8.7 - 10.2 mg/dL   Total Protein 7.0 6.0 - 8.5 g/dL   Albumin 4.5 3.8 - 4.8 g/dL   Globulin, Total 2.5 1.5 - 4.5 g/dL   Albumin/Globulin Ratio 1.8 1.2 - 2.2   Bilirubin Total 0.3 0.0 - 1.2 mg/dL   Alkaline Phosphatase 49 44 - 121 IU/L   AST 19 0 - 40 IU/L   ALT 22 0 - 32 IU/L  Lipid panel  Result Value Ref Range   Cholesterol, Total 275 (H) 100 - 199 mg/dL   Triglycerides 512 (H) 0 - 149 mg/dL   HDL 39 (L) >39 mg/dL   VLDL Cholesterol Cal 96 (H) 5 - 40 mg/dL   LDL Chol Calc (NIH) 140 (H) 0 - 99 mg/dL   Chol/HDL Ratio 7.1 (H) 0.0 - 4.4 ratio  TSH  Result Value Ref Range   TSH  1.280 0.450 - 4.500 uIU/mL  Urinalysis, Routine w reflex microscopic  Result Value Ref Range   Specific Gravity, UA >1.030 (H) 1.005 - 1.030   pH, UA 5.0 5.0 - 7.5   Color, UA Yellow Yellow   Appearance Ur Cloudy (A) Clear   Leukocytes,UA Negative Negative   Protein,UA Negative Negative/Trace   Glucose, UA Negative Negative   Ketones, UA Negative Negative   RBC, UA Negative Negative   Bilirubin, UA Negative Negative   Urobilinogen, Ur 0.2 0.2 - 1.0 mg/dL   Nitrite, UA Negative Negative  Cytology - PAP  Result Value Ref Range   High risk HPV Negative    Adequacy      Satisfactory for evaluation; transformation zone component PRESENT.   Diagnosis      - Negative for intraepithelial lesion or malignancy (NILM)   Comment Normal Reference Range HPV - Negative       Assessment & Plan:   Problem List Items Addressed This Visit       Other   Obesity (BMI 30.0-34.9) - Primary    Will begin Ozempic once weekly to aid in weight loss. Benefits and risks of medication discussed with patient during visit today.  Will start with 0.7m weekly for 1 month. If tolerating medication well after 1 month will increase to 0.56mweekly.  Follow up in 1 month for reevaluation.  Patient understands and agrees with the plan of care.         Follow up plan: Return for Weight Managment.   This visit was completed via MyChart due to the restrictions of the COVID-19 pandemic. All issues as above were discussed and addressed. Physical exam was done as above through visual confirmation on MyChart. If it was felt that the patient should be evaluated in the office, they were directed there. The patient verbally consented to this visit. Location of the patient: Work Location of the provider: Office Those involved with this call:  Provider: KaJon BillingsNP CMA: BrYvonna AlanisCMLoxahatchee Grovesesk/Registration: CaMyrlene Brokerhis encounter was conducted via video.  I spent 20  dedicated to the care of  this patient on the date of this encounter to include previsit review of 30, face to face time with the patient, and post visit ordering of testing.

## 2021-06-10 NOTE — Assessment & Plan Note (Signed)
Will begin Ozempic once weekly to aid in weight loss. Benefits and risks of medication discussed with patient during visit today.  Will start with 0.25mg  weekly for 1 month. If tolerating medication well after 1 month will increase to 0.5mg  weekly.  Follow up in 1 month for reevaluation.  Patient understands and agrees with the plan of care.

## 2021-06-15 NOTE — Progress Notes (Signed)
Lmom for patient to call back to schedule follow up appointment.  (Follow up in 1 month for reevaluation-Return for Weight Management)

## 2021-06-23 ENCOUNTER — Ambulatory Visit: Payer: 59 | Admitting: Nurse Practitioner

## 2021-07-14 ENCOUNTER — Other Ambulatory Visit: Payer: Self-pay | Admitting: *Deleted

## 2021-07-14 DIAGNOSIS — N201 Calculus of ureter: Secondary | ICD-10-CM

## 2021-07-15 ENCOUNTER — Ambulatory Visit: Payer: Self-pay | Admitting: Urology

## 2021-07-17 ENCOUNTER — Encounter: Payer: Self-pay | Admitting: Urology

## 2021-07-17 NOTE — Progress Notes (Signed)
BP 120/73    Pulse 74    Temp 99 F (37.2 C) (Oral)    Wt 198 lb 12.8 oz (90.2 kg)    LMP 07/06/2021 (Approximate)    SpO2 98%    BMI 33.08 kg/m    Subjective:    Patient ID: Shannon Davidson, female    DOB: September 23, 1980, 41 y.o.   MRN: [id number]  HPI: Shannon Davidson is a 41 y.o. female  Chief Complaint  Patient presents with   Obesity    Pt states she stopped the ozempic. States she did not like the way it made her feel.     WEIGHT MANAGEMENT Patient seen today to follow up on weight loss.  She states she stopped the Ozempic because she did not like the way it made her feel.  She took the medication for two weeks.  She was having nausea, fatigue, depression and pain in her abdomen.  Symptoms have improved since stopping the medication.   Denies HA, CP, SOB, dizziness, palpitations, visual changes, and lower extremity swelling.    Relevant past medical, surgical, family and social history reviewed and updated as indicated. Interim medical history since our last visit reviewed. Allergies and medications reviewed and updated.  Review of Systems  Eyes:  Negative for visual disturbance.  Respiratory:  Negative for cough, chest tightness and shortness of breath.   Cardiovascular:  Negative for chest pain, palpitations and leg swelling.  Neurological:  Negative for dizziness and headaches.   Per HPI unless specifically indicated above     Objective:    BP 120/73    Pulse 74    Temp 99 F (37.2 C) (Oral)    Wt 198 lb 12.8 oz (90.2 kg)    LMP 07/06/2021 (Approximate)    SpO2 98%    BMI 33.08 kg/m   Wt Readings from Last 3 Encounters:  07/20/21 198 lb 12.8 oz (90.2 kg)  01/05/21 191 lb (86.6 kg)  01/02/21 180 lb (81.6 kg)    Physical Exam Vitals and nursing note reviewed.  Constitutional:      General: She is not in acute distress.    Appearance: Normal appearance. She is normal weight. She is not ill-appearing, toxic-appearing or diaphoretic.  HENT:     Head: Normocephalic.      Right Ear: External ear normal.     Left Ear: External ear normal.     Nose: Nose normal.     Mouth/Throat:     Mouth: Mucous membranes are moist.     Pharynx: Oropharynx is clear.  Eyes:     General:        Right eye: No discharge.        Left eye: No discharge.     Extraocular Movements: Extraocular movements intact.     Conjunctiva/sclera: Conjunctivae normal.     Pupils: Pupils are equal, round, and reactive to light.  Cardiovascular:     Rate and Rhythm: Normal rate and regular rhythm.     Heart sounds: No murmur heard. Pulmonary:     Effort: Pulmonary effort is normal. No respiratory distress.     Breath sounds: Normal breath sounds. No wheezing or rales.  Musculoskeletal:     Cervical back: Normal range of motion and neck supple.  Skin:    General: Skin is warm and dry.     Capillary Refill: Capillary refill takes less than 2 seconds.  Neurological:     General: No focal deficit present.  Mental Status: She is alert and oriented to person, place, and time. Mental status is at baseline.  Psychiatric:        Mood and Affect: Mood normal.        Behavior: Behavior normal.        Thought Content: Thought content normal.        Judgment: Judgment normal.    Results for orders placed or performed in visit on 01/05/21  Urine Culture   Specimen: Urine   UR  Result Value Ref Range   Urine Culture, Routine Final report    Organism ID, Bacteria Comment   Hepatitis C Antibody  Result Value Ref Range   Hep C Virus Ab <0.1 0.0 - 0.9 s/co ratio  CBC with Differential/Platelet  Result Value Ref Range   WBC 7.2 3.4 - 10.8 x10E3/uL   RBC 4.64 3.77 - 5.28 x10E6/uL   Hemoglobin 14.1 11.1 - 15.9 g/dL   Hematocrit 42.0 34.0 - 46.6 %   MCV 91 79 - 97 fL   MCH 30.4 26.6 - 33.0 pg   MCHC 33.6 31.5 - 35.7 g/dL   RDW 14.5 11.7 - 15.4 %   Platelets 332 150 - 450 x10E3/uL   Neutrophils 59 Not Estab. %   Lymphs 32 Not Estab. %   Monocytes 6 Not Estab. %   Eos 2 Not Estab. %    Basos 1 Not Estab. %   Neutrophils Absolute 4.2 1.4 - 7.0 x10E3/uL   Lymphocytes Absolute 2.3 0.7 - 3.1 x10E3/uL   Monocytes Absolute 0.4 0.1 - 0.9 x10E3/uL   EOS (ABSOLUTE) 0.2 0.0 - 0.4 x10E3/uL   Basophils Absolute 0.1 0.0 - 0.2 x10E3/uL   Immature Granulocytes 0 Not Estab. %   Immature Grans (Abs) 0.0 0.0 - 0.1 x10E3/uL  Comprehensive metabolic panel  Result Value Ref Range   Glucose 79 65 - 99 mg/dL   BUN 11 6 - 20 mg/dL   Creatinine, Ser 0.77 0.57 - 1.00 mg/dL   eGFR 101 >59 mL/min/1.73   BUN/Creatinine Ratio 14 9 - 23   Sodium 142 134 - 144 mmol/L   Potassium 4.2 3.5 - 5.2 mmol/L   Chloride 103 96 - 106 mmol/L   CO2 20 20 - 29 mmol/L   Calcium 9.6 8.7 - 10.2 mg/dL   Total Protein 7.0 6.0 - 8.5 g/dL   Albumin 4.5 3.8 - 4.8 g/dL   Globulin, Total 2.5 1.5 - 4.5 g/dL   Albumin/Globulin Ratio 1.8 1.2 - 2.2   Bilirubin Total 0.3 0.0 - 1.2 mg/dL   Alkaline Phosphatase 49 44 - 121 IU/L   AST 19 0 - 40 IU/L   ALT 22 0 - 32 IU/L  Lipid panel  Result Value Ref Range   Cholesterol, Total 275 (H) 100 - 199 mg/dL   Triglycerides 512 (H) 0 - 149 mg/dL   HDL 39 (L) >39 mg/dL   VLDL Cholesterol Cal 96 (H) 5 - 40 mg/dL   LDL Chol Calc (NIH) 140 (H) 0 - 99 mg/dL   Chol/HDL Ratio 7.1 (H) 0.0 - 4.4 ratio  TSH  Result Value Ref Range   TSH 1.280 0.450 - 4.500 uIU/mL  Urinalysis, Routine w reflex microscopic  Result Value Ref Range   Specific Gravity, UA >1.030 (H) 1.005 - 1.030   pH, UA 5.0 5.0 - 7.5   Color, UA Yellow Yellow   Appearance Ur Cloudy (A) Clear   Leukocytes,UA Negative Negative   Protein,UA Negative Negative/Trace   Glucose,  UA Negative Negative   Ketones, UA Negative Negative   RBC, UA Negative Negative   Bilirubin, UA Negative Negative   Urobilinogen, Ur 0.2 0.2 - 1.0 mg/dL   Nitrite, UA Negative Negative  Cytology - PAP  Result Value Ref Range   High risk HPV Negative    Adequacy      Satisfactory for evaluation; transformation zone component PRESENT.    Diagnosis      - Negative for intraepithelial lesion or malignancy (NILM)   Comment Normal Reference Range HPV - Negative       Assessment & Plan:   Problem List Items Addressed This Visit       Other   Obesity (BMI 30.0-34.9) - Primary    Ongoing. Has stopped Ozempic. Will continue with diet, exercise and intermittent fasting.  Agreed to work on diet and exercise over the next 6 months. Will redraw labs at 6 month visit.  Call sooner if concerns arise.        Follow up plan: Return in about 6 months (around 01/17/2022) for Physical and Fasting labs.

## 2021-07-20 ENCOUNTER — Ambulatory Visit: Payer: 59 | Admitting: Nurse Practitioner

## 2021-07-20 ENCOUNTER — Encounter: Payer: Self-pay | Admitting: Nurse Practitioner

## 2021-07-20 ENCOUNTER — Other Ambulatory Visit: Payer: Self-pay

## 2021-07-20 VITALS — BP 120/73 | HR 74 | Temp 99.0°F | Wt 198.8 lb

## 2021-07-20 DIAGNOSIS — E669 Obesity, unspecified: Secondary | ICD-10-CM | POA: Diagnosis not present

## 2021-07-20 DIAGNOSIS — Z7689 Persons encountering health services in other specified circumstances: Secondary | ICD-10-CM

## 2021-07-20 NOTE — Assessment & Plan Note (Signed)
Ongoing. Has stopped Ozempic. Will continue with diet, exercise and intermittent fasting.  Agreed to work on diet and exercise over the next 6 months. Will redraw labs at 6 month visit.  Call sooner if concerns arise.

## 2021-07-31 ENCOUNTER — Encounter: Payer: Self-pay | Admitting: Nurse Practitioner

## 2021-08-04 ENCOUNTER — Telehealth (INDEPENDENT_AMBULATORY_CARE_PROVIDER_SITE_OTHER): Payer: 59 | Admitting: Nurse Practitioner

## 2021-08-04 ENCOUNTER — Encounter: Payer: Self-pay | Admitting: Nurse Practitioner

## 2021-08-04 DIAGNOSIS — F4323 Adjustment disorder with mixed anxiety and depressed mood: Secondary | ICD-10-CM

## 2021-08-04 DIAGNOSIS — N898 Other specified noninflammatory disorders of vagina: Secondary | ICD-10-CM | POA: Diagnosis not present

## 2021-08-04 MED ORDER — FLUCONAZOLE 150 MG PO TABS: 150.0000 mg | ORAL_TABLET | Freq: Once | ORAL | 0 refills | Status: AC

## 2021-08-04 MED ORDER — LEXAPRO 5 MG PO TABS: 5.0000 mg | ORAL_TABLET | Freq: Every day | ORAL | 0 refills | Status: DC

## 2021-08-04 NOTE — Progress Notes (Signed)
LMP 07/06/2021 (Approximate)    Subjective:    Patient ID: Shannon Davidson, female    DOB: 1980/11/26, 41 y.o.   MRN: [id number]  HPI: Shannon Davidson is a 41 y.o. female  Chief Complaint  Patient presents with   Anxiety   ANXIETY/DEPRESSION Patient states things aren't going as great as they were. States she is struggling with depression.  She went to work last Thursday and had to leave. She felt like she couldn't think straight and couldn't stop crying.  She talked with the [facility] counselor who recommended she speak to her PCP about medication.  She feels like she may have had a couple of anxiety attacks.  Her HR is jumping to 1150-120 at rest.  This past weekend she had an overwhelming feeling.  Her fatigue has been worse, having headaches.  [facility]  She has tried Zoloft, Cymbalta and Wellbutrin.  She has tried Lexapro in the past and did not do as well with the Generic.  Flowsheet Row Video Visit from 08/04/2021 in [facility]  PHQ-9 Total Score 14      GAD 7 : Generalized Anxiety Score 08/04/2021  Nervous, Anxious, on Edge 1  Control/stop worrying 1  Worry too much - different things 1  Trouble relaxing 1  Restless 0  Easily annoyed or irritable 0  Afraid - awful might happen 1  Total GAD 7 Score 5  Anxiety Difficulty Extremely difficult   Patient has had vaginal discomfort.  No odor, no discharge.  But she does have some itching.     Relevant past medical, surgical, family and social history reviewed and updated as indicated. Interim medical history since our last visit reviewed. Allergies and medications reviewed and updated.  Review of Systems  Genitourinary:        Vaginal itching  Psychiatric/Behavioral:  Positive for dysphoric mood. The patient is nervous/anxious.    Per HPI unless specifically indicated above     Objective:    LMP 07/06/2021 (Approximate)   Wt Readings from Last 3 Encounters:  07/20/21 198 lb 12.8 oz  (90.2 kg)  01/05/21 191 lb (86.6 kg)  01/02/21 180 lb (81.6 kg)    Physical Exam Vitals and nursing note reviewed.  HENT:     Head: Normocephalic.     Right Ear: Hearing normal.     Left Ear: Hearing normal.     Nose: Nose normal.  Eyes:     Pupils: Pupils are equal, round, and reactive to light.  Pulmonary:     Effort: Pulmonary effort is normal. No respiratory distress.  Neurological:     Mental Status: She is alert.  Psychiatric:        Mood and Affect: Mood normal.        Behavior: Behavior normal.        Thought Content: Thought content normal.        Judgment: Judgment normal.    Results for orders placed or performed in visit on 01/05/21  Urine Culture   Specimen: Urine   UR  Result Value Ref Range   Urine Culture, Routine Final report    Organism ID, Bacteria Comment   Hepatitis C Antibody  Result Value Ref Range   Hep C Virus Ab <0.1 0.0 - 0.9 s/co ratio  CBC with Differential/Platelet  Result Value Ref Range   WBC 7.2 3.4 - 10.8 x10E3/uL   RBC 4.64 3.77 - 5.28 x10E6/uL   Hemoglobin 14.1 11.1 -  15.9 g/dL   Hematocrit 42.0 34.0 - 46.6 %   MCV 91 79 - 97 fL   MCH 30.4 26.6 - 33.0 pg   MCHC 33.6 31.5 - 35.7 g/dL   RDW 14.5 11.7 - 15.4 %   Platelets 332 150 - 450 x10E3/uL   Neutrophils 59 Not Estab. %   Lymphs 32 Not Estab. %   Monocytes 6 Not Estab. %   Eos 2 Not Estab. %   Basos 1 Not Estab. %   Neutrophils Absolute 4.2 1.4 - 7.0 x10E3/uL   Lymphocytes Absolute 2.3 0.7 - 3.1 x10E3/uL   Monocytes Absolute 0.4 0.1 - 0.9 x10E3/uL   EOS (ABSOLUTE) 0.2 0.0 - 0.4 x10E3/uL   Basophils Absolute 0.1 0.0 - 0.2 x10E3/uL   Immature Granulocytes 0 Not Estab. %   Immature Grans (Abs) 0.0 0.0 - 0.1 x10E3/uL  Comprehensive metabolic panel  Result Value Ref Range   Glucose 79 65 - 99 mg/dL   BUN 11 6 - 20 mg/dL   Creatinine, Ser 0.77 0.57 - 1.00 mg/dL   eGFR 101 >59 mL/min/1.73   BUN/Creatinine Ratio 14 9 - 23   Sodium 142 134 - 144 mmol/L   Potassium 4.2 3.5 -  5.2 mmol/L   Chloride 103 96 - 106 mmol/L   CO2 20 20 - 29 mmol/L   Calcium 9.6 8.7 - 10.2 mg/dL   Total Protein 7.0 6.0 - 8.5 g/dL   Albumin 4.5 3.8 - 4.8 g/dL   Globulin, Total 2.5 1.5 - 4.5 g/dL   Albumin/Globulin Ratio 1.8 1.2 - 2.2   Bilirubin Total 0.3 0.0 - 1.2 mg/dL   Alkaline Phosphatase 49 44 - 121 IU/L   AST 19 0 - 40 IU/L   ALT 22 0 - 32 IU/L  Lipid panel  Result Value Ref Range   Cholesterol, Total 275 (H) 100 - 199 mg/dL   Triglycerides 512 (H) 0 - 149 mg/dL   HDL 39 (L) >39 mg/dL   VLDL Cholesterol Cal 96 (H) 5 - 40 mg/dL   LDL Chol Calc (NIH) 140 (H) 0 - 99 mg/dL   Chol/HDL Ratio 7.1 (H) 0.0 - 4.4 ratio  TSH  Result Value Ref Range   TSH 1.280 0.450 - 4.500 uIU/mL  Urinalysis, Routine w reflex microscopic  Result Value Ref Range   Specific Gravity, UA >1.030 (H) 1.005 - 1.030   pH, UA 5.0 5.0 - 7.5   Color, UA Yellow Yellow   Appearance Ur Cloudy (A) Clear   Leukocytes,UA Negative Negative   Protein,UA Negative Negative/Trace   Glucose, UA Negative Negative   Ketones, UA Negative Negative   RBC, UA Negative Negative   Bilirubin, UA Negative Negative   Urobilinogen, Ur 0.2 0.2 - 1.0 mg/dL   Nitrite, UA Negative Negative  Cytology - PAP  Result Value Ref Range   High risk HPV Negative    Adequacy      Satisfactory for evaluation; transformation zone component PRESENT.   Diagnosis      - Negative for intraepithelial lesion or malignancy (NILM)   Comment Normal Reference Range HPV - Negative       Assessment & Plan:   Problem List Items Addressed This Visit       Other   Adjustment disorder with mixed anxiety and depressed mood - Primary    Chronic. Not well controlled.  Recently exacerbated.  Has tried Zoloft, Wellbutrin, and Cymbalta.  She has been on Lexapro in the past. When it was switched  to the generic she did not do as well with the medication.  Will as for the brand name to be dispensed.  Follow up in 2 weeks for reevalauation.        Other Visit Diagnoses     Vaginal itching       Will treat with 1 dose of Fluconazole. If symptoms do not improve will do Wet prep. FU if symptoms do not improve.        Follow up plan: Return in about 2 weeks (around 08/18/2021) for Depression/Anxiety FU (virtual).   This visit was completed via MyChart due to the restrictions of the COVID-19 pandemic. All issues as above were discussed and addressed. Physical exam was done as above through visual confirmation on MyChart. If it was felt that the patient should be evaluated in the office, they were directed there. The patient verbally consented to this visit. Location of the patient: Home Location of the provider: Office Those involved with this call:  Provider: Jon Billings, NP CMA: Jodie Echevaria, [place] Desk/Registration: Myrlene Broker This encounter was conducted via video.  I spent 20 dedicated to the care of this patient on the date of this encounter to include previsit review of 30, face to face time with the patient, and post visit ordering of testing.

## 2021-08-04 NOTE — Assessment & Plan Note (Signed)
Chronic. Not well controlled.  Recently exacerbated.  Has tried Zoloft, Wellbutrin, and Cymbalta.  She has been on Lexapro in the past. When it was switched to the generic she did not do as well with the medication.  Will as for the brand name to be dispensed.  Follow up in 2 weeks for reevalauation.

## 2021-08-04 NOTE — Progress Notes (Signed)
Unable to leave vm to schedule appt due to full mailbox.

## 2021-08-05 NOTE — Progress Notes (Signed)
2nd attempt- Unable to leave vm to schedule fu appt

## 2021-08-07 ENCOUNTER — Telehealth: Payer: Self-pay | Admitting: Nurse Practitioner

## 2021-08-07 ENCOUNTER — Encounter: Payer: Self-pay | Admitting: Nurse Practitioner

## 2021-08-07 ENCOUNTER — Telehealth: Payer: Self-pay

## 2021-08-07 NOTE — Telephone Encounter (Signed)
PA started for Lexapro 5mg   through [employer]. Awaiting on determination

## 2021-08-07 NOTE — Progress Notes (Signed)
3rd attempt- Lmom asking pt to call back to schedule an appt. °

## 2021-08-07 NOTE — Telephone Encounter (Signed)
Informed patient that her FMLA paperwork has been faxed to [employer] and that we are still waiting on determination for Lexapro

## 2021-08-07 NOTE — Telephone Encounter (Signed)
Patient called about status of prior auth for lexapro and FMLA paperwork. Please call back

## 2021-08-10 MED ORDER — ALPRAZOLAM 0.25 MG PO TABS: 0.2500 mg | ORAL_TABLET | Freq: Two times a day (BID) | ORAL | 0 refills | Status: DC | PRN

## 2021-08-10 NOTE — Telephone Encounter (Signed)
Was the prior auth done?

## 2021-08-12 ENCOUNTER — Telehealth: Payer: Self-pay | Admitting: Nurse Practitioner

## 2021-08-12 NOTE — Telephone Encounter (Signed)
Updated patient on her Lexapro prescription prior authorization via [place]. Advised patient to give our office a call back if she has any questions or concerns.

## 2021-08-12 NOTE — Telephone Encounter (Signed)
Patient called in waiting on status of suth for lexpro

## 2021-08-14 ENCOUNTER — Telehealth: Payer: Self-pay | Admitting: Nurse Practitioner

## 2021-08-14 NOTE — Telephone Encounter (Signed)
Let Anderson Malta know that we checked the PA and it has still not been determined yet. We will give her a call when it has been.

## 2021-08-14 NOTE — Telephone Encounter (Signed)
Pt is calling to check on the status of the FMLA paperwork. Pt has a new deadline 08/23/21. To [employer]- Fax-- [employer] reported as of today they have not received it. [phone]

## 2021-08-14 NOTE — Telephone Encounter (Signed)
Pt is calling to check on the status of the PA. Has there been any update? Pt states that she is going to have to call out of work on Monday if she does not get this medication.  Pt wants to know is there a 72 hour window before there is a response.

## 2021-08-17 NOTE — Telephone Encounter (Signed)
FMLA paperwork appears to have been faxed 08/07/21. Re-faxed paperwork this morning again to [employer] with confirmation.  Called and LVM notifying patient that paperwork has been resent for her.

## 2021-08-21 ENCOUNTER — Telehealth: Payer: Self-pay | Admitting: Nurse Practitioner

## 2021-08-21 MED ORDER — ESCITALOPRAM OXALATE 5 MG PO TABS: 5.0000 mg | ORAL_TABLET | Freq: Every day | ORAL | 0 refills | Status: DC

## 2021-08-21 NOTE — Telephone Encounter (Signed)
Please let patient know that we absolutely submitted the prior auth for the brand name which was denied.  Before we responded to her we submitted an appeal to try and get the brand name approved.  We received the response this morning that it is still denied.  Her other options are the generic, Fluoxitine, or Citalopram.

## 2021-08-21 NOTE — Telephone Encounter (Signed)
Copied from [place] [id number]. Topic: General - Other >> Aug 21, 2021  8:52 AM Leward Quan A wrote: Reason for CRM: Patient called in to inform Jon Billings that she have cancelled her appointment because she never received the medication and it did not make sense to be seen for a medication that was never received. Patient also asking for a call back about the prior authorization tha never happened why she never received the medicine. Please call Ph# [phone]

## 2021-08-25 ENCOUNTER — Telehealth: Payer: 59 | Admitting: Nurse Practitioner

## 2021-09-01 DIAGNOSIS — D2239 Melanocytic nevi of other parts of face: Secondary | ICD-10-CM | POA: Diagnosis not present

## 2021-09-01 DIAGNOSIS — L570 Actinic keratosis: Secondary | ICD-10-CM | POA: Diagnosis not present

## 2021-09-01 DIAGNOSIS — L814 Other melanin hyperpigmentation: Secondary | ICD-10-CM | POA: Diagnosis not present

## 2021-09-04 ENCOUNTER — Encounter: Payer: Self-pay | Admitting: Urology

## 2021-09-04 ENCOUNTER — Ambulatory Visit
Admission: RE | Admit: 2021-09-04 | Discharge: 2021-09-04 | Disposition: A | Discharge status: Home / Self Care | Payer: 59 | Source: Ambulatory Visit | Attending: Urology | Admitting: Urology

## 2021-09-04 ENCOUNTER — Other Ambulatory Visit: Payer: Self-pay

## 2021-09-04 ENCOUNTER — Ambulatory Visit (INDEPENDENT_AMBULATORY_CARE_PROVIDER_SITE_OTHER): Payer: 59 | Admitting: Urology

## 2021-09-04 ENCOUNTER — Ambulatory Visit
Admission: RE | Admit: 2021-09-04 | Discharge: 2021-09-04 | Disposition: A | Discharge status: Home / Self Care | Payer: 59 | Attending: Urology | Admitting: Urology

## 2021-09-04 VITALS — BP 144/89 | HR 71 | Ht 65.0 in | Wt 185.0 lb

## 2021-09-04 DIAGNOSIS — R82993 Hyperuricosuria: Secondary | ICD-10-CM | POA: Diagnosis not present

## 2021-09-04 DIAGNOSIS — N200 Calculus of kidney: Secondary | ICD-10-CM | POA: Diagnosis not present

## 2021-09-04 DIAGNOSIS — N201 Calculus of ureter: Secondary | ICD-10-CM | POA: Insufficient documentation

## 2021-09-04 DIAGNOSIS — R82994 Hypercalciuria: Secondary | ICD-10-CM

## 2021-09-04 DIAGNOSIS — Z87442 Personal history of urinary calculi: Secondary | ICD-10-CM

## 2021-09-04 NOTE — Progress Notes (Signed)
09/04/2021 12:48 PM   Shannon Davidson 19-Dec-1980 [id number]  Referring provider: Jon Billings, NP [address] [place],  [place]annon [place] [address]  Chief Complaint  Patient presents with   Nephrolithiasis    Urologic history: 1.  Nephrolithiasis Stent placement [facility] 11/22/2018 2-4 mm left proximal ureteral calculus with infection (Klebsiella) Left ureteroscopy with laser lithotripsy left ureteral and renal calculi 3/78/5885 Metabolic evaluation: Urine volume 2.43 L; hypercalciuria 297 mg; hyperuricosuria 0.9 mg; supersaturation CaOxDi elevated 7.1   HPI: 41 y.o. female presents for 8-month follow-up.  No problems since last visit She has significantly reduced her animal protein intake No flank, abdominal or pelvic pain   PMH: Past Medical History:  Diagnosis Date   Babesiasis    Bartonella infection    Elevated EBV antibody titer    History of kidney stones    HLD (hyperlipidemia)    Kidney stone    Dr. Bernardo Heater   Lyme disease    Palpitations    Pap smear for cervical cancer screening 2012   Dr. Benjie Karvonen   PCOS (polycystic ovarian syndrome)    Relapsing remitting multiple sclerosis ([place]) 11/25/2014   Patient says that all her MS symptoms went away in 2017-2018 after she was treated for tick-bourne illnesses    Surgical History: Past Surgical History:  Procedure Laterality Date   APPENDECTOMY  09/2019   CESAREAN SECTION  2008   CYSTOSCOPY/URETEROSCOPY/HOLMIUM LASER/STENT PLACEMENT Left 12/02/2020   Procedure: CYSTOSCOPY/URETEROSCOPY/HOLMIUM LASER/STENT EXCHANGE;  Surgeon: Abbie Sons, MD;  Location: [facility];  Service: Urology;  Laterality: Left;   LITHOTRIPSY     TONSILLECTOMY N/A 05/20/2016   Procedure: TONSILLECTOMY;  Surgeon: Margaretha Sheffield, MD;  Location: [facility];  Service: ENT;  Laterality: N/A;   TONSILLECTOMY      Home Medications:  Allergies as of 09/04/2021       Reactions   Egg White (egg Protein) Swelling   Influenza Vaccines Swelling         Medication List        Accurate as of September 04, 2021 12:48 PM. If you have any questions, ask your nurse or doctor.          STOP taking these medications    escitalopram 5 MG tablet Commonly known as: Lexapro Stopped by: Abbie Sons, MD       TAKE these medications    ALPRAZolam 0.25 MG tablet Commonly known as: XANAX Take 1 tablet (0.25 mg total) by mouth 2 (two) times daily as needed for anxiety.        Allergies:  Allergies  Allergen Reactions   Egg White (Egg Protein) Swelling   Influenza Vaccines Swelling    Family History: Family History  Problem Relation Age of Onset   Heart disease Maternal Grandmother 38       s/p CABG   Dementia Maternal Grandmother    Hypertension Mother    Seizures Father    Hyperlipidemia Father    Achalasia Sister    COPD Maternal Grandfather    Diabetes Paternal Grandmother     Social History:  reports that she has never smoked. She has never used smokeless tobacco. She reports current alcohol use. She reports that she does not use drugs.   Physical Exam: BP (!) 144/89    Pulse 71    Ht 5\' 5"  (1.651 m)    Wt 185 lb (83.9 kg)    BMI 30.79 kg/m   Constitutional:  Alert and oriented, No acute distress. HEENT:  [place] AT, moist mucus membranes.  Trachea midline, no masses. Cardiovascular: No clubbing, cyanosis, or edema. Respiratory: Normal respiratory effort, no increased work of breathing. Psychiatric: Normal mood and affect.   Pertinent Imaging: Images of a KUB performed earlier today were personally reviewed and interpreted.  No calcifications overlying the renal outlines or expected course of the ureter are identified  Assessment & Plan:    1.  History nephrolithiasis KUB today negative 1 year follow-up with KUB  2.  Hypercalciuria Repeat 24-hour urine study  3.  Hyperuricosuria As above   Abbie Sons, [place] [address] [place], [place] [address] [phone]

## 2021-09-04 NOTE — Patient Instructions (Signed)

## 2021-09-08 ENCOUNTER — Telehealth (INDEPENDENT_AMBULATORY_CARE_PROVIDER_SITE_OTHER): Payer: 59 | Admitting: Nurse Practitioner

## 2021-09-08 ENCOUNTER — Encounter: Payer: Self-pay | Admitting: Nurse Practitioner

## 2021-09-08 DIAGNOSIS — F4323 Adjustment disorder with mixed anxiety and depressed mood: Secondary | ICD-10-CM | POA: Diagnosis not present

## 2021-09-08 MED ORDER — ALPRAZOLAM 0.25 MG PO TABS: 0.2500 mg | ORAL_TABLET | Freq: Two times a day (BID) | ORAL | 0 refills | Status: DC | PRN

## 2021-09-08 MED ORDER — BUPROPION HCL ER (SR) 100 MG PO TB12: 100.0000 mg | ORAL_TABLET | Freq: Every day | ORAL | 0 refills | Status: DC

## 2021-09-08 NOTE — Assessment & Plan Note (Signed)
Chronic.  Uncontrolled.  Will try Wellbutrin 100mg  daily.  Discussed side effects and benefits of medication with patient during visit.  Return to clinic in 2 weeks for reevaluation.  Call sooner if concerns arise.

## 2021-09-08 NOTE — Progress Notes (Signed)
Lmom asking pt to call back to schedule fu appt. °

## 2021-09-08 NOTE — Progress Notes (Signed)
There were no vitals taken for this visit.   Subjective:    Patient ID: Shannon Davidson, female    DOB: 1980-12-14, 41 y.o.   MRN: [id number]  HPI: Shannon Davidson is a 41 y.o. female  Chief Complaint  Patient presents with   Depression   ANXIETY/DEPRESSION Patient states she wasn't able to continue with the Lexapro.  She took it for about a week then started feeling her muscles lock up.  Her face felt weird.  Denies an allergic reaction.  States she didn't like the way it made her feel.  Felt like she didn't have any emotion.  Patient is still going to [facility] for therapy.  She has joined a gym.  She has had a couple more times where she hasn't been able to stop crying.  She did have to use the xanax because she has had two panic attacks.  She felt like she had a few coming on and took it then but it helped to prevent them from being full panic attacks. Patient also had terrible side effects with Zoloft and Celexa.   Flowsheet Row Video Visit from 09/08/2021 in [place]  PHQ-9 Total Score 13      GAD 7 : Generalized Anxiety Score 09/08/2021 08/04/2021  Nervous, Anxious, on Edge 1 1  Control/stop worrying 2 1  Worry too much - different things 2 1  Trouble relaxing 2 1  Restless 0 0  Easily annoyed or irritable 1 0  Afraid - awful might happen 1 1  Total GAD 7 Score 9 5  Anxiety Difficulty Somewhat difficult Extremely difficult   Patient has had vaginal discomfort.  No odor, no discharge.  But she does have some itching.     Relevant past medical, surgical, family and social history reviewed and updated as indicated. Interim medical history since our last visit reviewed. Allergies and medications reviewed and updated.  Review of Systems  Genitourinary:        Vaginal itching  Psychiatric/Behavioral:  Positive for dysphoric mood. Negative for suicidal ideas. The patient is nervous/anxious.    Per HPI unless specifically indicated above     Objective:    There  were no vitals taken for this visit.  Wt Readings from Last 3 Encounters:  09/04/21 185 lb (83.9 kg)  07/20/21 198 lb 12.8 oz (90.2 kg)  01/05/21 191 lb (86.6 kg)    Physical Exam Vitals and nursing note reviewed.  HENT:     Head: Normocephalic.     Right Ear: Hearing normal.     Left Ear: Hearing normal.     Nose: Nose normal.  Eyes:     Pupils: Pupils are equal, round, and reactive to light.  Pulmonary:     Effort: Pulmonary effort is normal. No respiratory distress.  Neurological:     Mental Status: She is alert.  Psychiatric:        Mood and Affect: Mood normal.        Behavior: Behavior normal.        Thought Content: Thought content normal.        Judgment: Judgment normal.    Results for orders placed or performed in visit on 01/05/21  Urine Culture   Specimen: Urine   UR  Result Value Ref Range   Urine Culture, Routine Final report    Organism ID, Bacteria Comment   Hepatitis C Antibody  Result Value Ref Range   Hep C Virus Ab <0.1 0.0 -  0.9 s/co ratio  CBC with Differential/Platelet  Result Value Ref Range   WBC 7.2 3.4 - 10.8 x10E3/uL   RBC 4.64 3.77 - 5.28 x10E6/uL   Hemoglobin 14.1 11.1 - 15.9 g/dL   Hematocrit 42.0 34.0 - 46.6 %   MCV 91 79 - 97 fL   MCH 30.4 26.6 - 33.0 pg   MCHC 33.6 31.5 - 35.7 g/dL   RDW 14.5 11.7 - 15.4 %   Platelets 332 150 - 450 x10E3/uL   Neutrophils 59 Not Estab. %   Lymphs 32 Not Estab. %   Monocytes 6 Not Estab. %   Eos 2 Not Estab. %   Basos 1 Not Estab. %   Neutrophils Absolute 4.2 1.4 - 7.0 x10E3/uL   Lymphocytes Absolute 2.3 0.7 - 3.1 x10E3/uL   Monocytes Absolute 0.4 0.1 - 0.9 x10E3/uL   EOS (ABSOLUTE) 0.2 0.0 - 0.4 x10E3/uL   Basophils Absolute 0.1 0.0 - 0.2 x10E3/uL   Immature Granulocytes 0 Not Estab. %   Immature Grans (Abs) 0.0 0.0 - 0.1 x10E3/uL  Comprehensive metabolic panel  Result Value Ref Range   Glucose 79 65 - 99 mg/dL   BUN 11 6 - 20 mg/dL   Creatinine, Ser 0.77 0.57 - 1.00 mg/dL   eGFR 101 >59  mL/min/1.73   BUN/Creatinine Ratio 14 9 - 23   Sodium 142 134 - 144 mmol/L   Potassium 4.2 3.5 - 5.2 mmol/L   Chloride 103 96 - 106 mmol/L   CO2 20 20 - 29 mmol/L   Calcium 9.6 8.7 - 10.2 mg/dL   Total Protein 7.0 6.0 - 8.5 g/dL   Albumin 4.5 3.8 - 4.8 g/dL   Globulin, Total 2.5 1.5 - 4.5 g/dL   Albumin/Globulin Ratio 1.8 1.2 - 2.2   Bilirubin Total 0.3 0.0 - 1.2 mg/dL   Alkaline Phosphatase 49 44 - 121 IU/L   AST 19 0 - 40 IU/L   ALT 22 0 - 32 IU/L  Lipid panel  Result Value Ref Range   Cholesterol, Total 275 (H) 100 - 199 mg/dL   Triglycerides 512 (H) 0 - 149 mg/dL   HDL 39 (L) >39 mg/dL   VLDL Cholesterol Cal 96 (H) 5 - 40 mg/dL   LDL Chol Calc (NIH) 140 (H) 0 - 99 mg/dL   Chol/HDL Ratio 7.1 (H) 0.0 - 4.4 ratio  TSH  Result Value Ref Range   TSH 1.280 0.450 - 4.500 uIU/mL  Urinalysis, Routine w reflex microscopic  Result Value Ref Range   Specific Gravity, UA >1.030 (H) 1.005 - 1.030   pH, UA 5.0 5.0 - 7.5   Color, UA Yellow Yellow   Appearance Ur Cloudy (A) Clear   Leukocytes,UA Negative Negative   Protein,UA Negative Negative/Trace   Glucose, UA Negative Negative   Ketones, UA Negative Negative   RBC, UA Negative Negative   Bilirubin, UA Negative Negative   Urobilinogen, Ur 0.2 0.2 - 1.0 mg/dL   Nitrite, UA Negative Negative  Cytology - PAP  Result Value Ref Range   High risk HPV Negative    Adequacy      Satisfactory for evaluation; transformation zone component PRESENT.   Diagnosis      - Negative for intraepithelial lesion or malignancy (NILM)   Comment Normal Reference Range HPV - Negative       Assessment & Plan:   Problem List Items Addressed This Visit       Other   Adjustment disorder with mixed anxiety and  depressed mood - Primary    Chronic.  Uncontrolled.  Will try Wellbutrin 142m daily.  Discussed side effects and benefits of medication with patient during visit.  Return to clinic in 2 weeks for reevaluation.  Call sooner if concerns arise.           Follow up plan: Return in about 2 weeks (around 09/22/2021) for Depression/Anxiety FU.   This visit was completed via MyChart due to the restrictions of the COVID-19 pandemic. All issues as above were discussed and addressed. Physical exam was done as above through visual confirmation on MyChart. If it was felt that the patient should be evaluated in the office, they were directed there. The patient verbally consented to this visit. Location of the patient: Home Location of the provider: Office Those involved with this call:  Provider: KJon Billings NP CMA: NA Front Desk/Registration: CMyrlene BrokerThis encounter was conducted via video.  I spent 20 dedicated to the care of this patient on the date of this encounter to include previsit review of 30, face to face time with the patient, and post visit ordering of testing.

## 2021-09-15 DIAGNOSIS — M5033 Other cervical disc degeneration, cervicothoracic region: Secondary | ICD-10-CM | POA: Diagnosis not present

## 2021-09-15 DIAGNOSIS — M9901 Segmental and somatic dysfunction of cervical region: Secondary | ICD-10-CM | POA: Diagnosis not present

## 2021-09-15 DIAGNOSIS — M5412 Radiculopathy, cervical region: Secondary | ICD-10-CM | POA: Diagnosis not present

## 2021-09-15 DIAGNOSIS — M9902 Segmental and somatic dysfunction of thoracic region: Secondary | ICD-10-CM | POA: Diagnosis not present

## 2021-09-16 DIAGNOSIS — M5412 Radiculopathy, cervical region: Secondary | ICD-10-CM | POA: Diagnosis not present

## 2021-09-16 DIAGNOSIS — M5033 Other cervical disc degeneration, cervicothoracic region: Secondary | ICD-10-CM | POA: Diagnosis not present

## 2021-09-16 DIAGNOSIS — M9902 Segmental and somatic dysfunction of thoracic region: Secondary | ICD-10-CM | POA: Diagnosis not present

## 2021-09-16 DIAGNOSIS — M9901 Segmental and somatic dysfunction of cervical region: Secondary | ICD-10-CM | POA: Diagnosis not present

## 2021-09-17 DIAGNOSIS — M5033 Other cervical disc degeneration, cervicothoracic region: Secondary | ICD-10-CM | POA: Diagnosis not present

## 2021-09-17 DIAGNOSIS — M5412 Radiculopathy, cervical region: Secondary | ICD-10-CM | POA: Diagnosis not present

## 2021-09-17 DIAGNOSIS — M9902 Segmental and somatic dysfunction of thoracic region: Secondary | ICD-10-CM | POA: Diagnosis not present

## 2021-09-17 DIAGNOSIS — M9901 Segmental and somatic dysfunction of cervical region: Secondary | ICD-10-CM | POA: Diagnosis not present

## 2021-09-23 DIAGNOSIS — M9902 Segmental and somatic dysfunction of thoracic region: Secondary | ICD-10-CM | POA: Diagnosis not present

## 2021-09-23 DIAGNOSIS — M5033 Other cervical disc degeneration, cervicothoracic region: Secondary | ICD-10-CM | POA: Diagnosis not present

## 2021-09-23 DIAGNOSIS — M5412 Radiculopathy, cervical region: Secondary | ICD-10-CM | POA: Diagnosis not present

## 2021-09-23 DIAGNOSIS — M9901 Segmental and somatic dysfunction of cervical region: Secondary | ICD-10-CM | POA: Diagnosis not present

## 2021-09-24 ENCOUNTER — Encounter: Payer: Self-pay | Admitting: Nurse Practitioner

## 2021-09-24 ENCOUNTER — Telehealth (INDEPENDENT_AMBULATORY_CARE_PROVIDER_SITE_OTHER): Payer: 59 | Admitting: Nurse Practitioner

## 2021-09-24 DIAGNOSIS — F4323 Adjustment disorder with mixed anxiety and depressed mood: Secondary | ICD-10-CM | POA: Diagnosis not present

## 2021-09-24 MED ORDER — BUPROPION HCL ER (SR) 150 MG PO TB12: 150.0000 mg | ORAL_TABLET | Freq: Two times a day (BID) | ORAL | 0 refills | Status: DC

## 2021-09-24 NOTE — Progress Notes (Signed)
? ?There were no vitals taken for this visit.  ? ?Subjective:  ? ? Patient ID: Shannon Davidson, female    DOB: [birth year], 41 y.o.   MRN: [id number] ? ?HPI: ?Shannon Davidson is a 41 y.o. female ? ?Chief Complaint  ?Patient presents with  ? Anxiety  ? Depression  ? ?ANXIETY/DEPRESSION ?Patient states she said the Wellbutrin isn't causing her any side effects.  States the depression is still very present.  But then today is a better day but the depression is still there.  Patient states she is really hopeful that this is going to help her.   ? ? ?Flowsheet Row Video Visit from 09/24/2021 in [place]  ?PHQ-9 Total Score 11  ? ?  ? ?GAD 7 : Generalized Anxiety Score 09/24/2021 09/08/2021 08/04/2021  ?Nervous, Anxious, on Edge _0 ?Control/stop worrying _1 ?Worry too much - different things _2 ?Trouble relaxing _3 ?Restless 0 0 0  ?Easily annoyed or irritable 0 1 0  ?Afraid - awful might happen _4 ?Total GAD 7 Score _5 ?Anxiety Difficulty Extremely difficult Somewhat difficult Extremely difficult  ? ?Patient has had vaginal discomfort.  No odor, no discharge.  But she does have some itching.   ? ? ?Relevant past medical, surgical, family and social history reviewed and updated as indicated. Interim medical history since our last visit reviewed. ?Allergies and medications reviewed and updated. ? ?Review of Systems  ?Genitourinary:   ?     Vaginal itching  ?Psychiatric/Behavioral:  Positive for dysphoric mood. Negative for suicidal ideas. The patient is nervous/anxious.   ? ?Per HPI unless specifically indicated above ? ?   ?Objective:  ?  ?There were no vitals taken for this visit.  ?Wt Readings from Last 3 Encounters:  ?09/04/21 185 lb (83.9 kg)  ?07/20/21 198 lb 12.8 oz (90.2 kg)  ?01/05/21 191 lb (86.6 kg)  ?  ?Physical Exam ?Vitals and nursing note reviewed.  ?HENT:  ?   Head: Normocephalic.  ?   Right Ear: Hearing normal.  ?   Left Ear: Hearing normal.  ?   Nose: Nose normal.  ?Eyes:   ?   Pupils: Pupils are equal, round, and reactive to light.  ?Pulmonary:  ?   Effort: Pulmonary effort is normal. No respiratory distress.  ?Neurological:  ?   Mental Status: She is alert.  ?Psychiatric:     ?   Mood and Affect: Mood normal.     ?   Behavior: Behavior normal.     ?   Thought Content: Thought content normal.     ?   Judgment: Judgment normal.  ? ? ?Results for orders placed or performed in visit on 01/05/21  ?Urine Culture  ? Specimen: Urine  ? UR  ?Result Value Ref Range  ? Urine Culture, Routine Final report   ? Organism ID, Bacteria Comment   ?Hepatitis C Antibody  ?Result Value Ref Range  ? Hep C Virus Ab <0.1 0.0 - 0.9 s/co ratio  ?CBC with Differential/Platelet  ?Result Value Ref Range  ? WBC 7.2 3.4 - 10.8 x10E3/uL  ? RBC 4.64 3.77 - 5.28 x10E6/uL  ? Hemoglobin 14.1 11.1 - 15.9 g/dL  ? Hematocrit 42.0 34.0 - 46.6 %  ? MCV 91 79 - 97 fL  ? MCH 30.4 26.6 - 33.0 pg  ? MCHC 33.6 31.5 - 35.7 g/dL  ? RDW  14.5 11.7 - 15.4 %  ? Platelets 332 150 - 450 x10E3/uL  ? Neutrophils 59 Not Estab. %  ? Lymphs 32 Not Estab. %  ? Monocytes 6 Not Estab. %  ? Eos 2 Not Estab. %  ? Basos 1 Not Estab. %  ? Neutrophils Absolute 4.2 1.4 - 7.0 x10E3/uL  ? Lymphocytes Absolute 2.3 0.7 - 3.1 x10E3/uL  ? Monocytes Absolute 0.4 0.1 - 0.9 x10E3/uL  ? EOS (ABSOLUTE) 0.2 0.0 - 0.4 x10E3/uL  ? Basophils Absolute 0.1 0.0 - 0.2 x10E3/uL  ? Immature Granulocytes 0 Not Estab. %  ? Immature Grans (Abs) 0.0 0.0 - 0.1 x10E3/uL  ?Comprehensive metabolic panel  ?Result Value Ref Range  ? Glucose 79 65 - 99 mg/dL  ? BUN 11 6 - 20 mg/dL  ? Creatinine, Ser 0.77 0.57 - 1.00 mg/dL  ? eGFR 101 >59 mL/min/1.73  ? BUN/Creatinine Ratio 14 9 - 23  ? Sodium 142 134 - 144 mmol/L  ? Potassium 4.2 3.5 - 5.2 mmol/L  ? Chloride 103 96 - 106 mmol/L  ? CO2 20 20 - 29 mmol/L  ? Calcium 9.6 8.7 - 10.2 mg/dL  ? Total Protein 7.0 6.0 - 8.5 g/dL  ? Albumin 4.5 3.8 - 4.8 g/dL  ? Globulin, Total 2.5 1.5 - 4.5 g/dL  ? Albumin/Globulin Ratio 1.8 1.2 - 2.2  ?  Bilirubin Total 0.3 0.0 - 1.2 mg/dL  ? Alkaline Phosphatase 49 44 - 121 IU/L  ? AST 19 0 - 40 IU/L  ? ALT 22 0 - 32 IU/L  ?Lipid panel  ?Result Value Ref Range  ? Cholesterol, Total 275 (H) 100 - 199 mg/dL  ? Triglycerides 512 (H) 0 - 149 mg/dL  ? HDL 39 (L) >39 mg/dL  ? VLDL Cholesterol Cal 96 (H) 5 - 40 mg/dL  ? LDL Chol Calc (NIH) 140 (H) 0 - 99 mg/dL  ? Chol/HDL Ratio 7.1 (H) 0.0 - 4.4 ratio  ?TSH  ?Result Value Ref Range  ? TSH 1.280 0.450 - 4.500 uIU/mL  ?Urinalysis, Routine w reflex microscopic  ?Result Value Ref Range  ? Specific Gravity, UA >1.030 (H) 1.005 - 1.030  ? pH, UA 5.0 5.0 - 7.5  ? Color, UA Yellow Yellow  ? Appearance Ur Cloudy (A) Clear  ? Leukocytes,UA Negative Negative  ? Protein,UA Negative Negative/Trace  ? Glucose, UA Negative Negative  ? Ketones, UA Negative Negative  ? RBC, UA Negative Negative  ? Bilirubin, UA Negative Negative  ? Urobilinogen, Ur 0.2 0.2 - 1.0 mg/dL  ? Nitrite, UA Negative Negative  ?Cytology - PAP  ?Result Value Ref Range  ? High risk HPV Negative   ? Adequacy    ?  Satisfactory for evaluation; transformation zone component PRESENT.  ? Diagnosis    ?  - Negative for intraepithelial lesion or malignancy (NILM)  ? Comment Normal Reference Range HPV - Negative   ? ?   ?Assessment & Plan:  ? ?Problem List Items Addressed This Visit   ? ?  ? Other  ? Adjustment disorder with mixed anxiety and depressed mood - Primary  ?  Chronic. Improved but still not well controlled. Will increase Wellbutrin to 133m daily.  Medication sent to the pharmacy. If patient is tolerating medication well can increase to Wellbutrin 2076mafter two weeks.  Follow up in 5 weeks for reevaluation.   ?  ?  ?  ? ?Follow up plan: ?Return in about 5 weeks (around 10/29/2021) for Depression/Anxiety FU (virtual). ? ? ?  This visit was completed via MyChart due to the restrictions of the COVID-19 pandemic. All issues as above were discussed and addressed. Physical exam was done as above through visual  confirmation on MyChart. If it was felt that the patient should be evaluated in the office, they were directed there. The patient verbally consented to this visit. ?Location of the patient: Home ?Location of the provider: Office ?Those involved with this call:  ?Provider: Jon Billings, NP ?CMA: Yvonna Alanis, CMA ?Front Desk/Registration: Lynnell Catalan ?This encounter was conducted via video.  I spent 30 dedicated to the care of this patient on the date of this encounter to include previsit review of symptoms, plan of care, and follow up, face to face time with the patient, and post visit ordering of testing.  ? ? ? ? ? ?

## 2021-09-24 NOTE — Assessment & Plan Note (Signed)
Chronic. Improved but still not well controlled. Will increase Wellbutrin to '150mg'$  daily.  Medication sent to the pharmacy. If patient is tolerating medication well can increase to Wellbutrin '200mg'$  after two weeks.  Follow up in 5 weeks for reevaluation.   ?

## 2021-09-24 NOTE — Progress Notes (Signed)
Pt scheduled  

## 2021-09-26 DIAGNOSIS — M5412 Radiculopathy, cervical region: Secondary | ICD-10-CM | POA: Diagnosis not present

## 2021-09-26 DIAGNOSIS — M9902 Segmental and somatic dysfunction of thoracic region: Secondary | ICD-10-CM | POA: Diagnosis not present

## 2021-09-26 DIAGNOSIS — M9901 Segmental and somatic dysfunction of cervical region: Secondary | ICD-10-CM | POA: Diagnosis not present

## 2021-09-26 DIAGNOSIS — M5033 Other cervical disc degeneration, cervicothoracic region: Secondary | ICD-10-CM | POA: Diagnosis not present

## 2021-09-28 DIAGNOSIS — M9902 Segmental and somatic dysfunction of thoracic region: Secondary | ICD-10-CM | POA: Diagnosis not present

## 2021-09-28 DIAGNOSIS — M5033 Other cervical disc degeneration, cervicothoracic region: Secondary | ICD-10-CM | POA: Diagnosis not present

## 2021-09-28 DIAGNOSIS — M9901 Segmental and somatic dysfunction of cervical region: Secondary | ICD-10-CM | POA: Diagnosis not present

## 2021-09-28 DIAGNOSIS — M5412 Radiculopathy, cervical region: Secondary | ICD-10-CM | POA: Diagnosis not present

## 2021-09-29 DIAGNOSIS — M5412 Radiculopathy, cervical region: Secondary | ICD-10-CM | POA: Diagnosis not present

## 2021-09-29 DIAGNOSIS — M5033 Other cervical disc degeneration, cervicothoracic region: Secondary | ICD-10-CM | POA: Diagnosis not present

## 2021-09-29 DIAGNOSIS — M9902 Segmental and somatic dysfunction of thoracic region: Secondary | ICD-10-CM | POA: Diagnosis not present

## 2021-09-29 DIAGNOSIS — M9901 Segmental and somatic dysfunction of cervical region: Secondary | ICD-10-CM | POA: Diagnosis not present

## 2021-09-30 DIAGNOSIS — M5412 Radiculopathy, cervical region: Secondary | ICD-10-CM | POA: Diagnosis not present

## 2021-09-30 DIAGNOSIS — M9902 Segmental and somatic dysfunction of thoracic region: Secondary | ICD-10-CM | POA: Diagnosis not present

## 2021-09-30 DIAGNOSIS — M9901 Segmental and somatic dysfunction of cervical region: Secondary | ICD-10-CM | POA: Diagnosis not present

## 2021-09-30 DIAGNOSIS — M5033 Other cervical disc degeneration, cervicothoracic region: Secondary | ICD-10-CM | POA: Diagnosis not present

## 2021-10-02 ENCOUNTER — Other Ambulatory Visit: Payer: Self-pay | Admitting: Nurse Practitioner

## 2021-10-05 ENCOUNTER — Other Ambulatory Visit: Payer: 59

## 2021-10-05 DIAGNOSIS — M9902 Segmental and somatic dysfunction of thoracic region: Secondary | ICD-10-CM | POA: Diagnosis not present

## 2021-10-05 DIAGNOSIS — M5033 Other cervical disc degeneration, cervicothoracic region: Secondary | ICD-10-CM | POA: Diagnosis not present

## 2021-10-05 DIAGNOSIS — M5412 Radiculopathy, cervical region: Secondary | ICD-10-CM | POA: Diagnosis not present

## 2021-10-05 DIAGNOSIS — M9901 Segmental and somatic dysfunction of cervical region: Secondary | ICD-10-CM | POA: Diagnosis not present

## 2021-10-05 NOTE — Telephone Encounter (Signed)
Requested medications are due for refill today.  no ? ?Requested medications are on the active medications list.  no ? ?Last refill. 08/21/2021 ? ?Future visit scheduled.   yes ? ?Notes to clinic.  Medication was discontinued 09/04/2021. ? ? ? ?Requested Prescriptions  ?Pending Prescriptions Disp Refills  ? escitalopram (LEXAPRO) 5 MG tablet [Pharmacy Med Name: ESCITALOPRAM '5MG'$  TABLETS] 30 tablet 0  ?  Sig: TAKE 1 TABLET(5 MG) BY MOUTH DAILY  ?  ? Psychiatry:  Antidepressants - SSRI Passed - 10/02/2021  3:10 PM  ?  ?  Passed - Valid encounter within last 6 months  ?  Recent Outpatient Visits   ? ?      ? 1 week ago Adjustment disorder with mixed anxiety and depressed mood  ? [place], NP  ? 3 weeks ago Adjustment disorder with mixed anxiety and depressed mood  ? [place], NP  ? 2 months ago Adjustment disorder with mixed anxiety and depressed mood  ? [place], NP  ? 2 months ago Obesity (BMI 30.0-34.9)  ? [place], NP  ? 3 months ago Obesity (BMI 30.0-34.9)  ? [facility] Jon Billings, NP  ? ?  ?  ?Future Appointments   ? ?        ? In 3 weeks Jon Billings, NP [facility]  ? In 3 months Jon Billings, NP [facility], [place]  ? In 11 months Stoioff, Ronda Fairly, MD [place]  ? ?  ? ?  ?  ?  ?  ?

## 2021-10-06 ENCOUNTER — Other Ambulatory Visit: Payer: Self-pay

## 2021-10-06 ENCOUNTER — Other Ambulatory Visit: Payer: 59

## 2021-10-06 DIAGNOSIS — M5033 Other cervical disc degeneration, cervicothoracic region: Secondary | ICD-10-CM | POA: Diagnosis not present

## 2021-10-06 DIAGNOSIS — M9901 Segmental and somatic dysfunction of cervical region: Secondary | ICD-10-CM | POA: Diagnosis not present

## 2021-10-06 DIAGNOSIS — M9902 Segmental and somatic dysfunction of thoracic region: Secondary | ICD-10-CM | POA: Diagnosis not present

## 2021-10-06 DIAGNOSIS — M5412 Radiculopathy, cervical region: Secondary | ICD-10-CM | POA: Diagnosis not present

## 2021-10-06 DIAGNOSIS — N200 Calculus of kidney: Secondary | ICD-10-CM | POA: Diagnosis not present

## 2021-10-07 MED ORDER — BUPROPION HCL ER (SR) 200 MG PO TB12: 200.0000 mg | ORAL_TABLET | Freq: Every day | ORAL | 1 refills | Status: DC

## 2021-10-08 DIAGNOSIS — M9901 Segmental and somatic dysfunction of cervical region: Secondary | ICD-10-CM | POA: Diagnosis not present

## 2021-10-08 DIAGNOSIS — M5412 Radiculopathy, cervical region: Secondary | ICD-10-CM | POA: Diagnosis not present

## 2021-10-08 DIAGNOSIS — M5033 Other cervical disc degeneration, cervicothoracic region: Secondary | ICD-10-CM | POA: Diagnosis not present

## 2021-10-08 DIAGNOSIS — M9902 Segmental and somatic dysfunction of thoracic region: Secondary | ICD-10-CM | POA: Diagnosis not present

## 2021-10-15 DIAGNOSIS — M9901 Segmental and somatic dysfunction of cervical region: Secondary | ICD-10-CM | POA: Diagnosis not present

## 2021-10-15 DIAGNOSIS — M5033 Other cervical disc degeneration, cervicothoracic region: Secondary | ICD-10-CM | POA: Diagnosis not present

## 2021-10-15 DIAGNOSIS — M5412 Radiculopathy, cervical region: Secondary | ICD-10-CM | POA: Diagnosis not present

## 2021-10-15 DIAGNOSIS — M9902 Segmental and somatic dysfunction of thoracic region: Secondary | ICD-10-CM | POA: Diagnosis not present

## 2021-10-16 ENCOUNTER — Other Ambulatory Visit: Payer: Self-pay | Admitting: Nurse Practitioner

## 2021-10-16 NOTE — Telephone Encounter (Signed)
Requested medications are due for refill today.  no ? ?Requested medications are on the active medications list.  no ? ?Last refill. 09/08/2021 ? ?Future visit scheduled.   yes ? ?Notes to clinic.  This dosage was discontinued. ? ? ? ?Requested Prescriptions  ?Pending Prescriptions Disp Refills  ? buPROPion ER (WELLBUTRIN SR) 100 MG 12 hr tablet [Pharmacy Med Name: BUPROPION SR '100MG'$  TABLETS (12 H)] 30 tablet 0  ?  Sig: TAKE 1 TABLET(100 MG) BY MOUTH DAILY  ?  ? Psychiatry: Antidepressants - bupropion Failed - 10/16/2021 10:13 AM  ?  ?  Failed - Last BP in normal range  ?  BP Readings from Last 1 Encounters:  ?09/04/21 (!) 144/89  ?  ?  ?  ?  Passed - Cr in normal range and within 360 days  ?  Creatinine  ?Date Value Ref Range Status  ?04/25/2014 0.92 0.60 - 1.30 mg/dL Final  ? ?Creatinine, Ser  ?Date Value Ref Range Status  ?01/05/2021 0.77 0.57 - 1.00 mg/dL Final  ?  ?  ?  ?  Passed - AST in normal range and within 360 days  ?  AST  ?Date Value Ref Range Status  ?01/05/2021 19 0 - 40 IU/L Final  ? ?SGOT(AST)  ?Date Value Ref Range Status  ?04/25/2014 55 (H) 15 - 37 Unit/L Final  ?  ?  ?  ?  Passed - ALT in normal range and within 360 days  ?  ALT  ?Date Value Ref Range Status  ?01/05/2021 22 0 - 32 IU/L Final  ? ?SGPT (ALT)  ?Date Value Ref Range Status  ?04/25/2014 41 U/L Final  ?  Comment:  ?  14-63 ?NOTE: New Reference Range ?01/29/14 ?  ?  ?  ?  ?  Passed - Valid encounter within last 6 months  ?  Recent Outpatient Visits   ? ?      ? 3 weeks ago Adjustment disorder with mixed anxiety and depressed mood  ? [place], NP  ? 1 month ago Adjustment disorder with mixed anxiety and depressed mood  ? [place], NP  ? 2 months ago Adjustment disorder with mixed anxiety and depressed mood  ? [place], NP  ? 2 months ago Obesity (BMI 30.0-34.9)  ? [place], NP  ? 4 months ago Obesity (BMI  30.0-34.9)  ? [facility] Jon Billings, NP  ? ?  ?  ?Future Appointments   ? ?        ? In 1 week Jon Billings, NP [facility], [facility]  ? In 3 months Jon Billings, NP [facility], [place]  ? In 10 months Stoioff, Ronda Fairly, MD [place]  ? ?  ? ?  ?  ?  ?  ?

## 2021-10-20 ENCOUNTER — Other Ambulatory Visit: Payer: Self-pay | Admitting: Urology

## 2021-10-21 DIAGNOSIS — M9901 Segmental and somatic dysfunction of cervical region: Secondary | ICD-10-CM | POA: Diagnosis not present

## 2021-10-21 DIAGNOSIS — M9902 Segmental and somatic dysfunction of thoracic region: Secondary | ICD-10-CM | POA: Diagnosis not present

## 2021-10-21 DIAGNOSIS — M5412 Radiculopathy, cervical region: Secondary | ICD-10-CM | POA: Diagnosis not present

## 2021-10-21 DIAGNOSIS — M5033 Other cervical disc degeneration, cervicothoracic region: Secondary | ICD-10-CM | POA: Diagnosis not present

## 2021-10-22 DIAGNOSIS — M5033 Other cervical disc degeneration, cervicothoracic region: Secondary | ICD-10-CM | POA: Diagnosis not present

## 2021-10-22 DIAGNOSIS — M9901 Segmental and somatic dysfunction of cervical region: Secondary | ICD-10-CM | POA: Diagnosis not present

## 2021-10-22 DIAGNOSIS — M5412 Radiculopathy, cervical region: Secondary | ICD-10-CM | POA: Diagnosis not present

## 2021-10-22 DIAGNOSIS — M9902 Segmental and somatic dysfunction of thoracic region: Secondary | ICD-10-CM | POA: Diagnosis not present

## 2021-10-26 ENCOUNTER — Encounter: Payer: Self-pay | Admitting: Urology

## 2021-10-26 DIAGNOSIS — M9902 Segmental and somatic dysfunction of thoracic region: Secondary | ICD-10-CM | POA: Diagnosis not present

## 2021-10-26 DIAGNOSIS — M5412 Radiculopathy, cervical region: Secondary | ICD-10-CM | POA: Diagnosis not present

## 2021-10-26 DIAGNOSIS — M5033 Other cervical disc degeneration, cervicothoracic region: Secondary | ICD-10-CM | POA: Diagnosis not present

## 2021-10-26 DIAGNOSIS — M9901 Segmental and somatic dysfunction of cervical region: Secondary | ICD-10-CM | POA: Diagnosis not present

## 2021-10-28 NOTE — Progress Notes (Signed)
? ?LMP 10/29/2021   ? ?Subjective:  ? ? Patient ID: Shannon Davidson, female    DOB: [birth year], 41 y.o.   MRN: [id number] ? ?HPI: ?Shannon Davidson is a 41 y.o. female ? ?Chief Complaint  ?Patient presents with  ? Anxiety  ? Depression  ?  Pt requesting refill on Xanax prescription.   ? ?ANXIETY/DEPRESSION ?Patient states things are going better. She feels like the medication is working well.  She states she wasn't sure at first but she overall feels a lot better.  The depression she said the Wellbutrin isn't causing her any side effects.  States the depression is still there but not as bad.   ? ? ?Flowsheet Row Video Visit from 10/29/2021 in [place]  ?PHQ-9 Total Score 4  ? ?  ? ? ?  10/29/2021  ? 11:14 AM 09/24/2021  ? 11:18 AM 09/08/2021  ? 11:20 AM 08/04/2021  ? 11:18 AM  ?GAD 7 : Generalized Anxiety Score  ?Nervous, Anxious, on Edge 1 1 1 1   ?Control/stop worrying 0 3 2 1   ?Worry too much - different things 1 3 2 1   ?Trouble relaxing 2 3 2 1   ?Restless 0 0 0 0  ?Easily annoyed or irritable 1 0 1 0  ?Afraid - awful might happen 0 1 1 1   ?Total GAD 7 Score 5 11 9 5   ?Anxiety Difficulty Not difficult at all Extremely difficult Somewhat difficult Extremely difficult  ? ? ? ?Relevant past medical, surgical, family and social history reviewed and updated as indicated. Interim medical history since our last visit reviewed. ?Allergies and medications reviewed and updated. ? ?Review of Systems  ?Genitourinary:   ?     Vaginal itching  ?Psychiatric/Behavioral:  Positive for dysphoric mood. Negative for suicidal ideas. The patient is nervous/anxious.   ? ?Per HPI unless specifically indicated above ? ?   ?Objective:  ?  ?LMP 10/29/2021   ?Wt Readings from Last 3 Encounters:  ?09/04/21 185 lb (83.9 kg)  ?07/20/21 198 lb 12.8 oz (90.2 kg)  ?01/05/21 191 lb (86.6 kg)  ?  ?Physical Exam ?Vitals and nursing note reviewed.  ?HENT:  ?   Head: Normocephalic.  ?   Right Ear: Hearing normal.  ?   Left Ear: Hearing normal.  ?    Nose: Nose normal.  ?Eyes:  ?   Pupils: Pupils are equal, round, and reactive to light.  ?Pulmonary:  ?   Effort: Pulmonary effort is normal. No respiratory distress.  ?Neurological:  ?   Mental Status: She is alert.  ?Psychiatric:     ?   Mood and Affect: Mood normal.     ?   Behavior: Behavior normal.     ?   Thought Content: Thought content normal.     ?   Judgment: Judgment normal.  ? ? ?Results for orders placed or performed in visit on 01/05/21  ?Urine Culture  ? Specimen: Urine  ? UR  ?Result Value Ref Range  ? Urine Culture, Routine Final report   ? Organism ID, Bacteria Comment   ?Hepatitis C Antibody  ?Result Value Ref Range  ? Hep C Virus Ab <0.1 0.0 - 0.9 s/co ratio  ?CBC with Differential/Platelet  ?Result Value Ref Range  ? WBC 7.2 3.4 - 10.8 x10E3/uL  ? RBC 4.64 3.77 - 5.28 x10E6/uL  ? Hemoglobin 14.1 11.1 - 15.9 g/dL  ? Hematocrit 42.0 34.0 - 46.6 %  ? MCV 91 79 - 97 fL  ?  MCH 30.4 26.6 - 33.0 pg  ? MCHC 33.6 31.5 - 35.7 g/dL  ? RDW 14.5 11.7 - 15.4 %  ? Platelets 332 150 - 450 x10E3/uL  ? Neutrophils 59 Not Estab. %  ? Lymphs 32 Not Estab. %  ? Monocytes 6 Not Estab. %  ? Eos 2 Not Estab. %  ? Basos 1 Not Estab. %  ? Neutrophils Absolute 4.2 1.4 - 7.0 x10E3/uL  ? Lymphocytes Absolute 2.3 0.7 - 3.1 x10E3/uL  ? Monocytes Absolute 0.4 0.1 - 0.9 x10E3/uL  ? EOS (ABSOLUTE) 0.2 0.0 - 0.4 x10E3/uL  ? Basophils Absolute 0.1 0.0 - 0.2 x10E3/uL  ? Immature Granulocytes 0 Not Estab. %  ? Immature Grans (Abs) 0.0 0.0 - 0.1 x10E3/uL  ?Comprehensive metabolic panel  ?Result Value Ref Range  ? Glucose 79 65 - 99 mg/dL  ? BUN 11 6 - 20 mg/dL  ? Creatinine, Ser 0.77 0.57 - 1.00 mg/dL  ? eGFR 101 >59 mL/min/1.73  ? BUN/Creatinine Ratio 14 9 - 23  ? Sodium 142 134 - 144 mmol/L  ? Potassium 4.2 3.5 - 5.2 mmol/L  ? Chloride 103 96 - 106 mmol/L  ? CO2 20 20 - 29 mmol/L  ? Calcium 9.6 8.7 - 10.2 mg/dL  ? Total Protein 7.0 6.0 - 8.5 g/dL  ? Albumin 4.5 3.8 - 4.8 g/dL  ? Globulin, Total 2.5 1.5 - 4.5 g/dL  ?  Albumin/Globulin Ratio 1.8 1.2 - 2.2  ? Bilirubin Total 0.3 0.0 - 1.2 mg/dL  ? Alkaline Phosphatase 49 44 - 121 IU/L  ? AST 19 0 - 40 IU/L  ? ALT 22 0 - 32 IU/L  ?Lipid panel  ?Result Value Ref Range  ? Cholesterol, Total 275 (H) 100 - 199 mg/dL  ? Triglycerides 512 (H) 0 - 149 mg/dL  ? HDL 39 (L) >39 mg/dL  ? VLDL Cholesterol Cal 96 (H) 5 - 40 mg/dL  ? LDL Chol Calc (NIH) 140 (H) 0 - 99 mg/dL  ? Chol/HDL Ratio 7.1 (H) 0.0 - 4.4 ratio  ?TSH  ?Result Value Ref Range  ? TSH 1.280 0.450 - 4.500 uIU/mL  ?Urinalysis, Routine w reflex microscopic  ?Result Value Ref Range  ? Specific Gravity, UA >1.030 (H) 1.005 - 1.030  ? pH, UA 5.0 5.0 - 7.5  ? Color, UA Yellow Yellow  ? Appearance Ur Cloudy (A) Clear  ? Leukocytes,UA Negative Negative  ? Protein,UA Negative Negative/Trace  ? Glucose, UA Negative Negative  ? Ketones, UA Negative Negative  ? RBC, UA Negative Negative  ? Bilirubin, UA Negative Negative  ? Urobilinogen, Ur 0.2 0.2 - 1.0 mg/dL  ? Nitrite, UA Negative Negative  ?Cytology - PAP  ?Result Value Ref Range  ? High risk HPV Negative   ? Adequacy    ?  Satisfactory for evaluation; transformation zone component PRESENT.  ? Diagnosis    ?  - Negative for intraepithelial lesion or malignancy (NILM)  ? Comment Normal Reference Range HPV - Negative   ? ?   ?Assessment & Plan:  ? ?Problem List Items Addressed This Visit   ? ?  ? Other  ? Adjustment disorder with mixed anxiety and depressed mood - Primary  ?  ? ?Follow up plan: ?No follow-ups on file. ? ? ?This visit was completed via MyChart due to the restrictions of the COVID-19 pandemic. All issues as above were discussed and addressed. Physical exam was done as above through visual confirmation on MyChart. If it was felt  that the patient should be evaluated in the office, they were directed there. The patient verbally consented to this visit. ?Location of the patient: Home ?Location of the provider: Office ?Those involved with this call:  ?Provider: Jon Billings,  NP ?CMA: Valinda Hoar, CMA ?Front Desk/Registration: Lynnell Catalan ?This encounter was conducted via video.  I spent 20 dedicated to the care of this patient on the date of this encounter to include previsit review of symptoms, plan of care, and follow up, face to face time with the patient, and post visit ordering of testing.  ? ? ? ? ? ?

## 2021-10-29 ENCOUNTER — Encounter: Payer: Self-pay | Admitting: Nurse Practitioner

## 2021-10-29 ENCOUNTER — Telehealth (INDEPENDENT_AMBULATORY_CARE_PROVIDER_SITE_OTHER): Payer: 59 | Admitting: Nurse Practitioner

## 2021-10-29 DIAGNOSIS — M5033 Other cervical disc degeneration, cervicothoracic region: Secondary | ICD-10-CM | POA: Diagnosis not present

## 2021-10-29 DIAGNOSIS — F4323 Adjustment disorder with mixed anxiety and depressed mood: Secondary | ICD-10-CM | POA: Diagnosis not present

## 2021-10-29 DIAGNOSIS — M9902 Segmental and somatic dysfunction of thoracic region: Secondary | ICD-10-CM | POA: Diagnosis not present

## 2021-10-29 DIAGNOSIS — M5412 Radiculopathy, cervical region: Secondary | ICD-10-CM | POA: Diagnosis not present

## 2021-10-29 DIAGNOSIS — M9901 Segmental and somatic dysfunction of cervical region: Secondary | ICD-10-CM | POA: Diagnosis not present

## 2021-10-29 MED ORDER — ALPRAZOLAM 0.25 MG PO TABS: 0.2500 mg | ORAL_TABLET | Freq: Two times a day (BID) | ORAL | 0 refills | Status: DC | PRN

## 2021-10-29 MED ORDER — BUPROPION HCL ER (SR) 200 MG PO TB12: 200.0000 mg | ORAL_TABLET | Freq: Every day | ORAL | 1 refills | Status: DC

## 2021-10-29 NOTE — Assessment & Plan Note (Signed)
Chronic.  Controlled.  Continue with current medication regimen of Wellbutrin '200mg'$  daily and Xanax 0.'25mg'$  PRN.  Will refill today.  1 refill should last about 3 months.  Return to clinic in 3 months for reevaluation.  Call sooner if concerns arise.  ? ?

## 2021-11-03 DIAGNOSIS — M5033 Other cervical disc degeneration, cervicothoracic region: Secondary | ICD-10-CM | POA: Diagnosis not present

## 2021-11-03 DIAGNOSIS — M5412 Radiculopathy, cervical region: Secondary | ICD-10-CM | POA: Diagnosis not present

## 2021-11-03 DIAGNOSIS — M9901 Segmental and somatic dysfunction of cervical region: Secondary | ICD-10-CM | POA: Diagnosis not present

## 2021-11-03 DIAGNOSIS — M9902 Segmental and somatic dysfunction of thoracic region: Secondary | ICD-10-CM | POA: Diagnosis not present

## 2021-11-05 DIAGNOSIS — M9901 Segmental and somatic dysfunction of cervical region: Secondary | ICD-10-CM | POA: Diagnosis not present

## 2021-11-05 DIAGNOSIS — M5412 Radiculopathy, cervical region: Secondary | ICD-10-CM | POA: Diagnosis not present

## 2021-11-05 DIAGNOSIS — M5033 Other cervical disc degeneration, cervicothoracic region: Secondary | ICD-10-CM | POA: Diagnosis not present

## 2021-11-05 DIAGNOSIS — M9902 Segmental and somatic dysfunction of thoracic region: Secondary | ICD-10-CM | POA: Diagnosis not present

## 2021-11-10 ENCOUNTER — Encounter: Payer: Self-pay | Admitting: Nurse Practitioner

## 2021-11-10 DIAGNOSIS — M5412 Radiculopathy, cervical region: Secondary | ICD-10-CM | POA: Diagnosis not present

## 2021-11-10 DIAGNOSIS — M9901 Segmental and somatic dysfunction of cervical region: Secondary | ICD-10-CM | POA: Diagnosis not present

## 2021-11-10 DIAGNOSIS — M5033 Other cervical disc degeneration, cervicothoracic region: Secondary | ICD-10-CM | POA: Diagnosis not present

## 2021-11-10 DIAGNOSIS — M9902 Segmental and somatic dysfunction of thoracic region: Secondary | ICD-10-CM | POA: Diagnosis not present

## 2021-11-19 DIAGNOSIS — M5412 Radiculopathy, cervical region: Secondary | ICD-10-CM | POA: Diagnosis not present

## 2021-11-19 DIAGNOSIS — M5033 Other cervical disc degeneration, cervicothoracic region: Secondary | ICD-10-CM | POA: Diagnosis not present

## 2021-11-19 DIAGNOSIS — M9902 Segmental and somatic dysfunction of thoracic region: Secondary | ICD-10-CM | POA: Diagnosis not present

## 2021-11-19 DIAGNOSIS — M9901 Segmental and somatic dysfunction of cervical region: Secondary | ICD-10-CM | POA: Diagnosis not present

## 2021-11-24 DIAGNOSIS — M9901 Segmental and somatic dysfunction of cervical region: Secondary | ICD-10-CM | POA: Diagnosis not present

## 2021-11-24 DIAGNOSIS — M9902 Segmental and somatic dysfunction of thoracic region: Secondary | ICD-10-CM | POA: Diagnosis not present

## 2021-11-24 DIAGNOSIS — M5412 Radiculopathy, cervical region: Secondary | ICD-10-CM | POA: Diagnosis not present

## 2021-11-24 DIAGNOSIS — M5033 Other cervical disc degeneration, cervicothoracic region: Secondary | ICD-10-CM | POA: Diagnosis not present

## 2021-12-03 DIAGNOSIS — M9902 Segmental and somatic dysfunction of thoracic region: Secondary | ICD-10-CM | POA: Diagnosis not present

## 2021-12-03 DIAGNOSIS — M5412 Radiculopathy, cervical region: Secondary | ICD-10-CM | POA: Diagnosis not present

## 2021-12-03 DIAGNOSIS — M5033 Other cervical disc degeneration, cervicothoracic region: Secondary | ICD-10-CM | POA: Diagnosis not present

## 2021-12-03 DIAGNOSIS — M9901 Segmental and somatic dysfunction of cervical region: Secondary | ICD-10-CM | POA: Diagnosis not present

## 2021-12-31 DIAGNOSIS — M5412 Radiculopathy, cervical region: Secondary | ICD-10-CM | POA: Diagnosis not present

## 2021-12-31 DIAGNOSIS — M9901 Segmental and somatic dysfunction of cervical region: Secondary | ICD-10-CM | POA: Diagnosis not present

## 2021-12-31 DIAGNOSIS — M5033 Other cervical disc degeneration, cervicothoracic region: Secondary | ICD-10-CM | POA: Diagnosis not present

## 2021-12-31 DIAGNOSIS — M9902 Segmental and somatic dysfunction of thoracic region: Secondary | ICD-10-CM | POA: Diagnosis not present

## 2022-01-14 DIAGNOSIS — M5412 Radiculopathy, cervical region: Secondary | ICD-10-CM | POA: Diagnosis not present

## 2022-01-14 DIAGNOSIS — M9901 Segmental and somatic dysfunction of cervical region: Secondary | ICD-10-CM | POA: Diagnosis not present

## 2022-01-14 DIAGNOSIS — M9902 Segmental and somatic dysfunction of thoracic region: Secondary | ICD-10-CM | POA: Diagnosis not present

## 2022-01-14 DIAGNOSIS — M5033 Other cervical disc degeneration, cervicothoracic region: Secondary | ICD-10-CM | POA: Diagnosis not present

## 2022-01-19 NOTE — Progress Notes (Signed)
BP 125/78   Pulse 68   Temp 99.1 F (37.3 C) (Oral)   Ht 5' 5.35" (1.66 m)   Wt 184 lb 3.2 oz (83.6 kg)   LMP 01/13/2022 (Exact Date)   SpO2 99%   BMI 30.32 kg/m    Subjective:    Patient ID: Shannon Davidson, female    DOB: 1981/03/18, 41 y.o.   MRN: [id number]  HPI: Shannon Davidson is a 41 y.o. female presenting on 01/20/2022 for comprehensive medical examination. Current medical complaints include: Continues to have fatigue and abnormal menses  due to PCOS and MS.  She is seeing Dr. Arlyn Dunning in [place] for the Lyme which has helped with all her MS symptoms as well.   She currently lives with: Menopausal Symptoms: no  HYPERLIPIDEMIA Hyperlipidemia status: excellent compliance Satisfied with current treatment?  no Side effects:  no Medication compliance:  not currently taking Past cholesterol meds: none Supplements: none Aspirin:  no The ASCVD Risk score (Arnett DK, et al., 2019) failed to calculate for the following reasons:   The 2019 ASCVD risk score is only valid for ages 42 to 72 Chest pain:  no Coronary artery disease:  no Family history CAD:  no Family history early CAD:  no   MOOD Patient states she feels like her mood is more balanced.  She does have some "blues days". She is still has break through anxiety.  She is also still in therapy monthly.    Depression Screen done today and results listed below:     01/20/2022    8:02 AM 10/29/2021   11:11 AM 09/24/2021   11:16 AM 09/08/2021   11:17 AM 08/04/2021   11:16 AM  Depression screen PHQ 2/9  Decreased Interest 0 1 1 2 3   Down, Depressed, Hopeless 0 1 1 2 1   PHQ - 2 Score 0 2 2 4 4   Altered sleeping 0 0 3 1 3   Tired, decreased energy 1 1 3 2 3   Change in appetite 0 0 0 0 0  Feeling bad or failure about yourself  0 0 2 3 1   Trouble concentrating 1 1 1 3 3   Moving slowly or fidgety/restless 0 0 0 0 0  Suicidal thoughts 0 0 0 0   PHQ-9 Score 2 4 11 13 14   Difficult doing work/chores Somewhat difficult Somewhat  difficult Extremely dIfficult Very difficult Extremely dIfficult    The patient does not have a history of falls. I did complete a risk assessment for falls. A plan of care for falls was documented.   Past Medical History:  Past Medical History:  Diagnosis Date   Babesiasis    Bartonella infection    Elevated EBV antibody titer    History of kidney stones    HLD (hyperlipidemia)    Kidney stone    Dr. Lonna Cobb   Lyme disease    Palpitations    Pap smear for cervical cancer screening 2012   Dr. Juliene Pina   PCOS (polycystic ovarian syndrome)    Relapsing remitting multiple sclerosis (HCC) 11/25/2014   Patient says that all her MS symptoms went away in 2017-2018 after she was treated for tick-bourne illnesses    Surgical History:  Past Surgical History:  Procedure Laterality Date   APPENDECTOMY  09/2019   CESAREAN SECTION  2008   CYSTOSCOPY/URETEROSCOPY/HOLMIUM LASER/STENT PLACEMENT Left 12/02/2020   Procedure: CYSTOSCOPY/URETEROSCOPY/HOLMIUM LASER/STENT EXCHANGE;  Surgeon: Riki Altes, MD;  Location: [facility];  Service: Urology;  Laterality: Left;  LITHOTRIPSY     TONSILLECTOMY N/A 05/20/2016   Procedure: TONSILLECTOMY;  Surgeon: Vernie Murders, MD;  [facility];  Service: ENT;  Laterality: N/A;   TONSILLECTOMY      Medications:  Current Outpatient Medications on File Prior to Visit  Medication Sig   ALPRAZolam (XANAX) 0.25 MG tablet Take 1 tablet (0.25 mg total) by mouth 2 (two) times daily as needed for anxiety.   No current facility-administered medications on file prior to visit.    Allergies:  Allergies  Allergen Reactions   Egg White (Egg Protein) Swelling   Influenza Vaccines Swelling    Social History:  Social History   Socioeconomic History   Marital status: Single    Spouse name: Not on file   Number of children: 1   Years of education: Not on file   Highest education level: Not on file  Occupational History   Occupation: Journalist, newspaper  GI    Employer: [employer]  Tobacco Use   Smoking status: Never   Smokeless tobacco: Never  Vaping Use   Vaping Use: Never used  Substance and Sexual Activity   Alcohol use: Yes    Comment: social - maybe 3 glasses of wine/month    Drug use: No   Sexual activity: Not Currently    Birth control/protection: None, Condom  Other Topics Concern   Not on file  Social History Narrative   Has one child.      Single, full time RN.    Social Determinants of Health   Financial Resource Strain: Not on file  Food Insecurity: Not on file  Transportation Needs: Not on file  Physical Activity: Not on file  Stress: Not on file  Social Connections: Not on file  Intimate Partner Violence: Not on file   Social History   Tobacco Use  Smoking Status Never  Smokeless Tobacco Never   Social History   Substance and Sexual Activity  Alcohol Use Yes   Comment: social - maybe 3 glasses of wine/month     Family History:  Family History  Problem Relation Age of Onset   Heart disease Maternal Grandmother 82       s/p CABG   Dementia Maternal Grandmother    Hypertension Mother    Seizures Father    Hyperlipidemia Father    Achalasia Sister    COPD Maternal Grandfather    Diabetes Paternal Grandmother     Past medical history, surgical history, medications, allergies, family history and social history reviewed with patient today and changes made to appropriate areas of the chart.   Review of Systems  Constitutional:  Positive for malaise/fatigue.  Gastrointestinal:        Abnormal menses   All other ROS negative except what is listed above and in the HPI.      Objective:    BP 125/78   Pulse 68   Temp 99.1 F (37.3 C) (Oral)   Ht 5' 5.35" (1.66 m)   Wt 184 lb 3.2 oz (83.6 kg)   LMP 01/13/2022 (Exact Date)   SpO2 99%   BMI 30.32 kg/m   Wt Readings from Last 3 Encounters:  01/20/22 184 lb 3.2 oz (83.6 kg)  09/04/21 185 lb (83.9 kg)  07/20/21 198 lb 12.8 oz (90.2 kg)     Physical Exam Vitals and nursing note reviewed.  Constitutional:      General: She is awake. She is not in acute distress.    Appearance: Normal appearance. She is well-developed  and normal weight. She is not ill-appearing.  HENT:     Head: Normocephalic and atraumatic.     Right Ear: Hearing, tympanic membrane, ear canal and external ear normal. No drainage.     Left Ear: Hearing, tympanic membrane, ear canal and external ear normal. No drainage.     Nose: Nose normal.     Right Sinus: No maxillary sinus tenderness or frontal sinus tenderness.     Left Sinus: No maxillary sinus tenderness or frontal sinus tenderness.     Mouth/Throat:     Mouth: Mucous membranes are moist.     Pharynx: Oropharynx is clear. Uvula midline. No pharyngeal swelling, oropharyngeal exudate or posterior oropharyngeal erythema.  Eyes:     General: Lids are normal.        Right eye: No discharge.        Left eye: No discharge.     Extraocular Movements: Extraocular movements intact.     Conjunctiva/sclera: Conjunctivae normal.     Pupils: Pupils are equal, round, and reactive to light.     Visual Fields: Right eye visual fields normal and left eye visual fields normal.  Neck:     Thyroid: No thyromegaly.     Vascular: No carotid bruit.     Trachea: Trachea normal.  Cardiovascular:     Rate and Rhythm: Normal rate and regular rhythm.     Heart sounds: Normal heart sounds. No murmur heard.    No gallop.  Pulmonary:     Effort: Pulmonary effort is normal. No accessory muscle usage or respiratory distress.     Breath sounds: Normal breath sounds.  Chest:  Breasts:    Right: Normal.     Left: Normal.  Abdominal:     General: Bowel sounds are normal.     Palpations: Abdomen is soft. There is no hepatomegaly or splenomegaly.     Tenderness: There is no abdominal tenderness.  Musculoskeletal:        General: Normal range of motion.     Cervical back: Normal range of motion and neck supple.     Right  lower leg: No edema.     Left lower leg: No edema.  Lymphadenopathy:     Head:     Right side of head: No submental, submandibular, tonsillar, preauricular or posterior auricular adenopathy.     Left side of head: No submental, submandibular, tonsillar, preauricular or posterior auricular adenopathy.     Cervical: No cervical adenopathy.     Upper Body:     Right upper body: No supraclavicular, axillary or pectoral adenopathy.     Left upper body: No supraclavicular, axillary or pectoral adenopathy.  Skin:    General: Skin is warm and dry.     Capillary Refill: Capillary refill takes less than 2 seconds.     Findings: No rash.  Neurological:     Mental Status: She is alert and oriented to person, place, and time.     Gait: Gait is intact.     Deep Tendon Reflexes: Reflexes are normal and symmetric.     Reflex Scores:      Brachioradialis reflexes are 2+ on the right side and 2+ on the left side.      Patellar reflexes are 2+ on the right side and 2+ on the left side. Psychiatric:        Attention and Perception: Attention normal.        Mood and Affect: Mood normal.  Speech: Speech normal.        Behavior: Behavior normal. Behavior is cooperative.        Thought Content: Thought content normal.        Judgment: Judgment normal.     Results for orders placed or performed in visit on 01/05/21  Urine Culture   Specimen: Urine   UR  Result Value Ref Range   Urine Culture, Routine Final report    Organism ID, Bacteria Comment   Hepatitis C Antibody  Result Value Ref Range   Hep C Virus Ab <0.1 0.0 - 0.9 s/co ratio  CBC with Differential/Platelet  Result Value Ref Range   WBC 7.2 3.4 - 10.8 x10E3/uL   RBC 4.64 3.77 - 5.28 x10E6/uL   Hemoglobin 14.1 11.1 - 15.9 g/dL   Hematocrit 40.9 81.1 - 46.6 %   MCV 91 79 - 97 fL   MCH 30.4 26.6 - 33.0 pg   MCHC 33.6 31.5 - 35.7 g/dL   RDW 91.4 78.2 - 95.6 %   Platelets 332 150 - 450 x10E3/uL   Neutrophils 59 Not Estab. %    Lymphs 32 Not Estab. %   Monocytes 6 Not Estab. %   Eos 2 Not Estab. %   Basos 1 Not Estab. %   Neutrophils Absolute 4.2 1.4 - 7.0 x10E3/uL   Lymphocytes Absolute 2.3 0.7 - 3.1 x10E3/uL   Monocytes Absolute 0.4 0.1 - 0.9 x10E3/uL   EOS (ABSOLUTE) 0.2 0.0 - 0.4 x10E3/uL   Basophils Absolute 0.1 0.0 - 0.2 x10E3/uL   Immature Granulocytes 0 Not Estab. %   Immature Grans (Abs) 0.0 0.0 - 0.1 x10E3/uL  Comprehensive metabolic panel  Result Value Ref Range   Glucose 79 65 - 99 mg/dL   BUN 11 6 - 20 mg/dL   Creatinine, Ser 2.13 0.57 - 1.00 mg/dL   eGFR 086 >57 QI/ONG/2.95   BUN/Creatinine Ratio 14 9 - 23   Sodium 142 134 - 144 mmol/L   Potassium 4.2 3.5 - 5.2 mmol/L   Chloride 103 96 - 106 mmol/L   CO2 20 20 - 29 mmol/L   Calcium 9.6 8.7 - 10.2 mg/dL   Total Protein 7.0 6.0 - 8.5 g/dL   Albumin 4.5 3.8 - 4.8 g/dL   Globulin, Total 2.5 1.5 - 4.5 g/dL   Albumin/Globulin Ratio 1.8 1.2 - 2.2   Bilirubin Total 0.3 0.0 - 1.2 mg/dL   Alkaline Phosphatase 49 44 - 121 IU/L   AST 19 0 - 40 IU/L   ALT 22 0 - 32 IU/L  Lipid panel  Result Value Ref Range   Cholesterol, Total 275 (H) 100 - 199 mg/dL   Triglycerides 284 (H) 0 - 149 mg/dL   HDL 39 (L) >13 mg/dL   VLDL Cholesterol Cal 96 (H) 5 - 40 mg/dL   LDL Chol Calc (NIH) 244 (H) 0 - 99 mg/dL   Chol/HDL Ratio 7.1 (H) 0.0 - 4.4 ratio  TSH  Result Value Ref Range   TSH 1.280 0.450 - 4.500 uIU/mL  Urinalysis, Routine w reflex microscopic  Result Value Ref Range   Specific Gravity, UA >1.030 (H) 1.005 - 1.030   pH, UA 5.0 5.0 - 7.5   Color, UA Yellow Yellow   Appearance Ur Cloudy (A) Clear   Leukocytes,UA Negative Negative   Protein,UA Negative Negative/Trace   Glucose, UA Negative Negative   Ketones, UA Negative Negative   RBC, UA Negative Negative   Bilirubin, UA Negative Negative   Urobilinogen,  Ur 0.2 0.2 - 1.0 mg/dL   Nitrite, UA Negative Negative  Cytology - PAP  Result Value Ref Range   High risk HPV Negative    Adequacy       Satisfactory for evaluation; transformation zone component PRESENT.   Diagnosis      - Negative for intraepithelial lesion or malignancy (NILM)   Comment Normal Reference Range HPV - Negative       Assessment & Plan:   Problem List Items Addressed This Visit       Endocrine   PCOS (polycystic ovarian syndrome)    Chronic. Ongoing.  Patient is requesting hormone labs to check on her PCOS.  Will order labs today. Will make recommendations based on lab results.       Relevant Orders   Vitamin D (25 hydroxy)   B12   Estrogens, Total   Progesterone   FSH/LH   Thyroid Panel With TSH   Thyroid peroxidase antibody     Nervous and Auditory   Relapsing remitting multiple sclerosis (HCC)    Chronic.  Controlled.  Follow by Dr. In Leane Call for MS and Lyme disease.  Labs ordered today.  Return to clinic in 6 months for reevaluation.  Call sooner if concerns arise.        Relevant Orders   Vitamin D (25 hydroxy)   B12   Estrogens, Total   Progesterone   FSH/LH   Thyroid Panel With TSH   Thyroid peroxidase antibody     Other   Hyperlipidemia    Chronic.  Controlled with diet.  Labs ordered today.  Return to clinic in 6 months for reevaluation.  Call sooner if concerns arise.        Relevant Orders   Lipid panel   Adjustment disorder with mixed anxiety and depressed mood    Chronic. Improved from prior.  Continues with her therapy regularly.  Would like to continue the PRN Xanax.  Has used 1 in the last 3 months.  Discussed with her that I can give her 2 refills a year.  Patient currently still has some.  Will refill when she needs it.   Follow up in 6 months for reevaluation.  Call sooner if concerns arise.       Chronic fatigue    Chronic. Ongoing.  Labs ordered today. Will make recommendations based on lab results.       Relevant Orders   Vitamin D (25 hydroxy)   B12   Estrogens, Total   Progesterone   FSH/LH   Thyroid Panel With TSH   Thyroid peroxidase antibody    Other Visit Diagnoses     Annual physical exam    -  Primary   Health maintenance reviewed during visit today. Labs ordered. Declines vaccines.  PAP up to date.    Relevant Orders   CBC with Differential/Platelet   Comprehensive metabolic panel   Lipid panel   Urinalysis, Routine w reflex microscopic   Abnormal menses       Relevant Orders   Vitamin D (25 hydroxy)   B12   Estrogens, Total   Progesterone   FSH/LH   Thyroid Panel With TSH   Thyroid peroxidase antibody        Follow up plan: Return in about 6 months (around 07/23/2022) for Depression/Anxiety FU.   LABORATORY TESTING:  - Pap smear: up to date  IMMUNIZATIONS:   - Tdap: Tetanus vaccination status reviewed: Refused - Influenza: Postponed to flu season - Pneumovax:  Not applicable - Prevnar: Not applicable - COVID: Not applicable - HPV: Not applicable - Shingrix vaccine: Not applicable  SCREENING: -Mammogram: Not applicable  - Colonoscopy: Not applicable  - Bone Density: Not applicable  -Hearing Test: Not applicable  -Spirometry: Not applicable   PATIENT COUNSELING:   Advised to take 1 mg of folate supplement per day if capable of pregnancy.   Sexuality: Discussed sexually transmitted diseases, partner selection, use of condoms, avoidance of unintended pregnancy  and contraceptive alternatives.   Advised to avoid cigarette smoking.  I discussed with the patient that most people either abstain from alcohol or drink within safe limits (<=14/week and <=4 drinks/occasion for males, <=7/weeks and <= 3 drinks/occasion for females) and that the risk for alcohol disorders and other health effects rises proportionally with the number of drinks per week and how often a drinker exceeds daily limits.  Discussed cessation/primary prevention of drug use and availability of treatment for abuse.   Diet: Encouraged to adjust caloric intake to maintain  or achieve ideal body weight, to reduce intake of dietary  saturated fat and total fat, to limit sodium intake by avoiding high sodium foods and not adding table salt, and to maintain adequate dietary potassium and calcium preferably from fresh fruits, vegetables, and low-fat dairy products.    stressed the importance of regular exercise  Injury prevention: Discussed safety belts, safety helmets, smoke detector, smoking near bedding or upholstery.   Dental health: Discussed importance of regular tooth brushing, flossing, and dental visits.    NEXT PREVENTATIVE PHYSICAL DUE IN 1 YEAR. Return in about 6 months (around 07/23/2022) for Depression/Anxiety FU.

## 2022-01-20 ENCOUNTER — Ambulatory Visit (INDEPENDENT_AMBULATORY_CARE_PROVIDER_SITE_OTHER): Payer: 59 | Admitting: Nurse Practitioner

## 2022-01-20 ENCOUNTER — Encounter: Payer: Self-pay | Admitting: Nurse Practitioner

## 2022-01-20 VITALS — BP 125/78 | HR 68 | Temp 99.1°F | Ht 65.35 in | Wt 184.2 lb

## 2022-01-20 DIAGNOSIS — E782 Mixed hyperlipidemia: Secondary | ICD-10-CM | POA: Diagnosis not present

## 2022-01-20 DIAGNOSIS — F4323 Adjustment disorder with mixed anxiety and depressed mood: Secondary | ICD-10-CM | POA: Diagnosis not present

## 2022-01-20 DIAGNOSIS — N926 Irregular menstruation, unspecified: Secondary | ICD-10-CM

## 2022-01-20 DIAGNOSIS — Z0000 Encounter for general adult medical examination without abnormal findings: Secondary | ICD-10-CM | POA: Diagnosis not present

## 2022-01-20 DIAGNOSIS — R5382 Chronic fatigue, unspecified: Secondary | ICD-10-CM

## 2022-01-20 DIAGNOSIS — E282 Polycystic ovarian syndrome: Secondary | ICD-10-CM

## 2022-01-20 DIAGNOSIS — G35 Multiple sclerosis: Secondary | ICD-10-CM | POA: Diagnosis not present

## 2022-01-20 LAB — URINALYSIS, ROUTINE W REFLEX MICROSCOPIC
Bilirubin, UA: NEGATIVE
Glucose, UA: NEGATIVE
Ketones, UA: NEGATIVE
Leukocytes,UA: NEGATIVE
Nitrite, UA: NEGATIVE
Protein,UA: NEGATIVE
RBC, UA: NEGATIVE
Specific Gravity, UA: 1.025 (ref 1.005–1.030)
Urobilinogen, Ur: 0.2 mg/dL (ref 0.2–1.0)
pH, UA: 5.5 (ref 5.0–7.5)

## 2022-01-20 NOTE — Assessment & Plan Note (Signed)
Chronic. Ongoing.  Patient is requesting hormone labs to check on her PCOS.  Will order labs today. Will make recommendations based on lab results.

## 2022-01-20 NOTE — Assessment & Plan Note (Signed)
Chronic. Improved from prior.  Continues with her therapy regularly.  Would like to continue the PRN Xanax.  Has used 1 in the last 3 months.  Discussed with her that I can give her 2 refills a year.  Patient currently still has some.  Will refill when she needs it.   Follow up in 6 months for reevaluation.  Call sooner if concerns arise.

## 2022-01-20 NOTE — Assessment & Plan Note (Signed)
Chronic.  Controlled with diet.  Labs ordered today.  Return to clinic in 6 months for reevaluation.  Call sooner if concerns arise.

## 2022-01-20 NOTE — Assessment & Plan Note (Signed)
Chronic. Ongoing.  Labs ordered today. Will make recommendations based on lab results.

## 2022-01-20 NOTE — Assessment & Plan Note (Signed)
Chronic.  Controlled.  Follow by Dr. In Berneice Gandy for MS and Lyme disease.  Labs ordered today.  Return to clinic in 6 months for reevaluation.  Call sooner if concerns arise.

## 2022-01-21 NOTE — Progress Notes (Signed)
HI [facility]. It was good to see you yesterday.  Your lab work looks good.  Cholesterol is elevated from prior.  I recommend working on a low fat diet and exercising as tolerated.  Your glucose is very mildly elevated.  We will recheck this at our next visit. Your hormones look good.  No concerns at this time.  Follow up as discussed.

## 2022-01-22 LAB — CBC WITH DIFFERENTIAL/PLATELET
Basophils Absolute: 0.1 10*3/uL (ref 0.0–0.2)
Basos: 1 %
EOS (ABSOLUTE): 0.1 10*3/uL (ref 0.0–0.4)
Eos: 2 %
Hematocrit: 41 % (ref 34.0–46.6)
Hemoglobin: 14.1 g/dL (ref 11.1–15.9)
Immature Grans (Abs): 0 10*3/uL (ref 0.0–0.1)
Immature Granulocytes: 0 %
Lymphocytes Absolute: 2.2 10*3/uL (ref 0.7–3.1)
Lymphs: 35 %
MCH: 31 pg (ref 26.6–33.0)
MCHC: 34.4 g/dL (ref 31.5–35.7)
MCV: 90 fL (ref 79–97)
Monocytes Absolute: 0.5 10*3/uL (ref 0.1–0.9)
Monocytes: 7 %
Neutrophils Absolute: 3.4 10*3/uL (ref 1.4–7.0)
Neutrophils: 55 %
Platelets: 291 10*3/uL (ref 150–450)
RBC: 4.55 x10E6/uL (ref 3.77–5.28)
RDW: 13.3 % (ref 11.7–15.4)
WBC: 6.3 10*3/uL (ref 3.4–10.8)

## 2022-01-22 LAB — THYROID PEROXIDASE ANTIBODY: Thyroperoxidase Ab SerPl-aCnc: 12 IU/mL (ref 0–34)

## 2022-01-22 LAB — LIPID PANEL
Chol/HDL Ratio: 7.5 ratio — ABNORMAL HIGH (ref 0.0–4.4)
Cholesterol, Total: 285 mg/dL — ABNORMAL HIGH (ref 100–199)
HDL: 38 mg/dL — ABNORMAL LOW (ref >39)
LDL Chol Calc (NIH): 207 mg/dL — ABNORMAL HIGH (ref 0–99)
Triglycerides: 206 mg/dL — ABNORMAL HIGH (ref 0–149)
VLDL Cholesterol Cal: 40 mg/dL (ref 5–40)

## 2022-01-22 LAB — COMPREHENSIVE METABOLIC PANEL
ALT: 23 IU/L (ref 0–32)
AST: 15 IU/L (ref 0–40)
Albumin/Globulin Ratio: 1.8 (ref 1.2–2.2)
Albumin: 4.5 g/dL (ref 3.9–4.9)
Alkaline Phosphatase: 43 IU/L — ABNORMAL LOW (ref 44–121)
BUN/Creatinine Ratio: 16 (ref 9–23)
BUN: 12 mg/dL (ref 6–20)
Bilirubin Total: 0.3 mg/dL (ref 0.0–1.2)
CO2: 24 mmol/L (ref 20–29)
Calcium: 10 mg/dL (ref 8.7–10.2)
Chloride: 101 mmol/L (ref 96–106)
Creatinine, Ser: 0.73 mg/dL (ref 0.57–1.00)
Globulin, Total: 2.5 g/dL (ref 1.5–4.5)
Glucose: 106 mg/dL — ABNORMAL HIGH (ref 70–99)
Potassium: 4.6 mmol/L (ref 3.5–5.2)
Sodium: 136 mmol/L (ref 134–144)
Total Protein: 7 g/dL (ref 6.0–8.5)
eGFR: 107 mL/min/{1.73_m2} (ref >59)

## 2022-01-22 LAB — PROGESTERONE: Progesterone: 0.4 ng/mL

## 2022-01-22 LAB — VITAMIN B12: Vitamin B-12: 531 pg/mL (ref 232–1245)

## 2022-01-22 LAB — VITAMIN D 25 HYDROXY (VIT D DEFICIENCY, FRACTURES): Vit D, 25-Hydroxy: 30.4 ng/mL (ref 30.0–100.0)

## 2022-01-22 LAB — THYROID PANEL WITH TSH
Free Thyroxine Index: 2.3 (ref 1.2–4.9)
T3 Uptake Ratio: 29 % (ref 24–39)
T4, Total: 8 ug/dL (ref 4.5–12.0)
TSH: 1.37 u[IU]/mL (ref 0.450–4.500)

## 2022-01-22 LAB — FSH/LH
FSH: 4.2 m[IU]/mL
LH: 11.8 m[IU]/mL

## 2022-01-22 LAB — ESTROGENS, TOTAL: Estrogen: 147 pg/mL

## 2022-01-27 ENCOUNTER — Ambulatory Visit: Payer: Self-pay | Admitting: *Deleted

## 2022-01-27 NOTE — Telephone Encounter (Signed)
  Chief Complaint: dizziness Symptoms: spinning Frequency: comes and goes, only lasts seconds at a time Pertinent Negatives: Patient denies fever, vomiting. 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 Advice given for tonight.  Reason for Disposition  [1] MODERATE dizziness (e.g., vertigo; feels very unsteady, interferes with normal activities) AND [2] has NOT been evaluated by doctor (or NP/PA) for this  Answer Assessment - Initial Assessment Questions 1. DESCRIPTION: "Describe your dizziness."     Spinning, headache 2. VERTIGO: "Do you feel like either you or the room is spinning or tilting?"      Spinning, unsure if tilting only lasts seconds 3. LIGHTHEADED: "Do you feel lightheaded?" (e.g., somewhat faint, woozy, weak upon standing)     Headache, most dizziness when sitting or laying down 4. SEVERITY: "How bad is it?"  "Can you walk?"   - MILD: Feels slightly dizzy and unsteady, but is walking normally.   - MODERATE: Feels unsteady when walking, but not falling; interferes with normal activities (e.g., school, work).   - SEVERE: Unable to walk without falling, or requires assistance to walk without falling.     Severe when hits, lasts a few seconds, but constant feels funny 5. ONSET:  "When did the dizziness begin?"     One week, worsening 2 days ago 6. AGGRAVATING FACTORS: "Does anything make it worse?" (e.g., standing, change in head position)       7. CAUSE: "What do you think is causing the dizziness?"     Unsure, had deep tissue massage on neck 8. RECURRENT SYMPTOM: "Have you had dizziness before?" If Yes, ask: "When was the last time?"  "What happened that time?"     no 9. OTHER SYMPTOMS: "Do you have any other symptoms?" (e.g., headache, weakness, numbness, vomiting, earache)     Headache 10. PREGNANCY: "Is there any chance you are pregnant?" "When was your last menstrual period?"       LMP 3 weeks ago  Protocols used: Dizziness - Vertigo-A-AH

## 2022-01-28 ENCOUNTER — Encounter: Payer: Self-pay | Admitting: Nurse Practitioner

## 2022-01-28 ENCOUNTER — Ambulatory Visit: Payer: 59 | Admitting: Nurse Practitioner

## 2022-01-28 VITALS — BP 126/82 | HR 67 | Temp 97.8°F | Wt 183.6 lb

## 2022-01-28 DIAGNOSIS — R42 Dizziness and giddiness: Secondary | ICD-10-CM | POA: Diagnosis not present

## 2022-01-28 MED ORDER — FLUTICASONE PROPIONATE 50 MCG/ACT NA SUSP: 2.0000 | Freq: Every day | NASAL | 0 refills | Status: DC

## 2022-01-28 NOTE — Progress Notes (Signed)
BP 126/82   Pulse 67   Temp 97.8 F (36.6 C) (Oral)   Wt 183 lb 9.6 oz (83.3 kg)   LMP 01/13/2022 (Exact Date)   SpO2 98%   BMI 30.22 kg/m    Subjective:    Patient ID: Shannon Davidson, female    DOB: 1981-04-12, 41 y.o.   MRN: [id number]  HPI: Shannon Davidson is a 41 y.o. female  Chief Complaint  Patient presents with   Dizziness    Growing dizziness per patient. Patient states room is spinning when episodes happen. Denies balance issues. Denies visual disturbances. Patient reports eyes feel tired. Denies ear pain    Headache    Constant "heavy" headache like feeling per patient. Denies vomiting, diarrhea. Mild nausea.    DIZZINESS Patient states she has been having dizziness for about 2-3 weeks.  She has a heaviness in her head.   Duration: days Description of symptoms: room spinning Duration of episode: seconds Dizziness frequency:  no history of the same Provoking factors:  turning her head Aggravating factors:   bright lights Triggered by rolling over in bed: yes Triggered by bending over: no Aggravated by head movement: yes Aggravated by exertion, coughing, loud noises: no Recent head injury: no Recent or current viral symptoms: no History of vasovagal episodes: no Nausea: yes Vomiting: no Tinnitus: yes Hearing loss: no Aural fullness: no Headache: yes Photophobia/phonophobia: yes Unsteady gait: no Postural instability: no Diplopia, dysarthria, dysphagia or weakness: no Related to exertion: no Pallor: no Diaphoresis: no Dyspnea: no Chest pain: no     Relevant past medical, surgical, family and social history reviewed and updated as indicated. Interim medical history since our last visit reviewed. Allergies and medications reviewed and updated.  Review of Systems  Constitutional:  Positive for fatigue.  HENT:  Negative for congestion.   Gastrointestinal:  Positive for nausea.  Neurological:  Positive for dizziness and headaches.    Per HPI  unless specifically indicated above     Objective:    BP 126/82   Pulse 67   Temp 97.8 F (36.6 C) (Oral)   Wt 183 lb 9.6 oz (83.3 kg)   LMP 01/13/2022 (Exact Date)   SpO2 98%   BMI 30.22 kg/m   Wt Readings from Last 3 Encounters:  01/28/22 183 lb 9.6 oz (83.3 kg)  01/20/22 184 lb 3.2 oz (83.6 kg)  09/04/21 185 lb (83.9 kg)    Physical Exam Vitals and nursing note reviewed.  Constitutional:      General: She is not in acute distress.    Appearance: Normal appearance. She is normal weight. She is not ill-appearing, toxic-appearing or diaphoretic.  HENT:     Head: Normocephalic.     Right Ear: External ear normal. A middle ear effusion is present. Tympanic membrane is not erythematous.     Left Ear: External ear normal. A middle ear effusion is present. Tympanic membrane is not erythematous.     Nose: Nose normal.     Mouth/Throat:     Mouth: Mucous membranes are moist.     Pharynx: Oropharynx is clear.  Eyes:     General:        Right eye: No discharge.        Left eye: No discharge.     Extraocular Movements: Extraocular movements intact.     Conjunctiva/sclera: Conjunctivae normal.     Pupils: Pupils are equal, round, and reactive to light.  Cardiovascular:     Rate and Rhythm:  Normal rate and regular rhythm.     Heart sounds: No murmur heard. Pulmonary:     Effort: Pulmonary effort is normal. No respiratory distress.     Breath sounds: Normal breath sounds. No wheezing or rales.  Musculoskeletal:     Cervical back: Normal range of motion and neck supple.  Skin:    General: Skin is warm and dry.     Capillary Refill: Capillary refill takes less than 2 seconds.  Neurological:     General: No focal deficit present.     Mental Status: She is alert and oriented to person, place, and time. Mental status is at baseline.  Psychiatric:        Mood and Affect: Mood normal.        Behavior: Behavior normal.        Thought Content: Thought content normal.         Judgment: Judgment normal.     Results for orders placed or performed in visit on 01/20/22  CBC with Differential/Platelet  Result Value Ref Range   WBC 6.3 3.4 - 10.8 x10E3/uL   RBC 4.55 3.77 - 5.28 x10E6/uL   Hemoglobin 14.1 11.1 - 15.9 g/dL   Hematocrit 41.0 34.0 - 46.6 %   MCV 90 79 - 97 fL   MCH 31.0 26.6 - 33.0 pg   MCHC 34.4 31.5 - 35.7 g/dL   RDW 13.3 11.7 - 15.4 %   Platelets 291 150 - 450 x10E3/uL   Neutrophils 55 Not Estab. %   Lymphs 35 Not Estab. %   Monocytes 7 Not Estab. %   Eos 2 Not Estab. %   Basos 1 Not Estab. %   Neutrophils Absolute 3.4 1.4 - 7.0 x10E3/uL   Lymphocytes Absolute 2.2 0.7 - 3.1 x10E3/uL   Monocytes Absolute 0.5 0.1 - 0.9 x10E3/uL   EOS (ABSOLUTE) 0.1 0.0 - 0.4 x10E3/uL   Basophils Absolute 0.1 0.0 - 0.2 x10E3/uL   Immature Granulocytes 0 Not Estab. %   Immature Grans (Abs) 0.0 0.0 - 0.1 x10E3/uL  Comprehensive metabolic panel  Result Value Ref Range   Glucose 106 (H) 70 - 99 mg/dL   BUN 12 6 - 20 mg/dL   Creatinine, Ser 0.73 0.57 - 1.00 mg/dL   eGFR 107 >59 mL/min/1.73   BUN/Creatinine Ratio 16 9 - 23   Sodium 136 134 - 144 mmol/L   Potassium 4.6 3.5 - 5.2 mmol/L   Chloride 101 96 - 106 mmol/L   CO2 24 20 - 29 mmol/L   Calcium 10.0 8.7 - 10.2 mg/dL   Total Protein 7.0 6.0 - 8.5 g/dL   Albumin 4.5 3.9 - 4.9 g/dL   Globulin, Total 2.5 1.5 - 4.5 g/dL   Albumin/Globulin Ratio 1.8 1.2 - 2.2   Bilirubin Total 0.3 0.0 - 1.2 mg/dL   Alkaline Phosphatase 43 (L) 44 - 121 IU/L   AST 15 0 - 40 IU/L   ALT 23 0 - 32 IU/L  Lipid panel  Result Value Ref Range   Cholesterol, Total 285 (H) 100 - 199 mg/dL   Triglycerides 206 (H) 0 - 149 mg/dL   HDL 38 (L) >39 mg/dL   VLDL Cholesterol Cal 40 5 - 40 mg/dL   LDL Chol Calc (NIH) 207 (H) 0 - 99 mg/dL   Lipid Comment: Comment    Chol/HDL Ratio 7.5 (H) 0.0 - 4.4 ratio  Urinalysis, Routine w reflex microscopic  Result Value Ref Range   Specific Gravity, UA 1.025 1.005 -  1.030   pH, UA 5.5 5.0 - 7.5    Color, UA Yellow Yellow   Appearance Ur Clear Clear   Leukocytes,UA Negative Negative   Protein,UA Negative Negative/Trace   Glucose, UA Negative Negative   Ketones, UA Negative Negative   RBC, UA Negative Negative   Bilirubin, UA Negative Negative   Urobilinogen, Ur 0.2 0.2 - 1.0 mg/dL   Nitrite, UA Negative Negative  Vitamin D (25 hydroxy)  Result Value Ref Range   Vit D, 25-Hydroxy 30.4 30.0 - 100.0 ng/mL  B12  Result Value Ref Range   Vitamin B-12 531 232 - 1,245 pg/mL  Estrogens, Total  Result Value Ref Range   Estrogen 147 pg/mL  Progesterone  Result Value Ref Range   Progesterone 0.4 ng/mL  FSH/LH  Result Value Ref Range   LH 11.8 mIU/mL   FSH 4.2 mIU/mL  Thyroid Panel With TSH  Result Value Ref Range   TSH 1.370 0.450 - 4.500 uIU/mL   T4, Total 8.0 4.5 - 12.0 ug/dL   T3 Uptake Ratio 29 24 - 39 %   Free Thyroxine Index 2.3 1.2 - 4.9  Thyroid peroxidase antibody  Result Value Ref Range   Thyroperoxidase Ab SerPl-aCnc 12 0 - 34 IU/mL      Assessment & Plan:   Problem List Items Addressed This Visit       Other   Dizziness - Primary    Started 2-3 weeks ago but has worsened in the last 3 days.  Accompanied by headache, light sensitivity, sound sensitivity and fatigue.  Question whether its dizziness from middle ear effusion or possibly a migraine.  Will treat with Flonase and Zyrtec D. Declines steroid course. Can also try Excedrin migraine to see if symptoms improve.  Follow up if not improved.         Follow up plan: Return if symptoms worsen or fail to improve.

## 2022-01-28 NOTE — Assessment & Plan Note (Signed)
Started 2-3 weeks ago but has worsened in the last 3 days.  Accompanied by headache, light sensitivity, sound sensitivity and fatigue.  Question whether its dizziness from middle ear effusion or possibly a migraine.  Will treat with Flonase and Zyrtec D. Declines steroid course. Can also try Excedrin migraine to see if symptoms improve.  Follow up if not improved.

## 2022-02-01 ENCOUNTER — Encounter: Payer: Self-pay | Admitting: Nurse Practitioner

## 2022-02-01 DIAGNOSIS — R42 Dizziness and giddiness: Secondary | ICD-10-CM

## 2022-02-01 NOTE — Telephone Encounter (Signed)
Referral is Tee'd up if you want to send, Does this patient need an appt first? Please advise

## 2022-02-10 ENCOUNTER — Encounter: Payer: Self-pay | Admitting: Nurse Practitioner

## 2022-02-10 DIAGNOSIS — R42 Dizziness and giddiness: Secondary | ICD-10-CM

## 2022-02-10 MED ORDER — ONDANSETRON HCL 4 MG PO TABS: 4.0000 mg | ORAL_TABLET | Freq: Three times a day (TID) | ORAL | 0 refills | Status: DC | PRN

## 2022-02-11 DIAGNOSIS — M5412 Radiculopathy, cervical region: Secondary | ICD-10-CM | POA: Diagnosis not present

## 2022-02-11 DIAGNOSIS — M9902 Segmental and somatic dysfunction of thoracic region: Secondary | ICD-10-CM | POA: Diagnosis not present

## 2022-02-11 DIAGNOSIS — M5033 Other cervical disc degeneration, cervicothoracic region: Secondary | ICD-10-CM | POA: Diagnosis not present

## 2022-02-11 DIAGNOSIS — M9901 Segmental and somatic dysfunction of cervical region: Secondary | ICD-10-CM | POA: Diagnosis not present

## 2022-02-16 ENCOUNTER — Ambulatory Visit
Admission: RE | Admit: 2022-02-16 | Discharge: 2022-02-16 | Disposition: A | Discharge status: Home / Self Care | Payer: 59 | Source: Ambulatory Visit | Attending: Nurse Practitioner | Admitting: Nurse Practitioner

## 2022-02-16 ENCOUNTER — Other Ambulatory Visit: Payer: Self-pay | Admitting: Nurse Practitioner

## 2022-02-16 DIAGNOSIS — R42 Dizziness and giddiness: Secondary | ICD-10-CM | POA: Diagnosis not present

## 2022-02-16 DIAGNOSIS — R519 Headache, unspecified: Secondary | ICD-10-CM | POA: Diagnosis not present

## 2022-02-16 MED ORDER — GADOBUTROL 1 MMOL/ML IV SOLN
7.5000 mL | Freq: Once | INTRAVENOUS | Status: AC | PRN
  Administered 2022-02-16: 7.5 mL via INTRAVENOUS

## 2022-02-17 ENCOUNTER — Other Ambulatory Visit: Payer: Self-pay

## 2022-02-17 MED ORDER — ALPRAZOLAM 0.25 MG PO TABS
0.2500 mg | ORAL_TABLET | Freq: Two times a day (BID) | ORAL | 0 refills | Status: DC | PRN
  Filled 2022-02-17 – 2022-03-08 (×2): qty 20, 10d supply, fill #0

## 2022-02-17 NOTE — Progress Notes (Signed)
Hi Shannon Davidson.  Your MRI was unremarkable for your symptoms.  Make sure to share with your Lyme Disease doctor.  Please let me know if you have any questions.

## 2022-03-04 ENCOUNTER — Other Ambulatory Visit: Payer: Self-pay

## 2022-03-08 ENCOUNTER — Other Ambulatory Visit: Payer: Self-pay

## 2022-03-08 DIAGNOSIS — H9312 Tinnitus, left ear: Secondary | ICD-10-CM | POA: Diagnosis not present

## 2022-03-08 DIAGNOSIS — H8102 Meniere's disease, left ear: Secondary | ICD-10-CM | POA: Diagnosis not present

## 2022-03-08 DIAGNOSIS — R42 Dizziness and giddiness: Secondary | ICD-10-CM | POA: Diagnosis not present

## 2022-03-08 DIAGNOSIS — H9042 Sensorineural hearing loss, unilateral, left ear, with unrestricted hearing on the contralateral side: Secondary | ICD-10-CM | POA: Diagnosis not present

## 2022-03-08 MED ORDER — HYDROCHLOROTHIAZIDE 12.5 MG PO TABS
ORAL_TABLET | ORAL | 4 refills | Status: DC
  Filled 2022-03-08: qty 90, 90d supply, fill #0
  Filled 2022-06-01: qty 90, 90d supply, fill #1

## 2022-03-16 ENCOUNTER — Encounter: Payer: Self-pay | Admitting: Oncology

## 2022-03-23 ENCOUNTER — Ambulatory Visit: Payer: 59 | Admitting: Podiatry

## 2022-03-30 ENCOUNTER — Ambulatory Visit (INDEPENDENT_AMBULATORY_CARE_PROVIDER_SITE_OTHER): Payer: 59 | Admitting: Podiatry

## 2022-03-30 DIAGNOSIS — B351 Tinea unguium: Secondary | ICD-10-CM | POA: Diagnosis not present

## 2022-03-30 NOTE — Progress Notes (Signed)
  Subjective:  Patient ID: Shannon Davidson, female    DOB: 1980/11/14,  MRN: [id number]  Chief Complaint  Patient presents with   Nail Problem    Nail fungus    41 y.o. female presents with the above complaint.  Patient presents with complaint bilateral hallux thickened elongated dystrophic mycotic nails x2.  She has tried all the over-the-counter medication.  She wanted get it evaluated she does not want to oral medication.  She states that it does not cause her pain.  She would like to discuss treatment options for it.   Review of Systems: Negative except as noted in the HPI. Denies N/V/F/Ch.  Past Medical History:  Diagnosis Date   Babesiasis    Bartonella infection    Elevated EBV antibody titer    History of kidney stones    HLD (hyperlipidemia)    Kidney stone    Dr. Bernardo Heater   Lyme disease    Palpitations    Pap smear for cervical cancer screening 2012   Dr. Benjie Karvonen   PCOS (polycystic ovarian syndrome)    Relapsing remitting multiple sclerosis ([place]) 11/25/2014   Patient says that all her MS symptoms went away in 2017-2018 after she was treated for tick-bourne illnesses    Current Outpatient Medications:    ALPRAZolam (XANAX) 0.25 MG tablet, Take 1 tablet (0.25 mg total) by mouth 2 (two) times daily as needed for anxiety., Disp: 20 tablet, Rfl: 0   fluticasone (FLONASE) 50 MCG/ACT nasal spray, Place 2 sprays into both nostrils daily., Disp: 16 g, Rfl: 0   hydrochlorothiazide (HYDRODIURIL) 12.5 MG tablet, One po qday, Disp: 90 tablet, Rfl: 4   ondansetron (ZOFRAN) 4 MG tablet, Take 1 tablet (4 mg total) by mouth every 8 (eight) hours as needed for nausea or vomiting., Disp: 20 tablet, Rfl: 0  Social History   Tobacco Use  Smoking Status Never  Smokeless Tobacco Never    Allergies  Allergen Reactions   Egg White (Egg Protein) Swelling   Influenza Vaccines Swelling   Objective:  There were no vitals filed for this visit. There is no height or weight on file to  calculate BMI. Constitutional Well developed. Well nourished.  Vascular Dorsalis pedis pulses palpable bilaterally. Posterior tibial pulses palpable bilaterally. Capillary refill normal to all digits.  No cyanosis or clubbing noted. Pedal hair growth normal.  Neurologic Normal speech. Oriented to person, place, and time. Epicritic sensation to light touch grossly present bilaterally.  Dermatologic Nails thickened elongated dystrophic mycotic toenails x2 bilateral hallux Skin normal limits  Orthopedic: Normal joint ROM without pain or crepitus bilaterally. No visible deformities. No bony tenderness.   Radiographs: None Assessment:   1. Nail fungus   2. Onychomycosis due to dermatophyte    Plan:  Patient was evaluated and treated and all questions answered.  Bilateral hallux onychomycosis -Educated the patient on the etiology of onychomycosis and various treatment options associated with improving the fungal load.  I explained to the patient that there is 3 treatment options available to treat the onychomycosis including topical, p.o., laser treatment.  Patient elected to undergo laser therapy.  She is scheduled for laser therapy.  I discussed with her to take 6 or 7  sessions.  If there is no improvement we can discuss Lamisil therapy  No follow-ups on file.

## 2022-04-02 DIAGNOSIS — M9902 Segmental and somatic dysfunction of thoracic region: Secondary | ICD-10-CM | POA: Diagnosis not present

## 2022-04-02 DIAGNOSIS — M5412 Radiculopathy, cervical region: Secondary | ICD-10-CM | POA: Diagnosis not present

## 2022-04-02 DIAGNOSIS — M5033 Other cervical disc degeneration, cervicothoracic region: Secondary | ICD-10-CM | POA: Diagnosis not present

## 2022-04-02 DIAGNOSIS — M9901 Segmental and somatic dysfunction of cervical region: Secondary | ICD-10-CM | POA: Diagnosis not present

## 2022-04-16 ENCOUNTER — Ambulatory Visit (INDEPENDENT_AMBULATORY_CARE_PROVIDER_SITE_OTHER): Payer: 59 | Admitting: Podiatry

## 2022-04-16 DIAGNOSIS — B351 Tinea unguium: Secondary | ICD-10-CM

## 2022-04-16 NOTE — Progress Notes (Signed)
Patient presents today for the 1st laser treatment. Diagnosed with nail fungus by Dr. Posey Pronto.   Toenail most affected bilateral hallux.   All other systems are negative.  Nails were filed thin. Laser therapy was administered to bilateral hallux and patient tolerated the treatment well. All safety precautions were in place.  Follow up in 4 weeks for laser # 2.

## 2022-04-16 NOTE — Patient Instructions (Signed)

## 2022-04-27 DIAGNOSIS — M9901 Segmental and somatic dysfunction of cervical region: Secondary | ICD-10-CM | POA: Diagnosis not present

## 2022-04-27 DIAGNOSIS — M5033 Other cervical disc degeneration, cervicothoracic region: Secondary | ICD-10-CM | POA: Diagnosis not present

## 2022-04-27 DIAGNOSIS — M9902 Segmental and somatic dysfunction of thoracic region: Secondary | ICD-10-CM | POA: Diagnosis not present

## 2022-04-27 DIAGNOSIS — M5412 Radiculopathy, cervical region: Secondary | ICD-10-CM | POA: Diagnosis not present

## 2022-05-11 ENCOUNTER — Ambulatory Visit (INDEPENDENT_AMBULATORY_CARE_PROVIDER_SITE_OTHER): Payer: 59 | Admitting: *Deleted

## 2022-05-11 DIAGNOSIS — B351 Tinea unguium: Secondary | ICD-10-CM

## 2022-05-11 NOTE — Progress Notes (Signed)
Patient presents today for the 2nd laser treatment. Diagnosed with nail fungus by Dr. Posey Pronto.   Toenail most affected bilateral hallux. No changes as of yet.  All other systems are negative.  Nails were filed thin. Laser therapy was administered to bilateral hallux and patient tolerated the treatment well. All safety precautions were in place.  Follow up in 4 weeks for laser # 3.

## 2022-05-13 ENCOUNTER — Encounter: Payer: Self-pay | Admitting: Nurse Practitioner

## 2022-05-13 NOTE — Progress Notes (Deleted)
There were no vitals taken for this visit.   Subjective:    Patient ID: Shannon Davidson, female    DOB: 26-Apr-1981, 41 y.o.   MRN: [id number]  HPI: Shannon Davidson is a 41 y.o. female  No chief complaint on file.  VAGINAL DISCHARGE Duration: {Blank single:19197::"days","weeks","months"} Discharge description: {Blank single:19197::"white","cottage cheese","clear","yellow","mucous"}  Pruritus: {Blank single:19197::"yes","no"} Dysuria: {Blank single:19197::"yes","no"} Malodorous: {Blank single:19197::"yes","no","worse after sex"} Urinary frequency: {Blank single:19197::"yes","no"} Fevers: {Blank single:19197::"yes","no"} Abdominal pain: {Blank single:19197::"yes","no"}  Sexual activity: {Blank single:19197::"not sexually active","monogamous","practicing safe sex","concerned about STIs"} History of sexually transmitted diseases: {Blank single:19197::"yes","no"} Recent antibiotic use: {Blank single:19197::"yes","no"} Context: {Blank multiple:19196::"better","worse","previous yeast infections","recurrent yeast infections","recurrent BV"}  Treatments attempted: {Blank multiple:19196::"none","antifungal","vagisil"}  Relevant past medical, surgical, family and social history reviewed and updated as indicated. Interim medical history since our last visit reviewed. Allergies and medications reviewed and updated.  Review of Systems  Per HPI unless specifically indicated above     Objective:    There were no vitals taken for this visit.  Wt Readings from Last 3 Encounters:  01/28/22 183 lb 9.6 oz (83.3 kg)  01/20/22 184 lb 3.2 oz (83.6 kg)  09/04/21 185 lb (83.9 kg)    Physical Exam  Results for orders placed or performed in visit on 01/20/22  CBC with Differential/Platelet  Result Value Ref Range   WBC 6.3 3.4 - 10.8 x10E3/uL   RBC 4.55 3.77 - 5.28 x10E6/uL   Hemoglobin 14.1 11.1 - 15.9 g/dL   Hematocrit 41.0 34.0 - 46.6 %   MCV 90 79 - 97 fL   MCH 31.0 26.6 - 33.0 pg   MCHC  34.4 31.5 - 35.7 g/dL   RDW 13.3 11.7 - 15.4 %   Platelets 291 150 - 450 x10E3/uL   Neutrophils 55 Not Estab. %   Lymphs 35 Not Estab. %   Monocytes 7 Not Estab. %   Eos 2 Not Estab. %   Basos 1 Not Estab. %   Neutrophils Absolute 3.4 1.4 - 7.0 x10E3/uL   Lymphocytes Absolute 2.2 0.7 - 3.1 x10E3/uL   Monocytes Absolute 0.5 0.1 - 0.9 x10E3/uL   EOS (ABSOLUTE) 0.1 0.0 - 0.4 x10E3/uL   Basophils Absolute 0.1 0.0 - 0.2 x10E3/uL   Immature Granulocytes 0 Not Estab. %   Immature Grans (Abs) 0.0 0.0 - 0.1 x10E3/uL  Comprehensive metabolic panel  Result Value Ref Range   Glucose 106 (H) 70 - 99 mg/dL   BUN 12 6 - 20 mg/dL   Creatinine, Ser 0.73 0.57 - 1.00 mg/dL   eGFR 107 >59 mL/min/1.73   BUN/Creatinine Ratio 16 9 - 23   Sodium 136 134 - 144 mmol/L   Potassium 4.6 3.5 - 5.2 mmol/L   Chloride 101 96 - 106 mmol/L   CO2 24 20 - 29 mmol/L   Calcium 10.0 8.7 - 10.2 mg/dL   Total Protein 7.0 6.0 - 8.5 g/dL   Albumin 4.5 3.9 - 4.9 g/dL   Globulin, Total 2.5 1.5 - 4.5 g/dL   Albumin/Globulin Ratio 1.8 1.2 - 2.2   Bilirubin Total 0.3 0.0 - 1.2 mg/dL   Alkaline Phosphatase 43 (L) 44 - 121 IU/L   AST 15 0 - 40 IU/L   ALT 23 0 - 32 IU/L  Lipid panel  Result Value Ref Range   Cholesterol, Total 285 (H) 100 - 199 mg/dL   Triglycerides 206 (H) 0 - 149 mg/dL   HDL 38 (L) >39 mg/dL   VLDL Cholesterol Cal 40 5 - 40 mg/dL  LDL Chol Calc (NIH) 207 (H) 0 - 99 mg/dL   Lipid Comment: Comment    Chol/HDL Ratio 7.5 (H) 0.0 - 4.4 ratio  Urinalysis, Routine w reflex microscopic  Result Value Ref Range   Specific Gravity, UA 1.025 1.005 - 1.030   pH, UA 5.5 5.0 - 7.5   Color, UA Yellow Yellow   Appearance Ur Clear Clear   Leukocytes,UA Negative Negative   Protein,UA Negative Negative/Trace   Glucose, UA Negative Negative   Ketones, UA Negative Negative   RBC, UA Negative Negative   Bilirubin, UA Negative Negative   Urobilinogen, Ur 0.2 0.2 - 1.0 mg/dL   Nitrite, UA Negative Negative  Vitamin  D (25 hydroxy)  Result Value Ref Range   Vit D, 25-Hydroxy 30.4 30.0 - 100.0 ng/mL  B12  Result Value Ref Range   Vitamin B-12 531 232 - 1,245 pg/mL  Estrogens, Total  Result Value Ref Range   Estrogen 147 pg/mL  Progesterone  Result Value Ref Range   Progesterone 0.4 ng/mL  FSH/LH  Result Value Ref Range   LH 11.8 mIU/mL   FSH 4.2 mIU/mL  Thyroid Panel With TSH  Result Value Ref Range   TSH 1.370 0.450 - 4.500 uIU/mL   T4, Total 8.0 4.5 - 12.0 ug/dL   T3 Uptake Ratio 29 24 - 39 %   Free Thyroxine Index 2.3 1.2 - 4.9  Thyroid peroxidase antibody  Result Value Ref Range   Thyroperoxidase Ab SerPl-aCnc 12 0 - 34 IU/mL      Assessment & Plan:   Problem List Items Addressed This Visit   None    Follow up plan: No follow-ups on file.  This visit was completed via MyChart due to the restrictions of the COVID-19 pandemic. All issues as above were discussed and addressed. Physical exam was done as above through visual confirmation on MyChart. If it was felt that the patient should be evaluated in the office, they were directed there. The patient verbally consented to this visit. Location of the patient: *** Location of the provider: Office Those involved with this call:  Provider: Jon Billings, NP CMA: *** Front Desk/Registration: *** This encounter was conducted via ***.  I spent *** dedicated to the care of this patient on the date of this encounter to include previsit review of ***, face to face time with the patient, and post visit ordering of testing.

## 2022-05-14 ENCOUNTER — Telehealth: Payer: 59 | Admitting: Nurse Practitioner

## 2022-05-14 ENCOUNTER — Encounter: Payer: Self-pay | Admitting: Nurse Practitioner

## 2022-05-25 DIAGNOSIS — M5412 Radiculopathy, cervical region: Secondary | ICD-10-CM | POA: Diagnosis not present

## 2022-05-25 DIAGNOSIS — M9902 Segmental and somatic dysfunction of thoracic region: Secondary | ICD-10-CM | POA: Diagnosis not present

## 2022-05-25 DIAGNOSIS — M5033 Other cervical disc degeneration, cervicothoracic region: Secondary | ICD-10-CM | POA: Diagnosis not present

## 2022-05-25 DIAGNOSIS — M9901 Segmental and somatic dysfunction of cervical region: Secondary | ICD-10-CM | POA: Diagnosis not present

## 2022-06-01 ENCOUNTER — Other Ambulatory Visit: Payer: Self-pay

## 2022-06-01 ENCOUNTER — Other Ambulatory Visit: Payer: Self-pay | Admitting: Nurse Practitioner

## 2022-06-01 NOTE — Telephone Encounter (Signed)
Requested medications are due for refill today.  yes  Requested medications are on the active medications list.  yes  Last refill. 02/17/2022 #20  Future visit scheduled.   yes  Notes to clinic.  Refill not delegated.    Requested Prescriptions  Pending Prescriptions Disp Refills   ALPRAZolam (XANAX) 0.25 MG tablet 20 tablet 0    Sig: Take 1 tablet (0.25 mg total) by mouth 2 (two) times daily as needed for anxiety.     Not Delegated - Psychiatry: Anxiolytics/Hypnotics 2 Failed - 06/01/2022  5:46 PM      Failed - This refill cannot be delegated      Failed - Urine Drug Screen completed in last 360 days      Passed - Patient is not pregnant      Passed - Valid encounter within last 6 months    Recent Outpatient Visits           4 months ago [place], NP   4 months ago Annual physical exam   [place], NP   7 months ago Adjustment disorder with mixed anxiety and depressed mood   [place], NP   8 months ago Adjustment disorder with mixed anxiety and depressed mood   [place], NP   8 months ago Adjustment disorder with mixed anxiety and depressed mood   [place], NP       Future Appointments             In 1 month Jon Billings, NP [facility] [place], [place]   In 3 months Stoioff, Ronda Fairly, MD [place]

## 2022-06-01 NOTE — Telephone Encounter (Signed)
Requested medication (s) are due for refill today: yes  Requested medication (s) are on the active medication list: yes  Last refill:  02/17/22 #20  Future visit scheduled: yes  Notes to clinic:  med not delegated to NT to reorder   Requested Prescriptions  Pending Prescriptions Disp Refills   ALPRAZolam (XANAX) 0.25 MG tablet 20 tablet 0    Sig: Take 1 tablet (0.25 mg total) by mouth 2 (two) times daily as needed for anxiety.     Not Delegated - Psychiatry: Anxiolytics/Hypnotics 2 Failed - 06/01/2022  3:37 PM      Failed - This refill cannot be delegated      Failed - Urine Drug Screen completed in last 360 days      Passed - Patient is not pregnant      Passed - Valid encounter within last 6 months    Recent Outpatient Visits           4 months ago [place], NP   4 months ago Annual physical exam   [place], NP   7 months ago Adjustment disorder with mixed anxiety and depressed mood   [place], NP   8 months ago Adjustment disorder with mixed anxiety and depressed mood   [place], NP   8 months ago Adjustment disorder with mixed anxiety and depressed mood   [place], NP       Future Appointments             In 1 month Jon Billings, NP [facility], [place]   In 3 months Stoioff, Ronda Fairly, MD [place]

## 2022-06-02 ENCOUNTER — Other Ambulatory Visit: Payer: Self-pay

## 2022-06-02 MED FILL — Alprazolam Tab 0.25 MG: ORAL | 10 days supply | Qty: 20 | Fill #0 | Status: AC

## 2022-06-04 ENCOUNTER — Other Ambulatory Visit: Payer: Self-pay

## 2022-06-08 ENCOUNTER — Ambulatory Visit (INDEPENDENT_AMBULATORY_CARE_PROVIDER_SITE_OTHER): Payer: 59

## 2022-06-08 DIAGNOSIS — B351 Tinea unguium: Secondary | ICD-10-CM

## 2022-06-08 NOTE — Progress Notes (Signed)
Patient presents today for the 3rd laser treatment. Diagnosed with mycotic nail infection by Dr. Posey Pronto.   Toenail most affected bilateral hallux.  All other systems are negative.  Nails were filed thin. Laser therapy was administered to bilateral hallux.  patient tolerated the treatment well. All safety precautions were in place.   Single laser pass was done on non-affected nails.   Follow up in 6 weeks for laser # 4.  Picture of nails taken today to document visual progress

## 2022-06-24 ENCOUNTER — Encounter: Payer: Self-pay | Admitting: Oncology

## 2022-06-26 ENCOUNTER — Encounter: Payer: Self-pay | Admitting: Oncology

## 2022-06-29 ENCOUNTER — Telehealth (INDEPENDENT_AMBULATORY_CARE_PROVIDER_SITE_OTHER): Payer: 59 | Admitting: Nurse Practitioner

## 2022-06-29 ENCOUNTER — Other Ambulatory Visit: Payer: Self-pay

## 2022-06-29 ENCOUNTER — Encounter: Payer: Self-pay | Admitting: Nurse Practitioner

## 2022-06-29 VITALS — Temp 100.5°F

## 2022-06-29 DIAGNOSIS — J0110 Acute frontal sinusitis, unspecified: Secondary | ICD-10-CM

## 2022-06-29 MED ORDER — HYDROCOD POLI-CHLORPHE POLI ER 10-8 MG/5ML PO SUER
5.0000 mL | Freq: Every evening | ORAL | 0 refills | Status: DC | PRN
  Filled 2022-06-29: qty 115, 23d supply, fill #0

## 2022-06-29 MED ORDER — AMOXICILLIN 500 MG PO CAPS
500.0000 mg | ORAL_CAPSULE | Freq: Two times a day (BID) | ORAL | 0 refills | Status: AC
  Filled 2022-06-29: qty 20, 10d supply, fill #0

## 2022-06-29 NOTE — Progress Notes (Signed)
Temp (!) 100.5 F (38.1 C) (Tympanic)    Subjective:    Patient ID: Shannon Davidson, female    DOB: July 31, 1980, 41 y.o.   MRN: [id number]  HPI: Shannon Davidson is a 41 y.o. female  Chief Complaint  Patient presents with   URI    Pt states she tested positive for covid 06/13/22. States since then, she has had congestion, cough, fever of 100.5, body aches, and chills. Wants to know if she can be prescribed an antibiotic and cough syrup    UPPER RESPIRATORY TRACT INFECTION Worst symptom: COVID on 06/13/22.  She felt like the worst of it was better after 7 days.   Fever: yes Cough: yes Shortness of breath: no Wheezing: no Chest pain: no Chest tightness: no Chest congestion: no Nasal congestion: yes Runny nose: no Post nasal drip: no Sneezing: yes Sore throat: yes Swollen glands: yes Sinus pressure: yes Headache: yes Face pain: yes Toothache: no Ear pain: no bilateral Ear pressure: yes bilateral Eyes red/itching:no Eye drainage/crusting: no  Vomiting: no Rash: no Fatigue: yes Sick contacts: no Strep contacts: no  Context: stable Recurrent sinusitis: no Relief with OTC cold/cough medications: yes  Treatments attempted:  tylenol/ and ibuprofen and mucinex   Relevant past medical, surgical, family and social history reviewed and updated as indicated. Interim medical history since our last visit reviewed. Allergies and medications reviewed and updated.  Review of Systems  Constitutional:  Positive for fatigue and fever.  HENT:  Positive for congestion, postnasal drip, rhinorrhea, sinus pressure, sinus pain and sneezing. Negative for dental problem, ear pain and sore throat.   Respiratory:  Positive for cough. Negative for shortness of breath and wheezing.   Cardiovascular:  Negative for chest pain.  Gastrointestinal:  Negative for vomiting.  Skin:  Negative for rash.  Neurological:  Positive for headaches.    Per HPI unless specifically indicated above      Objective:    Temp (!) 100.5 F (38.1 C) (Tympanic)   Wt Readings from Last 3 Encounters:  01/28/22 183 lb 9.6 oz (83.3 kg)  01/20/22 184 lb 3.2 oz (83.6 kg)  09/04/21 185 lb (83.9 kg)    Physical Exam Vitals and nursing note reviewed.  HENT:     Head: Normocephalic.     Right Ear: Hearing normal.     Left Ear: Hearing normal.     Nose: Nose normal.  Eyes:     Pupils: Pupils are equal, round, and reactive to light.  Pulmonary:     Effort: Pulmonary effort is normal. No respiratory distress.  Neurological:     Mental Status: She is alert.  Psychiatric:        Mood and Affect: Mood normal.        Behavior: Behavior normal.        Thought Content: Thought content normal.        Judgment: Judgment normal.     Results for orders placed or performed in visit on 01/20/22  CBC with Differential/Platelet  Result Value Ref Range   WBC 6.3 3.4 - 10.8 x10E3/uL   RBC 4.55 3.77 - 5.28 x10E6/uL   Hemoglobin 14.1 11.1 - 15.9 g/dL   Hematocrit 41.0 34.0 - 46.6 %   MCV 90 79 - 97 fL   MCH 31.0 26.6 - 33.0 pg   MCHC 34.4 31.5 - 35.7 g/dL   RDW 13.3 11.7 - 15.4 %   Platelets 291 150 - 450 x10E3/uL   Neutrophils 55 Not Estab. %  Lymphs 35 Not Estab. %   Monocytes 7 Not Estab. %   Eos 2 Not Estab. %   Basos 1 Not Estab. %   Neutrophils Absolute 3.4 1.4 - 7.0 x10E3/uL   Lymphocytes Absolute 2.2 0.7 - 3.1 x10E3/uL   Monocytes Absolute 0.5 0.1 - 0.9 x10E3/uL   EOS (ABSOLUTE) 0.1 0.0 - 0.4 x10E3/uL   Basophils Absolute 0.1 0.0 - 0.2 x10E3/uL   Immature Granulocytes 0 Not Estab. %   Immature Grans (Abs) 0.0 0.0 - 0.1 x10E3/uL  Comprehensive metabolic panel  Result Value Ref Range   Glucose 106 (H) 70 - 99 mg/dL   BUN 12 6 - 20 mg/dL   Creatinine, Ser 0.73 0.57 - 1.00 mg/dL   eGFR 107 >59 mL/min/1.73   BUN/Creatinine Ratio 16 9 - 23   Sodium 136 134 - 144 mmol/L   Potassium 4.6 3.5 - 5.2 mmol/L   Chloride 101 96 - 106 mmol/L   CO2 24 20 - 29 mmol/L   Calcium 10.0 8.7 - 10.2  mg/dL   Total Protein 7.0 6.0 - 8.5 g/dL   Albumin 4.5 3.9 - 4.9 g/dL   Globulin, Total 2.5 1.5 - 4.5 g/dL   Albumin/Globulin Ratio 1.8 1.2 - 2.2   Bilirubin Total 0.3 0.0 - 1.2 mg/dL   Alkaline Phosphatase 43 (L) 44 - 121 IU/L   AST 15 0 - 40 IU/L   ALT 23 0 - 32 IU/L  Lipid panel  Result Value Ref Range   Cholesterol, Total 285 (H) 100 - 199 mg/dL   Triglycerides 206 (H) 0 - 149 mg/dL   HDL 38 (L) >39 mg/dL   VLDL Cholesterol Cal 40 5 - 40 mg/dL   LDL Chol Calc (NIH) 207 (H) 0 - 99 mg/dL   Lipid Comment: Comment    Chol/HDL Ratio 7.5 (H) 0.0 - 4.4 ratio  Urinalysis, Routine w reflex microscopic  Result Value Ref Range   Specific Gravity, UA 1.025 1.005 - 1.030   pH, UA 5.5 5.0 - 7.5   Color, UA Yellow Yellow   Appearance Ur Clear Clear   Leukocytes,UA Negative Negative   Protein,UA Negative Negative/Trace   Glucose, UA Negative Negative   Ketones, UA Negative Negative   RBC, UA Negative Negative   Bilirubin, UA Negative Negative   Urobilinogen, Ur 0.2 0.2 - 1.0 mg/dL   Nitrite, UA Negative Negative  Vitamin D (25 hydroxy)  Result Value Ref Range   Vit D, 25-Hydroxy 30.4 30.0 - 100.0 ng/mL  B12  Result Value Ref Range   Vitamin B-12 531 232 - 1,245 pg/mL  Estrogens, Total  Result Value Ref Range   Estrogen 147 pg/mL  Progesterone  Result Value Ref Range   Progesterone 0.4 ng/mL  FSH/LH  Result Value Ref Range   LH 11.8 mIU/mL   FSH 4.2 mIU/mL  Thyroid Panel With TSH  Result Value Ref Range   TSH 1.370 0.450 - 4.500 uIU/mL   T4, Total 8.0 4.5 - 12.0 ug/dL   T3 Uptake Ratio 29 24 - 39 %   Free Thyroxine Index 2.3 1.2 - 4.9  Thyroid peroxidase antibody  Result Value Ref Range   Thyroperoxidase Ab SerPl-aCnc 12 0 - 34 IU/mL      Assessment & Plan:   Problem List Items Addressed This Visit   None Visit Diagnoses     Acute non-recurrent frontal sinusitis    -  Primary   Will treat with amoxicillin. Complete course of antibiotics. Will also give  cough  medicine to help with symptoms. Continue with OTC symptom management.   Relevant Medications   amoxicillin (AMOXIL) 500 MG capsule   chlorpheniramine-HYDROcodone (TUSSIONEX) 10-8 MG/5ML        Follow up plan: Return if symptoms worsen or fail to improve.   This visit was completed via MyChart due to the restrictions of the COVID-19 pandemic. All issues as above were discussed and addressed. Physical exam was done as above through visual confirmation on MyChart. If it was felt that the patient should be evaluated in the office, they were directed there. The patient verbally consented to this visit. Location of the patient: Home Location of the provider: Office Those involved with this call:  Provider: Jon Billings, NP CMA: Yvonna Alanis, CMA Front Desk/Registration: Lynnell Catalan This encounter was conducted via video.  I spent 20 dedicated to the care of this patient on the date of this encounter to include previsit review of symptoms, plan of care and follow up, face to face time with the patient, and post visit ordering of testing.

## 2022-07-07 ENCOUNTER — Encounter: Payer: Self-pay | Admitting: Nurse Practitioner

## 2022-07-08 ENCOUNTER — Other Ambulatory Visit: Payer: Self-pay | Admitting: Nurse Practitioner

## 2022-07-08 ENCOUNTER — Other Ambulatory Visit: Payer: Self-pay

## 2022-07-09 MED ORDER — HYDROCOD POLI-CHLORPHE POLI ER 10-8 MG/5ML PO SUER: 5.0000 mL | Freq: Every evening | ORAL | 0 refills | Status: DC | PRN

## 2022-07-09 MED ORDER — ALBUTEROL SULFATE HFA 108 (90 BASE) MCG/ACT IN AERS: 2.0000 | INHALATION_SPRAY | Freq: Four times a day (QID) | RESPIRATORY_TRACT | 0 refills | Status: DC | PRN

## 2022-07-10 NOTE — Telephone Encounter (Signed)
Requested medication (s) are due for refill today: Yes  Requested medication (s) are on the active medication list: Yes  Last refill:  07/09/22  Future visit scheduled: Yes  Notes to clinic:  Unable to refill per protocol, cannot delegate.      Requested Prescriptions  Pending Prescriptions Disp Refills   chlorpheniramine-HYDROcodone (TUSSIONEX) 10-8 MG/5ML 115 mL 0    Sig: Take 5 mLs by mouth at bedtime as needed for cough.     Off-Protocol Failed - 07/08/2022 12:41 PM      Failed - Medication not assigned to a protocol, review manually.      Passed - Valid encounter within last 12 months    Recent Outpatient Visits           1 week ago Acute non-recurrent frontal sinusitis   [place], NP   5 months ago Dizziness   [facility] Jon Billings, NP   5 months ago Annual physical exam   [facility] Jon Billings, NP   8 months ago Adjustment disorder with mixed anxiety and depressed mood   [facility] Jon Billings, NP   9 months ago Adjustment disorder with mixed anxiety and depressed mood   [place], NP       Future Appointments             In 1 week Jon Billings, NP [facility], [place]   In 1 month Stoioff, Ronda Fairly, MD [place]

## 2022-07-10 NOTE — Telephone Encounter (Signed)
Patient called, left VM to return the call to the office to clarify if she wants this medication refilled at [facility], it was sent on 07/09/22 to [place]. Sent a MyChart message as well.

## 2022-07-13 ENCOUNTER — Encounter: Payer: Self-pay | Admitting: Oncology

## 2022-07-15 ENCOUNTER — Other Ambulatory Visit (HOSPITAL_COMMUNITY): Payer: Self-pay

## 2022-07-20 ENCOUNTER — Ambulatory Visit (INDEPENDENT_AMBULATORY_CARE_PROVIDER_SITE_OTHER): Payer: Commercial Managed Care - PPO

## 2022-07-20 ENCOUNTER — Other Ambulatory Visit: Payer: Self-pay

## 2022-07-20 ENCOUNTER — Encounter: Payer: Self-pay | Admitting: Oncology

## 2022-07-20 DIAGNOSIS — B351 Tinea unguium: Secondary | ICD-10-CM

## 2022-07-20 NOTE — Progress Notes (Signed)
Patient presents today for the 4th laser treatment. Diagnosed with mycotic nail infection by Dr. Posey Pronto.   Toenail most affected bilateral hallux.  All other systems are negative.  Nails were filed thin. Laser therapy was administered to bilateral hallux.  patient tolerated the treatment well. All safety precautions were in place.      Follow up in 6 weeks for laser # 5.

## 2022-07-23 ENCOUNTER — Ambulatory Visit (INDEPENDENT_AMBULATORY_CARE_PROVIDER_SITE_OTHER): Payer: 59 | Admitting: Nurse Practitioner

## 2022-07-23 ENCOUNTER — Encounter: Payer: Self-pay | Admitting: Nurse Practitioner

## 2022-07-23 ENCOUNTER — Encounter: Payer: Self-pay | Admitting: Oncology

## 2022-07-23 VITALS — BP 109/71 | HR 70 | Temp 98.8°F | Wt 172.1 lb

## 2022-07-23 DIAGNOSIS — F4323 Adjustment disorder with mixed anxiety and depressed mood: Secondary | ICD-10-CM

## 2022-07-23 DIAGNOSIS — E782 Mixed hyperlipidemia: Secondary | ICD-10-CM | POA: Diagnosis not present

## 2022-07-23 DIAGNOSIS — R5382 Chronic fatigue, unspecified: Secondary | ICD-10-CM

## 2022-07-23 NOTE — Assessment & Plan Note (Signed)
Vitamin D in July was 30.4.  Will recheck vitamin D at visit today.

## 2022-07-23 NOTE — Progress Notes (Signed)
BP 109/71   Pulse 70   Temp 98.8 F (37.1 C) (Oral)   Wt 172 lb 1.6 oz (78.1 kg)   LMP 07/09/2022 (Approximate)   SpO2 98%   BMI 28.33 kg/m    Subjective:    Patient ID: Shannon Davidson, female    DOB: 1980-11-16, 42 y.o.   MRN: [id number]  HPI: Shannon Davidson is a 42 y.o. female  Chief Complaint  Patient presents with   Depression   ANXIETY/DEPRESSION Patient states she is doing pretty well.  She states the holiday's are hard.  She was sick for all of December.  She has some situational issues that have caused her to be more down.  She has considered going back on wellbutrin but isn't sure.  Is doing therapy.    [facility] Visit from 07/23/2022 in [place]  PHQ-9 Total Score 5         07/23/2022   10:09 AM 01/28/2022   11:21 AM 01/20/2022    8:02 AM 10/29/2021   11:14 AM  GAD 7 : Generalized Anxiety Score  Nervous, Anxious, on Edge 0 0 0 1  Control/stop worrying 0 0 0 0  Worry too much - different things 0 0 1 1  Trouble relaxing 0 '2 1 2  '$ Restless 0 0 0 0  Easily annoyed or irritable 0 '1 1 1  '$ Afraid - awful might happen 0 0 0 0  Total GAD 7 Score 0 '3 3 5  '$ Anxiety Difficulty Not difficult at all Somewhat difficult Somewhat difficult Not difficult at all   Patient has had vaginal discomfort.  No odor, no discharge.  But she does have some itching.     Relevant past medical, surgical, family and social history reviewed and updated as indicated. Interim medical history since our last visit reviewed. Allergies and medications reviewed and updated.  Review of Systems  Constitutional:  Positive for fatigue.  Genitourinary:        Vaginal itching  Psychiatric/Behavioral:  Positive for dysphoric mood. Negative for suicidal ideas. The patient is nervous/anxious.     Per HPI unless specifically indicated above     Objective:    BP 109/71   Pulse 70   Temp 98.8 F (37.1 C) (Oral)   Wt 172 lb 1.6 oz (78.1 kg)   LMP 07/09/2022 (Approximate)    SpO2 98%   BMI 28.33 kg/m   Wt Readings from Last 3 Encounters:  07/23/22 172 lb 1.6 oz (78.1 kg)  01/28/22 183 lb 9.6 oz (83.3 kg)  01/20/22 184 lb 3.2 oz (83.6 kg)    Physical Exam Vitals and nursing note reviewed.  Constitutional:      General: She is not in acute distress.    Appearance: Normal appearance. She is normal weight. She is not ill-appearing, toxic-appearing or diaphoretic.  HENT:     Head: Normocephalic.     Right Ear: External ear normal.     Left Ear: External ear normal.     Nose: Nose normal.     Mouth/Throat:     Mouth: Mucous membranes are moist.     Pharynx: Oropharynx is clear.  Eyes:     General:        Right eye: No discharge.        Left eye: No discharge.     Extraocular Movements: Extraocular movements intact.     Conjunctiva/sclera: Conjunctivae normal.     Pupils: Pupils are equal, round, and reactive to  light.  Cardiovascular:     Rate and Rhythm: Normal rate and regular rhythm.     Heart sounds: No murmur heard. Pulmonary:     Effort: Pulmonary effort is normal. No respiratory distress.     Breath sounds: Normal breath sounds. No wheezing or rales.  Musculoskeletal:     Cervical back: Normal range of motion and neck supple.  Skin:    General: Skin is warm and dry.     Capillary Refill: Capillary refill takes less than 2 seconds.  Neurological:     General: No focal deficit present.     Mental Status: She is alert and oriented to person, place, and time. Mental status is at baseline.  Psychiatric:        Mood and Affect: Mood normal.        Behavior: Behavior normal.        Thought Content: Thought content normal.        Judgment: Judgment normal.     Results for orders placed or performed in visit on 01/20/22  CBC with Differential/Platelet  Result Value Ref Range   WBC 6.3 3.4 - 10.8 x10E3/uL   RBC 4.55 3.77 - 5.28 x10E6/uL   Hemoglobin 14.1 11.1 - 15.9 g/dL   Hematocrit 41.0 34.0 - 46.6 %   MCV 90 79 - 97 fL   MCH 31.0  26.6 - 33.0 pg   MCHC 34.4 31.5 - 35.7 g/dL   RDW 13.3 11.7 - 15.4 %   Platelets 291 150 - 450 x10E3/uL   Neutrophils 55 Not Estab. %   Lymphs 35 Not Estab. %   Monocytes 7 Not Estab. %   Eos 2 Not Estab. %   Basos 1 Not Estab. %   Neutrophils Absolute 3.4 1.4 - 7.0 x10E3/uL   Lymphocytes Absolute 2.2 0.7 - 3.1 x10E3/uL   Monocytes Absolute 0.5 0.1 - 0.9 x10E3/uL   EOS (ABSOLUTE) 0.1 0.0 - 0.4 x10E3/uL   Basophils Absolute 0.1 0.0 - 0.2 x10E3/uL   Immature Granulocytes 0 Not Estab. %   Immature Grans (Abs) 0.0 0.0 - 0.1 x10E3/uL  Comprehensive metabolic panel  Result Value Ref Range   Glucose 106 (H) 70 - 99 mg/dL   BUN 12 6 - 20 mg/dL   Creatinine, Ser 0.73 0.57 - 1.00 mg/dL   eGFR 107 >59 mL/min/1.73   BUN/Creatinine Ratio 16 9 - 23   Sodium 136 134 - 144 mmol/L   Potassium 4.6 3.5 - 5.2 mmol/L   Chloride 101 96 - 106 mmol/L   CO2 24 20 - 29 mmol/L   Calcium 10.0 8.7 - 10.2 mg/dL   Total Protein 7.0 6.0 - 8.5 g/dL   Albumin 4.5 3.9 - 4.9 g/dL   Globulin, Total 2.5 1.5 - 4.5 g/dL   Albumin/Globulin Ratio 1.8 1.2 - 2.2   Bilirubin Total 0.3 0.0 - 1.2 mg/dL   Alkaline Phosphatase 43 (L) 44 - 121 IU/L   AST 15 0 - 40 IU/L   ALT 23 0 - 32 IU/L  Lipid panel  Result Value Ref Range   Cholesterol, Total 285 (H) 100 - 199 mg/dL   Triglycerides 206 (H) 0 - 149 mg/dL   HDL 38 (L) >39 mg/dL   VLDL Cholesterol Cal 40 5 - 40 mg/dL   LDL Chol Calc (NIH) 207 (H) 0 - 99 mg/dL   Lipid Comment: Comment    Chol/HDL Ratio 7.5 (H) 0.0 - 4.4 ratio  Urinalysis, Routine w reflex microscopic  Result Value  Ref Range   Specific Gravity, UA 1.025 1.005 - 1.030   pH, UA 5.5 5.0 - 7.5   Color, UA Yellow Yellow   Appearance Ur Clear Clear   Leukocytes,UA Negative Negative   Protein,UA Negative Negative/Trace   Glucose, UA Negative Negative   Ketones, UA Negative Negative   RBC, UA Negative Negative   Bilirubin, UA Negative Negative   Urobilinogen, Ur 0.2 0.2 - 1.0 mg/dL   Nitrite, UA  Negative Negative  Vitamin D (25 hydroxy)  Result Value Ref Range   Vit D, 25-Hydroxy 30.4 30.0 - 100.0 ng/mL  B12  Result Value Ref Range   Vitamin B-12 531 232 - 1,245 pg/mL  Estrogens, Total  Result Value Ref Range   Estrogen 147 pg/mL  Progesterone  Result Value Ref Range   Progesterone 0.4 ng/mL  FSH/LH  Result Value Ref Range   LH 11.8 mIU/mL   FSH 4.2 mIU/mL  Thyroid Panel With TSH  Result Value Ref Range   TSH 1.370 0.450 - 4.500 uIU/mL   T4, Total 8.0 4.5 - 12.0 ug/dL   T3 Uptake Ratio 29 24 - 39 %   Free Thyroxine Index 2.3 1.2 - 4.9  Thyroid peroxidase antibody  Result Value Ref Range   Thyroperoxidase Ab SerPl-aCnc 12 0 - 34 IU/mL      Assessment & Plan:   Problem List Items Addressed This Visit       Other   Hyperlipidemia    Chronic.  Controlled.  Continue with current medication regimen.  Labs ordered today.  Return to clinic in 6 months for reevaluation.  Call sooner if concerns arise.        Relevant Orders   Lipid Profile   Adjustment disorder with mixed anxiety and depressed mood - Primary    Chronic. Not well controlled.  Depression has been more situational.  Recommend continuing with therapy and seeing if symptoms improve over the next two months.  If not improved at follow up can consider restarting wellbutrin.  Follow up in 2 months.  Call sooner if concerns arise.       Relevant Orders   Comp Met (CMET)   Chronic fatigue    Vitamin D in July was 30.4.  Will recheck vitamin D at visit today.        Relevant Orders   Vitamin D (25 hydroxy)     Follow up plan: Return in about 2 months (around 09/21/2022) for Depression/Anxiety FU (virtual).

## 2022-07-23 NOTE — Assessment & Plan Note (Signed)
Chronic.  Controlled.  Continue with current medication regimen.  Labs ordered today.  Return to clinic in 6 months for reevaluation.  Call sooner if concerns arise.  ? ?

## 2022-07-23 NOTE — Assessment & Plan Note (Signed)
Chronic. Not well controlled.  Depression has been more situational.  Recommend continuing with therapy and seeing if symptoms improve over the next two months.  If not improved at follow up can consider restarting wellbutrin.  Follow up in 2 months.  Call sooner if concerns arise.

## 2022-07-24 LAB — COMPREHENSIVE METABOLIC PANEL
ALT: 17 IU/L (ref 0–32)
AST: 14 IU/L (ref 0–40)
Albumin/Globulin Ratio: 2.6 — ABNORMAL HIGH (ref 1.2–2.2)
Albumin: 4.9 g/dL (ref 3.9–4.9)
Alkaline Phosphatase: 42 IU/L — ABNORMAL LOW (ref 44–121)
BUN/Creatinine Ratio: 15 (ref 9–23)
BUN: 13 mg/dL (ref 6–24)
Bilirubin Total: 0.6 mg/dL (ref 0.0–1.2)
CO2: 24 mmol/L (ref 20–29)
Calcium: 9.4 mg/dL (ref 8.7–10.2)
Chloride: 100 mmol/L (ref 96–106)
Creatinine, Ser: 0.84 mg/dL (ref 0.57–1.00)
Globulin, Total: 1.9 g/dL (ref 1.5–4.5)
Glucose: 96 mg/dL (ref 70–99)
Potassium: 3.7 mmol/L (ref 3.5–5.2)
Sodium: 139 mmol/L (ref 134–144)
Total Protein: 6.8 g/dL (ref 6.0–8.5)
eGFR: 90 mL/min/{1.73_m2} (ref >59)

## 2022-07-24 LAB — VITAMIN D 25 HYDROXY (VIT D DEFICIENCY, FRACTURES): Vit D, 25-Hydroxy: 39.4 ng/mL (ref 30.0–100.0)

## 2022-07-24 LAB — LIPID PANEL
Chol/HDL Ratio: 7 ratio — ABNORMAL HIGH (ref 0.0–4.4)
Cholesterol, Total: 274 mg/dL — ABNORMAL HIGH (ref 100–199)
HDL: 39 mg/dL — ABNORMAL LOW (ref >39)
LDL Chol Calc (NIH): 193 mg/dL — ABNORMAL HIGH (ref 0–99)
Triglycerides: 216 mg/dL — ABNORMAL HIGH (ref 0–149)
VLDL Cholesterol Cal: 42 mg/dL — ABNORMAL HIGH (ref 5–40)

## 2022-07-26 NOTE — Progress Notes (Signed)
Hi Shannon Davidson. It was nice to see you last week.  Your lab work looks good.  Vitamin D is elevated which is good.  Continue with your vitamin D supplement.  Cholesterol is improved some.  I don't anticipate a lot of improvement with diet because it does appear to be familial hypercholesterolemia.  No other concerns at this time. Continue with your current medication regimen.  Follow up as discussed.  Please let me know if you have any questions.

## 2022-08-10 DIAGNOSIS — M5033 Other cervical disc degeneration, cervicothoracic region: Secondary | ICD-10-CM | POA: Diagnosis not present

## 2022-08-10 DIAGNOSIS — M5412 Radiculopathy, cervical region: Secondary | ICD-10-CM | POA: Diagnosis not present

## 2022-08-10 DIAGNOSIS — M9901 Segmental and somatic dysfunction of cervical region: Secondary | ICD-10-CM | POA: Diagnosis not present

## 2022-08-10 DIAGNOSIS — M9902 Segmental and somatic dysfunction of thoracic region: Secondary | ICD-10-CM | POA: Diagnosis not present

## 2022-08-23 ENCOUNTER — Ambulatory Visit: Payer: Self-pay | Admitting: *Deleted

## 2022-08-23 NOTE — Telephone Encounter (Signed)
Called to offer pt next available appointment or telehealth through [facility]. Providers are unable to send in medication without an appointment.

## 2022-08-23 NOTE — Telephone Encounter (Signed)
Second attempt to reach pt, left VM to call back.

## 2022-08-23 NOTE — Telephone Encounter (Signed)
can't stop sneezing   Patient called in . She is at work and can't stop sneezing. She is asking for Z pac or something to help. No appt until feb 14.

## 2022-08-23 NOTE — Telephone Encounter (Signed)
Summary: can't stop sneezing   Patient called in . She is at work and can't stop sneezing. She is asking for Z pac or something to help. No appt until feb 14.          3rd attempt to contact patient #[phone] to review sx of sneezing. No answer LVMTCB [phone].

## 2022-08-24 ENCOUNTER — Encounter: Payer: Self-pay | Admitting: Nurse Practitioner

## 2022-08-24 ENCOUNTER — Ambulatory Visit
Admission: EM | Admit: 2022-08-24 | Discharge: 2022-08-24 | Disposition: A | Discharge status: Home / Self Care | Payer: 59 | Attending: Urgent Care | Admitting: Urgent Care

## 2022-08-24 ENCOUNTER — Telehealth: Payer: Self-pay

## 2022-08-24 ENCOUNTER — Encounter: Payer: Self-pay | Admitting: Oncology

## 2022-08-24 ENCOUNTER — Other Ambulatory Visit: Payer: Self-pay

## 2022-08-24 DIAGNOSIS — N3001 Acute cystitis with hematuria: Secondary | ICD-10-CM

## 2022-08-24 LAB — POCT URINALYSIS DIP (MANUAL ENTRY)
Bilirubin, UA: NEGATIVE
Glucose, UA: NEGATIVE mg/dL
Ketones, POC UA: NEGATIVE mg/dL
Nitrite, UA: NEGATIVE
Protein Ur, POC: NEGATIVE mg/dL
Spec Grav, UA: <=1.005 — AB (ref 1.010–1.025)
Urobilinogen, UA: 0.2 E.U./dL
pH, UA: 6 (ref 5.0–8.0)

## 2022-08-24 MED ORDER — NITROFURANTOIN MONOHYD MACRO 100 MG PO CAPS
100.0000 mg | ORAL_CAPSULE | Freq: Two times a day (BID) | ORAL | 0 refills | Status: DC
  Filled 2022-08-24: qty 10, 5d supply, fill #0

## 2022-08-24 MED ORDER — NITROFURANTOIN MONOHYD MACRO 100 MG PO CAPS: 100.0000 mg | ORAL_CAPSULE | Freq: Two times a day (BID) | ORAL | 0 refills | Status: DC

## 2022-08-24 NOTE — ED Provider Notes (Signed)
[facility]    CSN: [id number] Arrival date & time: 08/24/22  1621      History   Chief Complaint No chief complaint on file.   HPI Shannon Davidson is a 42 y.o. female.   HPI  Patient presents to urgent care with complaint of urinary frequency and urgency starting today.  Past Medical History:  Diagnosis Date   Babesiasis    Bartonella infection    Elevated EBV antibody titer    History of kidney stones    HLD (hyperlipidemia)    Kidney stone    Dr. Bernardo Heater   Lyme disease    Palpitations    Pap smear for cervical cancer screening 2012   Dr. Benjie Karvonen   PCOS (polycystic ovarian syndrome)    Relapsing remitting multiple sclerosis ([place]) 11/25/2014   Patient says that all her MS symptoms went away in 2017-2018 after she was treated for tick-bourne illnesses    Patient Active Problem List   Diagnosis Date Noted   Dizziness 01/28/2022   Obesity (BMI 30.0-34.9) 06/10/2021   Recurrent nephrolithiasis 01/02/2021   Chronic fatigue 06/18/2015   HSV infection 01/20/2015   Chronic maxillary sinusitis 01/20/2015   Relapsing remitting multiple sclerosis ([place]) 11/25/2014   Routine general medical examination at a health care facility 06/17/2014   Low back pain 06/17/2014   Elevated LFTs 02/20/2014   Arthralgia 02/20/2014   Adjustment disorder with mixed anxiety and depressed mood 05/25/2013   PCOS (polycystic ovarian syndrome) 09/09/2011   Hyperlipidemia 08/19/2009   HEADACHE 08/19/2009   PALPITATIONS 08/19/2009   MURMUR 08/19/2009    Past Surgical History:  Procedure Laterality Date   APPENDECTOMY  09/2019   CESAREAN SECTION  2008   CYSTOSCOPY/URETEROSCOPY/HOLMIUM LASER/STENT PLACEMENT Left 12/02/2020   Procedure: CYSTOSCOPY/URETEROSCOPY/HOLMIUM LASER/STENT EXCHANGE;  Surgeon: Abbie Sons, MD;  Location: [facility];  Service: Urology;  Laterality: Left;   LITHOTRIPSY     TONSILLECTOMY N/A 05/20/2016   Procedure: TONSILLECTOMY;  Surgeon: Margaretha Sheffield, MD;   Location: [facility];  Service: ENT;  Laterality: N/A;   TONSILLECTOMY      OB History   No obstetric history on file.      Home Medications    Prior to Admission medications   Medication Sig Start Date End Date Taking? Authorizing Provider  ALPRAZolam (XANAX) 0.25 MG tablet Take 1 tablet (0.25 mg total) by mouth 2 (two) times daily as needed for anxiety. 06/02/22   Jon Billings, NP    Family History Family History  Problem Relation Age of Onset   Heart disease Maternal Grandmother 23       s/p CABG   Dementia Maternal Grandmother    Hypertension Mother    Seizures Father    Hyperlipidemia Father    Achalasia Sister    COPD Maternal Grandfather    Diabetes Paternal Grandmother     Social History Social History   Tobacco Use   Smoking status: Never   Smokeless tobacco: Never  Vaping Use   Vaping Use: Never used  Substance Use Topics   Alcohol use: Yes    Comment: social - maybe 3 glasses of wine/month    Drug use: No     Allergies   Egg white (egg protein) and Influenza vaccines   Review of Systems Review of Systems   Physical Exam Triage Vital Signs ED Triage Vitals  Enc Vitals Group     BP      Pulse      Resp  Temp      Temp src      SpO2      Weight      Height      Head Circumference      Peak Flow      Pain Score      Pain Loc      Pain Edu?      Excl. in [place]?    No data found.  Updated Vital Signs There were no vitals taken for this visit.  Visual Acuity Right Eye Distance:   Left Eye Distance:   Bilateral Distance:    Right Eye Near:   Left Eye Near:    Bilateral Near:     Physical Exam   UC Treatments / Results  Labs (all labs ordered are listed, but only abnormal results are displayed) Labs Reviewed  POCT URINALYSIS DIP (MANUAL ENTRY) - Abnormal; Notable for the following components:      Result Value   Spec Grav, UA <=1.005 (*)    Blood, UA moderate (*)    Leukocytes, UA Small (1+) (*)    All  other components within normal limits    EKG   Radiology No results found.  Procedures Procedures (including critical care time)  Medications Ordered in UC Medications - No data to display  Initial Impression / Assessment and Plan / UC Course  I have reviewed the triage vital signs and the nursing notes.  Pertinent labs & imaging results that were available during my care of the patient were reviewed by me and considered in my medical decision making (see chart for details).   UA is indicative or UTI and will treat with macrobid.   Final Clinical Impressions(s) / UC Diagnoses   Final diagnoses:  None   Discharge Instructions   None    ED Prescriptions   None    PDMP not reviewed this encounter.   Rose Phi, [place] 08/24/22 1942

## 2022-08-24 NOTE — ED Triage Notes (Signed)
Patient presents to UC to rule out UTI. C/o of urinary freq and urgency since today. Hx of UTIs.

## 2022-08-24 NOTE — Telephone Encounter (Signed)
Patient called to have prescription sent to [facility], [place] location.

## 2022-08-24 NOTE — Discharge Instructions (Signed)
Follow up here or with your primary care provider if your symptoms are worsening or not improving with treatment.     

## 2022-08-25 ENCOUNTER — Encounter: Payer: Self-pay | Admitting: Urology

## 2022-08-26 ENCOUNTER — Ambulatory Visit
Admission: RE | Admit: 2022-08-26 | Discharge: 2022-08-26 | Disposition: A | Discharge status: Home / Self Care | Payer: 59 | Attending: Urology | Admitting: Urology

## 2022-08-26 ENCOUNTER — Encounter: Payer: Self-pay | Admitting: Oncology

## 2022-08-26 ENCOUNTER — Ambulatory Visit
Admission: RE | Admit: 2022-08-26 | Discharge: 2022-08-26 | Disposition: A | Discharge status: Home / Self Care | Payer: 59 | Source: Ambulatory Visit | Attending: Urology | Admitting: Urology

## 2022-08-26 DIAGNOSIS — Z87442 Personal history of urinary calculi: Secondary | ICD-10-CM

## 2022-08-30 ENCOUNTER — Encounter: Payer: Self-pay | Admitting: Urology

## 2022-08-31 ENCOUNTER — Encounter: Payer: Self-pay | Admitting: Oncology

## 2022-08-31 ENCOUNTER — Ambulatory Visit (INDEPENDENT_AMBULATORY_CARE_PROVIDER_SITE_OTHER): Payer: 59

## 2022-08-31 DIAGNOSIS — B351 Tinea unguium: Secondary | ICD-10-CM

## 2022-08-31 NOTE — Progress Notes (Signed)
Patient presents today for the 5th laser treatment. Diagnosed with mycotic nail infection by Dr. Posey Pronto.   Toenail most affected bilateral hallux.  All other systems are negative.  Nails were filed thin. Laser therapy was administered to bilateral hallux.  patient tolerated the treatment well. All safety precautions were in place.      Follow up in 8 weeks for laser # 6.

## 2022-09-03 ENCOUNTER — Other Ambulatory Visit: Payer: Self-pay

## 2022-09-03 ENCOUNTER — Ambulatory Visit: Payer: 59 | Admitting: Urology

## 2022-09-03 ENCOUNTER — Ambulatory Visit (INDEPENDENT_AMBULATORY_CARE_PROVIDER_SITE_OTHER): Payer: 59 | Admitting: Physician Assistant

## 2022-09-03 VITALS — BP 136/80 | HR 101 | Ht 65.0 in | Wt 170.0 lb

## 2022-09-03 DIAGNOSIS — Z87442 Personal history of urinary calculi: Secondary | ICD-10-CM

## 2022-09-03 DIAGNOSIS — R350 Frequency of micturition: Secondary | ICD-10-CM | POA: Diagnosis not present

## 2022-09-03 LAB — URINALYSIS, COMPLETE
Bilirubin, UA: NEGATIVE
Glucose, UA: NEGATIVE
Leukocytes,UA: NEGATIVE
Nitrite, UA: NEGATIVE
Protein,UA: NEGATIVE
RBC, UA: NEGATIVE
Specific Gravity, UA: 1.015 (ref 1.005–1.030)
Urobilinogen, Ur: 0.2 mg/dL (ref 0.2–1.0)
pH, UA: 5 (ref 5.0–7.5)

## 2022-09-03 LAB — MICROSCOPIC EXAMINATION

## 2022-09-03 MED ORDER — TAMSULOSIN HCL 0.4 MG PO CAPS
0.4000 mg | ORAL_CAPSULE | Freq: Every day | ORAL | 0 refills | Status: DC
  Filled 2022-09-03: qty 30, 30d supply, fill #0

## 2022-09-03 NOTE — Progress Notes (Signed)
09/03/2022 1:10 PM   Shannon Davidson 01/15/1981 [id number]  CC: Chief Complaint  Patient presents with   Nephrolithiasis   HPI: Shannon Davidson is a 42 y.o. female with PMH nephrolithiasis who presents today for annual follow-up.   Today she reports no stone episodes for the past year until about 2 weeks ago, when she developed dull lower abdominal aching with intense irritative voiding symptoms.  She saw urgent care, and a UA dip resulted with moderate blood and 1+ leukocytes, but no culture was sent.  She was treated with Macrobid for 5 days.  Despite antibiotics, she continues to have bladder spasms and discomfort and states the symptoms are consistent with past stone episodes.  KUB today with no apparent urolithiasis.  LMP 2 weeks ago.  In-office UA today positive for 1+ ketones; urine microscopy pan negative.  PMH: Past Medical History:  Diagnosis Date   Babesiasis    Bartonella infection    Elevated EBV antibody titer    History of kidney stones    HLD (hyperlipidemia)    Kidney stone    Dr. Lonna Cobb   Lyme disease    Palpitations    Pap smear for cervical cancer screening 2012   Dr. Juliene Pina   PCOS (polycystic ovarian syndrome)    Relapsing remitting multiple sclerosis (HCC) 11/25/2014   Patient says that all her MS symptoms went away in 2017-2018 after she was treated for tick-bourne illnesses    Surgical History: Past Surgical History:  Procedure Laterality Date   APPENDECTOMY  09/2019   CESAREAN SECTION  2008   CYSTOSCOPY/URETEROSCOPY/HOLMIUM LASER/STENT PLACEMENT Left 12/02/2020   Procedure: CYSTOSCOPY/URETEROSCOPY/HOLMIUM LASER/STENT EXCHANGE;  Surgeon: Riki Altes, MD;  Location: [facility];  Service: Urology;  Laterality: Left;   LITHOTRIPSY     TONSILLECTOMY N/A 05/20/2016   Procedure: TONSILLECTOMY;  Surgeon: Vernie Murders, MD;  Location: [facility];  Service: ENT;  Laterality: N/A;   TONSILLECTOMY      Home Medications:  Allergies as of  09/03/2022       Reactions   Egg White (egg Protein) Swelling   Influenza Vaccines Swelling        Medication List        Accurate as of September 03, 2022  1:10 PM. If you have any questions, ask your nurse or doctor.          STOP taking these medications    nitrofurantoin (macrocrystal-monohydrate) 100 MG capsule Commonly known as: MACROBID       TAKE these medications    ALPRAZolam 0.25 MG tablet Commonly known as: XANAX Take 1 tablet (0.25 mg total) by mouth 2 (two) times daily as needed for anxiety.        Allergies:  Allergies  Allergen Reactions   Egg White (Egg Protein) Swelling   Influenza Vaccines Swelling    Family History: Family History  Problem Relation Age of Onset   Heart disease Maternal Grandmother 9       s/p CABG   Dementia Maternal Grandmother    Hypertension Mother    Seizures Father    Hyperlipidemia Father    Achalasia Sister    COPD Maternal Grandfather    Diabetes Paternal Grandmother     Social History:   reports that she has never smoked. She has never used smokeless tobacco. She reports current alcohol use. She reports that she does not use drugs.  Physical Exam: BP 136/80   Pulse (!) 101   Ht 5\' 5"  (1.651  m)   Wt 170 lb (77.1 kg)   LMP 08/11/2022 (Approximate)   BMI 28.29 kg/m   Constitutional:  Alert and oriented, no acute distress, nontoxic appearing HEENT: [place], AT Cardiovascular: No clubbing, cyanosis, or edema Respiratory: Normal respiratory effort, no increased work of breathing Skin: No rashes, bruises or suspicious lesions Neurologic: Grossly intact, no focal deficits, moving all 4 extremities Psychiatric: Normal mood and affect  Laboratory Data: Results for orders placed or performed in visit on 09/03/22  Microscopic Examination   Urine  Result Value Ref Range   WBC, UA 0-5 0 - 5 /hpf   RBC, Urine 0-2 0 - 2 /hpf   Epithelial Cells (non renal) 0-10 0 - 10 /hpf   Bacteria, UA Few None seen/Few   Urinalysis, Complete  Result Value Ref Range   Specific Gravity, UA 1.015 1.005 - 1.030   pH, UA 5.0 5.0 - 7.5   Color, UA Yellow Yellow   Appearance Ur Clear Clear   Leukocytes,UA Negative Negative   Protein,UA Negative Negative/Trace   Glucose, UA Negative Negative   Ketones, UA 1+ (A) Negative   RBC, UA Negative Negative   Bilirubin, UA Negative Negative   Urobilinogen, Ur 0.2 0.2 - 1.0 mg/dL   Nitrite, UA Negative Negative   Microscopic Examination See below:    Pertinent Imaging: Results for orders placed during the hospital encounter of 08/26/22  Abdomen 1 view (KUB)  Narrative CLINICAL DATA:  Nephrolithiasis, recent hematuria  EXAM: ABDOMEN - 1 VIEW  COMPARISON:  09/04/2021  FINDINGS: The bowel gas pattern is normal. No radio-opaque calculi or other significant radiographic abnormality are seen. Clear lung bases. No osseous abnormality.  IMPRESSION: No acute or significant finding by plain radiography.   Electronically Signed By: Judie Petit.  Shick M.D. On: 08/30/2022 08:11  I personally reviewed the images referenced above and note no radiopaque urolithiasis.  Assessment & Plan:   1. Frequent urination Recent UA only mildly suspicious for UTI, but no culture to confirm.  Her continued bladder discomfort could reflect postinfectious bladder irritation, but her UA is bland and I have low concern for persistent versus recurrent UTI at this time.  More likely is acute stone episode not visualized on KUB, see below. - Urinalysis, Complete - CT RENAL STONE STUDY; Future  2. History of nephrolithiasis She has a history of stone episodes not well-evaluated on KUB.  We discussed pursuing further imaging with renal ultrasound versus CT stone study and in shared decision making we elected to proceed with CT stone study.  Will start her on empiric Flomax in the interim.  We discussed return precautions including fever, uncontrolled pain, and uncontrollable nausea vomiting. -  CT RENAL STONE STUDY; Future - tamsulosin (FLOMAX) 0.4 MG CAPS capsule; Take 1 capsule (0.4 mg total) by mouth daily.  Dispense: 30 capsule; Refill: 0  Return for will call with results.  Carman Ching, PA-C  [facility] [address] [place], [place] [address] [phone]

## 2022-09-07 ENCOUNTER — Ambulatory Visit
Admission: RE | Admit: 2022-09-07 | Discharge: 2022-09-07 | Disposition: A | Discharge status: Home / Self Care | Payer: 59 | Source: Ambulatory Visit | Attending: Physician Assistant | Admitting: Physician Assistant

## 2022-09-07 DIAGNOSIS — M5412 Radiculopathy, cervical region: Secondary | ICD-10-CM | POA: Diagnosis not present

## 2022-09-07 DIAGNOSIS — Z87442 Personal history of urinary calculi: Secondary | ICD-10-CM | POA: Diagnosis not present

## 2022-09-07 DIAGNOSIS — K2289 Other specified disease of esophagus: Secondary | ICD-10-CM | POA: Diagnosis not present

## 2022-09-07 DIAGNOSIS — M5033 Other cervical disc degeneration, cervicothoracic region: Secondary | ICD-10-CM | POA: Diagnosis not present

## 2022-09-07 DIAGNOSIS — M9901 Segmental and somatic dysfunction of cervical region: Secondary | ICD-10-CM | POA: Diagnosis not present

## 2022-09-07 DIAGNOSIS — M9902 Segmental and somatic dysfunction of thoracic region: Secondary | ICD-10-CM | POA: Diagnosis not present

## 2022-09-07 DIAGNOSIS — N200 Calculus of kidney: Secondary | ICD-10-CM | POA: Diagnosis not present

## 2022-09-07 DIAGNOSIS — R350 Frequency of micturition: Secondary | ICD-10-CM | POA: Diagnosis not present

## 2022-09-23 ENCOUNTER — Other Ambulatory Visit: Payer: Self-pay

## 2022-10-05 ENCOUNTER — Encounter: Payer: Self-pay | Admitting: Oncology

## 2022-10-05 ENCOUNTER — Other Ambulatory Visit: Payer: Self-pay

## 2022-10-05 ENCOUNTER — Telehealth (INDEPENDENT_AMBULATORY_CARE_PROVIDER_SITE_OTHER): Payer: 59 | Admitting: Nurse Practitioner

## 2022-10-05 ENCOUNTER — Encounter: Payer: Self-pay | Admitting: Nurse Practitioner

## 2022-10-05 ENCOUNTER — Ambulatory Visit: Payer: Self-pay

## 2022-10-05 DIAGNOSIS — M5033 Other cervical disc degeneration, cervicothoracic region: Secondary | ICD-10-CM | POA: Diagnosis not present

## 2022-10-05 DIAGNOSIS — G8929 Other chronic pain: Secondary | ICD-10-CM

## 2022-10-05 DIAGNOSIS — M9902 Segmental and somatic dysfunction of thoracic region: Secondary | ICD-10-CM | POA: Diagnosis not present

## 2022-10-05 DIAGNOSIS — M25511 Pain in right shoulder: Secondary | ICD-10-CM

## 2022-10-05 DIAGNOSIS — M9901 Segmental and somatic dysfunction of cervical region: Secondary | ICD-10-CM | POA: Diagnosis not present

## 2022-10-05 DIAGNOSIS — M5412 Radiculopathy, cervical region: Secondary | ICD-10-CM | POA: Diagnosis not present

## 2022-10-05 MED ORDER — IBUPROFEN 800 MG PO TABS
800.0000 mg | ORAL_TABLET | Freq: Three times a day (TID) | ORAL | 1 refills | Status: DC | PRN
  Filled 2022-10-05: qty 30, 10d supply, fill #0

## 2022-10-05 NOTE — Telephone Encounter (Signed)
  Chief Complaint: Right shoulder pain Symptoms: above Frequency: long term Pertinent Negatives: Patient denies fever Disposition: [] ED /[] Urgent Care (no appt availability in office) / [x] Appointment(In office/virtual)/ []  [place] [facility]/ [] Home Care/ [] Refused Recommended Disposition /[] [facility] [facility]/ []  Follow-up with PCP Additional Notes: PT states that her shoulder has hurt for a very long time. Pt sees a chiropractor for help. PT would like anti inflammatory  IBU or meloxicam for pain.   Summary: right shoulder pain   Pt stated that she is having right shoulder pain that she is seeing a chiropractor for. Stated has been going on for about four weeks. Pain is 6 out of 10. Stated has dealt with this before, but it's gotten worse. Chiropractor suggested an anti-inflammatory.   Pt is requesting medication Meloxicam be sent in declined to schedule an appointment in-person, requesting virtual if she needs an appointment. Pt would like to ask Santiago Glad for medication first.    Pt seeking clinical advice.     Reason for Disposition  [1] MODERATE pain (e.g., interferes with normal activities) AND [2] present > 3 days  Answer Assessment - Initial Assessment Questions 1. ONSET: "When did the pain start?"     4 weeks 2. LOCATION: "Where is the pain located?"     Right shoulder 3. PAIN: "How bad is the pain?" (Scale 1-10; or mild, moderate, severe)   - MILD (1-3): doesn't interfere with normal activities   - MODERATE (4-7): interferes with normal activities (e.g., work or school) or awakens from sleep   - SEVERE (8-10): excruciating pain, unable to do any normal activities, unable to move arm at all due to pain     6/10 4. WORK OR EXERCISE: "Has there been any recent work or exercise that involved this part of the body?"     Working 12 hours a day 5. CAUSE: "What do you think is causing the shoulder pain?"     Chronic pain 6. OTHER SYMPTOMS: "Do you have any other  symptoms?" (e.g., neck pain, swelling, rash, fever, numbness, weakness)     no  Protocols used: Shoulder Pain-A-AH

## 2022-10-05 NOTE — Progress Notes (Signed)
LMP 09/07/2022 (Approximate)    Subjective:    Patient ID: Shannon Davidson, female    DOB: 01-Sep-1980, 42 y.o.   MRN: [id number]  HPI: Shannon Davidson is a 42 y.o. female  Chief Complaint  Patient presents with   Shoulder Pain    Patient states she has been having R shoulder pain for the last 4 weeks. 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 Patient states that she feels a dull achy pain all the time and gets worse when moving the shoulder.    SHOULDER PAIN Duration: weeks Involved shoulder: right Mechanism of injury: unknown Location: diffuse Onset:gradual Severity: moderate  Quality:  aching Frequency: constant Radiation: no Aggravating factors: movement  Alleviating factors: Davidson and NSAIDs  Status: stable Treatments attempted: ibuprofen  Relief with NSAIDs?:  moderate Weakness: yes Numbness: no Decreased grip strength: no Redness: no Swelling: no Bruising: no Fevers: no  Relevant past medical, surgical, family and social history reviewed and updated as indicated. Interim medical history since our last visit reviewed. Allergies and medications reviewed and updated.  Review of Systems  Musculoskeletal:        Right shoulder pain    Per HPI unless specifically indicated above     Objective:    LMP 09/07/2022 (Approximate)   Wt Readings from Last 3 Encounters:  09/03/22 170 lb (77.1 kg)  07/23/22 172 lb 1.6 oz (78.1 kg)  01/28/22 183 lb 9.6 oz (83.3 kg)    Physical Exam Vitals and nursing note reviewed.  Constitutional:      General: She is not in acute distress.    Appearance: She is not ill-appearing.  HENT:     Head: Normocephalic.     Right Ear: Hearing normal.     Left Ear: Hearing normal.     Nose: Nose normal.  Pulmonary:     Effort: Pulmonary effort is normal. No respiratory distress.  Neurological:     Mental Status: She is alert.  Psychiatric:        Mood and Affect: Mood normal.        Behavior: Behavior  normal.        Thought Content: Thought content normal.        Judgment: Judgment normal.     Results for orders placed or performed in visit on 09/03/22  Microscopic Examination   Urine  Result Value Ref Range   WBC, UA 0-5 0 - 5 /hpf   RBC, Urine 0-2 0 - 2 /hpf   Epithelial Cells (non renal) 0-10 0 - 10 /hpf   Bacteria, UA Few None seen/Few  Urinalysis, Complete  Result Value Ref Range   Specific Gravity, UA 1.015 1.005 - 1.030   pH, UA 5.0 5.0 - 7.5   Color, UA Yellow Yellow   Appearance Ur Clear Clear   Leukocytes,UA Negative Negative   Protein,UA Negative Negative/Trace   Glucose, UA Negative Negative   Ketones, UA 1+ (A) Negative   RBC, UA Negative Negative   Bilirubin, UA Negative Negative   Urobilinogen, Ur 0.2 0.2 - 1.0 mg/dL   Nitrite, UA Negative Negative   Microscopic Examination See below:       Assessment & Plan:   Problem List Items Addressed This Visit   None Visit Diagnoses     Chronic right shoulder pain    -  Primary   Ongoing x several years.  Flared up at the moment. Recommend Ibuprofen 800mg  PRN for pain.  Can  do xray or change to meloxicam if needed. FU if not improved.   Relevant Medications   ibuprofen (ADVIL) 800 MG tablet        Follow up plan: Return if symptoms worsen or fail to improve.   This visit was completed via MyChart due to the restrictions of the COVID-19 pandemic. All issues as above were discussed and addressed. Physical exam was done as above through visual confirmation on MyChart. If it was felt that the patient should be evaluated in the office, they were directed there. The patient verbally consented to this visit. Location of the patient: Home Location of the provider: Office Those involved with this call:  Provider: Jon Billings, NP CMA: Yvonna Alanis, CMA Front Desk/Registration: Lynnell Catalan This encounter was conducted via video.  I spent 20 dedicated to the care of this patient on the date of this  encounter to include previsit review of symptoms, plan of care and follow up, face to face time with the patient, and post visit ordering of testing.

## 2022-10-12 NOTE — Addendum Note (Signed)
Addended by: Geni Bers on: 10/12/2022 09:37 AM   Modules accepted: Level of Service

## 2022-10-29 ENCOUNTER — Ambulatory Visit (INDEPENDENT_AMBULATORY_CARE_PROVIDER_SITE_OTHER): Payer: 59 | Admitting: *Deleted

## 2022-10-29 DIAGNOSIS — B351 Tinea unguium: Secondary | ICD-10-CM

## 2022-10-29 NOTE — Progress Notes (Signed)
Patient presents today for the 6th laser treatment. Diagnosed with mycotic nail infection by Dr. Allena Katz.   Toenail most affected bilateral hallux. The nails are looking much better and she is pleased with the progress.  All other systems are negative.  Nails were filed thin. Laser therapy was administered to bilateral hallux.  patient tolerated the treatment well. All safety precautions were in place.    Patient has completed the recommended laser treatments. He will follow up with Dr. Allena Katz in 3 months to evaluate progress.

## 2022-11-08 ENCOUNTER — Encounter: Payer: Self-pay | Admitting: Oncology

## 2023-01-12 DIAGNOSIS — J029 Acute pharyngitis, unspecified: Secondary | ICD-10-CM | POA: Diagnosis not present

## 2023-01-27 ENCOUNTER — Encounter: Payer: Self-pay | Admitting: Nurse Practitioner

## 2023-01-27 NOTE — Telephone Encounter (Signed)
Attempted to reach patient to schedule appointment, LVM to call office back.  Put in CRM.

## 2023-01-31 NOTE — Progress Notes (Unsigned)
Chronic. Not well controlled.  Depression has been more situational.  Recommend continuing with therapy and seeing if symptoms improve over the next two months.  If not improved at follow up can consider restarting wellbutrin.  Follow up in 2 months.  Call sooner if concerns arise.

## 2023-02-01 ENCOUNTER — Other Ambulatory Visit: Payer: Self-pay

## 2023-02-01 ENCOUNTER — Encounter: Payer: Self-pay | Admitting: Family Medicine

## 2023-02-01 ENCOUNTER — Encounter: Payer: Self-pay | Admitting: Oncology

## 2023-02-01 ENCOUNTER — Ambulatory Visit: Payer: 59 | Admitting: Podiatry

## 2023-02-01 ENCOUNTER — Telehealth (INDEPENDENT_AMBULATORY_CARE_PROVIDER_SITE_OTHER): Payer: 59 | Admitting: Family Medicine

## 2023-02-01 DIAGNOSIS — F4323 Adjustment disorder with mixed anxiety and depressed mood: Secondary | ICD-10-CM

## 2023-02-01 MED ORDER — ALPRAZOLAM 0.25 MG PO TABS
0.2500 mg | ORAL_TABLET | Freq: Two times a day (BID) | ORAL | 0 refills | Status: DC | PRN
  Filled 2023-02-01: qty 20, 10d supply, fill #0

## 2023-02-01 NOTE — Assessment & Plan Note (Addendum)
Chronic. uncontrolled. PhQ9:7, GAD 7:16. Refill given for Xanax 0.25mg  BID PRN for panic attacks. Maintenance dose offered, patient declined at this time, but may be open to psychiatry in the future. Recommend continue seeing counselor monthly and use of crisis line if needed. Return to clinic in 3 months for reevaluation. Call sooner if concerns arise.

## 2023-02-03 ENCOUNTER — Ambulatory Visit: Payer: 59 | Admitting: Podiatry

## 2023-02-08 ENCOUNTER — Ambulatory Visit (INDEPENDENT_AMBULATORY_CARE_PROVIDER_SITE_OTHER): Payer: 59 | Admitting: Podiatry

## 2023-02-08 DIAGNOSIS — B351 Tinea unguium: Secondary | ICD-10-CM | POA: Diagnosis not present

## 2023-02-08 NOTE — Progress Notes (Signed)
  Subjective:  Patient ID: Shannon Davidson, female    DOB: Nov 10, 1980,  MRN: [id number]  Chief Complaint  Patient presents with   Nail Problem    Laser treatment follow up     42 y.o. female presents with the above complaint.  Patient presents for follow-up of bilateral hallux onychomycosis.  She says doing a lot better laser helped.  Review of Systems: Negative except as noted in the HPI. Denies N/V/F/Ch.  Past Medical History:  Diagnosis Date   Babesiasis    Bartonella infection    Elevated EBV antibody titer    History of kidney stones    HLD (hyperlipidemia)    Kidney stone    Dr. Lonna Cobb   Lyme disease    Palpitations    Pap smear for cervical cancer screening 2012   Dr. Juliene Pina   PCOS (polycystic ovarian syndrome)    Relapsing remitting multiple sclerosis (HCC) 11/25/2014   Patient says that all her MS symptoms went away in 2017-2018 after she was treated for tick-bourne illnesses    Current Outpatient Medications:    ALPRAZolam (XANAX) 0.25 MG tablet, Take 1 tablet (0.25 mg total) by mouth 2 (two) times daily as needed for anxiety (panic attacks)., Disp: 20 tablet, Rfl: 0   ibuprofen (ADVIL) 800 MG tablet, Take 1 tablet (800 mg total) by mouth every 8 (eight) hours as needed., Disp: 30 tablet, Rfl: 1  Social History   Tobacco Use  Smoking Status Never  Smokeless Tobacco Never    Allergies  Allergen Reactions   Egg White (Egg Protein) Swelling   Influenza Vaccines Swelling   Objective:  There were no vitals filed for this visit. There is no height or weight on file to calculate BMI. Constitutional Well developed. Well nourished.  Vascular Dorsalis pedis pulses palpable bilaterally. Posterior tibial pulses palpable bilaterally. Capillary refill normal to all digits.  No cyanosis or clubbing noted. Pedal hair growth normal.  Neurologic Normal speech. Oriented to person, place, and time. Epicritic sensation to light touch grossly present bilaterally.   Dermatologic Bilateral hallux onychomycosis completely resolved  Orthopedic: Normal joint ROM without pain or crepitus bilaterally. No visible deformities. No bony tenderness.   Radiographs: None Assessment:   No diagnosis found.  Plan:  Patient was evaluated and treated and all questions answered.  Bilateral hallux onychomycosis  clinically healed and doing well.  At this time if it regresses she will come back and see me.  Otherwise she is officially discharged from my care.  She states understanding  No follow-ups on file.

## 2023-02-22 ENCOUNTER — Encounter: Payer: Self-pay | Admitting: Family Medicine

## 2023-02-22 ENCOUNTER — Other Ambulatory Visit: Payer: Self-pay | Admitting: Family Medicine

## 2023-02-22 ENCOUNTER — Other Ambulatory Visit: Payer: Self-pay

## 2023-02-22 ENCOUNTER — Ambulatory Visit (INDEPENDENT_AMBULATORY_CARE_PROVIDER_SITE_OTHER): Payer: 59 | Admitting: Family Medicine

## 2023-02-22 VITALS — BP 125/87 | HR 62 | Temp 98.4°F | Ht 65.0 in | Wt 164.2 lb

## 2023-02-22 DIAGNOSIS — Z8249 Family history of ischemic heart disease and other diseases of the circulatory system: Secondary | ICD-10-CM

## 2023-02-22 DIAGNOSIS — N951 Menopausal and female climacteric states: Secondary | ICD-10-CM | POA: Diagnosis not present

## 2023-02-22 DIAGNOSIS — Z1231 Encounter for screening mammogram for malignant neoplasm of breast: Secondary | ICD-10-CM | POA: Diagnosis not present

## 2023-02-22 DIAGNOSIS — F419 Anxiety disorder, unspecified: Secondary | ICD-10-CM | POA: Diagnosis not present

## 2023-02-22 DIAGNOSIS — Z0000 Encounter for general adult medical examination without abnormal findings: Secondary | ICD-10-CM | POA: Diagnosis not present

## 2023-02-22 LAB — PREGNANCY, URINE: Preg Test, Ur: NEGATIVE

## 2023-02-22 MED ORDER — CLONAZEPAM 0.5 MG PO TABS
0.5000 mg | ORAL_TABLET | Freq: Every day | ORAL | 0 refills | Status: DC | PRN
  Filled 2023-02-22: qty 30, 30d supply, fill #0

## 2023-02-22 MED ORDER — HYDROXYZINE HCL 10 MG PO TABS
10.0000 mg | ORAL_TABLET | Freq: Three times a day (TID) | ORAL | 3 refills | Status: DC | PRN
  Filled 2023-02-22: qty 90, 30d supply, fill #0

## 2023-02-22 NOTE — Assessment & Plan Note (Signed)
Vaccines declined. Screening labs checked today. Pap up to date. Mammogram declined- will consider, so order placed. Continue diet and exercise. Call with any concerns.

## 2023-02-22 NOTE — Progress Notes (Signed)
BP 125/87   Pulse 62   Temp 98.4 F (36.9 C) (Oral)   Ht 5\' 5"  (1.651 m)   Wt 164 lb 3.2 oz (74.5 kg)   LMP 01/29/2023 (Approximate)   SpO2 98%   BMI 27.32 kg/m    Subjective:    Patient ID: Shannon Davidson, female    DOB: January 19, 1981, 42 y.o.   MRN: [id number]  HPI: Shannon Davidson is a 42 y.o. female presenting on 02/22/2023 for comprehensive medical examination. Current medical complaints include:  ANXIETY/STRESS- has had issues with anxiety in the past, has not been doing well in the past month and a half, this feels really different from what she's had before. Having a lot more panic attacks, have been having sadness and crying out of nowhere. She notes that she is having chills and sweats, she has been having issues with sleep, she has been having low energy Duration: chronic, worse in the last 1.5 months, she has used up all her xanax in the past 2 weeks.  Anxious mood: yes  Excessive worrying: yes Irritability: yes  Sweating: yes Nausea: yes Palpitations:yes Hyperventilation: yes Panic attacks: yes Agoraphobia: no  Obscessions/compulsions: no Depressed mood: yes    02/22/2023    8:49 AM 02/01/2023    1:14 PM 07/23/2022   10:09 AM 01/28/2022   11:21 AM 01/20/2022    8:02 AM  Depression screen PHQ 2/9  Decreased Interest 1 0 1 0 0  Down, Depressed, Hopeless 2 1 1  0 0  PHQ - 2 Score 3 1 2  0 0  Altered sleeping 3 3 1  0 0  Tired, decreased energy 3 1 1 2 1   Change in appetite 0 0 0 0 0  Feeling bad or failure about yourself  1 2 1  0 0  Trouble concentrating 3 0 0 2 1  Moving slowly or fidgety/restless 0 0 0 0 0  Suicidal thoughts 0 0 0 0 0  PHQ-9 Score 13 7 5 4 2   Difficult doing work/chores Very difficult Somewhat difficult Somewhat difficult Somewhat difficult Somewhat difficult      02/22/2023    8:49 AM 02/01/2023    1:16 PM 07/23/2022   10:09 AM 01/28/2022   11:21 AM  GAD 7 : Generalized Anxiety Score  Nervous, Anxious, on Edge 3 3 0 0  Control/stop worrying 3  3 0 0  Worry too much - different things 3 3 0 0  Trouble relaxing 3 3 0 2  Restless 1 1 0 0  Easily annoyed or irritable 2 2 0 1  Afraid - awful might happen 1 1 0 0  Total GAD 7 Score 16 16 0 3  Anxiety Difficulty Extremely difficult Somewhat difficult Not difficult at all Somewhat difficult   Anhedonia: no Weight changes: no Insomnia: yes hard to fall asleep  Hypersomnia: no Fatigue/loss of energy: yes Feelings of worthlessness: yes Feelings of guilt: yes Impaired concentration/indecisiveness: yes Suicidal ideations: no  Crying spells: yes Recent Stressors/Life Changes: no   Relationship problems: no   Family stress: no     Financial stress: no    Job stress: no    Recent death/loss: no  Menopausal Symptoms: yes  Depression Screen done today and results listed below:     02/22/2023    8:49 AM 02/01/2023    1:14 PM 07/23/2022   10:09 AM 01/28/2022   11:21 AM 01/20/2022    8:02 AM  Depression screen PHQ 2/9  Decreased Interest 1 0 1  0 0  Down, Depressed, Hopeless 2 1 1  0 0  PHQ - 2 Score 3 1 2  0 0  Altered sleeping 3 3 1  0 0  Tired, decreased energy 3 1 1 2 1   Change in appetite 0 0 0 0 0  Feeling bad or failure about yourself  1 2 1  0 0  Trouble concentrating 3 0 0 2 1  Moving slowly or fidgety/restless 0 0 0 0 0  Suicidal thoughts 0 0 0 0 0  PHQ-9 Score 13 7 5 4 2   Difficult doing work/chores Very difficult Somewhat difficult Somewhat difficult Somewhat difficult Somewhat difficult    Past Medical History:  Past Medical History:  Diagnosis Date   Babesiasis    Bartonella infection    Elevated EBV antibody titer    History of kidney stones    HLD (hyperlipidemia)    Kidney stone    Dr. Lonna Cobb   Lyme disease    Palpitations    Pap smear for cervical cancer screening 2012   Dr. Juliene Pina   PCOS (polycystic ovarian syndrome)    Relapsing remitting multiple sclerosis (HCC) 11/25/2014   Patient says that all her MS symptoms went away in 2017-2018 after she was  treated for tick-bourne illnesses    Surgical History:  Past Surgical History:  Procedure Laterality Date   APPENDECTOMY  09/2019   CESAREAN SECTION  2008   CYSTOSCOPY/URETEROSCOPY/HOLMIUM LASER/STENT PLACEMENT Left 12/02/2020   Procedure: CYSTOSCOPY/URETEROSCOPY/HOLMIUM LASER/STENT EXCHANGE;  Surgeon: Riki Altes, MD;  Location: [facility];  Service: Urology;  Laterality: Left;   LITHOTRIPSY     TONSILLECTOMY N/A 05/20/2016   Procedure: TONSILLECTOMY;  Surgeon: Vernie Murders, MD;  Location: [facility];  Service: ENT;  Laterality: N/A;   TONSILLECTOMY      Medications:  Current Outpatient Medications on File Prior to Visit  Medication Sig   ALPRAZolam (XANAX) 0.25 MG tablet Take 1 tablet (0.25 mg total) by mouth 2 (two) times daily as needed for anxiety (panic attacks). (Patient not taking: Reported on 02/22/2023)   ibuprofen (ADVIL) 800 MG tablet Take 1 tablet (800 mg total) by mouth every 8 (eight) hours as needed. (Patient not taking: Reported on 02/22/2023)   No current facility-administered medications on file prior to visit.    Allergies:  Allergies  Allergen Reactions   Egg White (Egg Protein) Swelling   Influenza Vaccines Swelling    Social History:  Social History   Socioeconomic History   Marital status: Single    Spouse name: Not on file   Number of children: 1   Years of education: Not on file   Highest education level: Not on file  Occupational History   Occupation: Journalist, newspaper GI    Employer: [employer]  Tobacco Use   Smoking status: Never   Smokeless tobacco: Never  Vaping Use   Vaping status: Never Used  Substance and Sexual Activity   Alcohol use: Yes    Comment: social - maybe 3 glasses of wine/month    Drug use: No   Sexual activity: Not Currently    Birth control/protection: None, Condom  Other Topics Concern   Not on file  Social History Narrative   Has one child.      Single, full time RN.    Social Determinants of Health    Financial Resource Strain: Low Risk  (09/18/2019)   Received from [facility], [facility]   Overall Financial Resource Strain (CARDIA)    Difficulty of Paying  Living Expenses: Not hard at all  Food Insecurity: No Food Insecurity (09/18/2019)   Received from [facility], [facility]   Hunger Vital Sign    Worried About Running Out of Food in the Last Year: Never true    Ran Out of Food in the Last Year: Never true  Transportation Needs: No Transportation Needs (09/18/2019)   Received from [facility], [facility]   [facility] - Transportation    Lack of Transportation (Medical): No    Lack of Transportation (Non-Medical): No  Physical Activity: Not on file  Stress: Stress Concern Present (09/18/2019)   Received from [facility], [facility]   [facility] - Occupational Stress Questionnaire    Feeling of Stress : To some extent  Social Connections: Not on file  Intimate Partner Violence: Not At Risk (09/18/2019)   Received from [facility], [facility]   Humiliation, Afraid, Rape, and Kick questionnaire    Fear of Current or Ex-Partner: No    Emotionally Abused: No    Physically Abused: No    Sexually Abused: No   Social History   Tobacco Use  Smoking Status Never  Smokeless Tobacco Never   Social History   Substance and Sexual Activity  Alcohol Use Yes   Comment: social - maybe 3 glasses of wine/month     Family History:  Family History  Problem Relation Age of Onset   Heart disease Maternal Grandmother 55       s/p CABG   Dementia Maternal Grandmother    Hypertension Mother    Seizures Father    Hyperlipidemia Father    Achalasia Sister    COPD Maternal Grandfather    Diabetes Paternal Grandmother     Past medical history, surgical history, medications, allergies, family history and social history reviewed with patient today and changes made to appropriate areas of the chart.   Review of Systems   Constitutional:  Positive for diaphoresis. Negative for chills, fever, malaise/fatigue and weight loss.  HENT: Negative.    Eyes: Negative.   Respiratory: Negative.    Cardiovascular:  Positive for palpitations. Negative for chest pain, orthopnea, claudication, leg swelling and PND.  Gastrointestinal: Negative.   Genitourinary: Negative.   Musculoskeletal: Negative.   Skin: Negative.   Neurological: Negative.   Endo/Heme/Allergies: Negative.   Psychiatric/Behavioral:  Positive for depression. Negative for hallucinations, memory loss, substance abuse and suicidal ideas. The patient is nervous/anxious. The patient does not have insomnia.    All other ROS negative except what is listed above and in the HPI.      Objective:    BP 125/87   Pulse 62   Temp 98.4 F (36.9 C) (Oral)   Ht 5\' 5"  (1.651 m)   Wt 164 lb 3.2 oz (74.5 kg)   LMP 01/29/2023 (Approximate)   SpO2 98%   BMI 27.32 kg/m   Wt Readings from Last 3 Encounters:  02/22/23 164 lb 3.2 oz (74.5 kg)  09/03/22 170 lb (77.1 kg)  07/23/22 172 lb 1.6 oz (78.1 kg)    Physical Exam Vitals and nursing note reviewed.  Constitutional:      General: She is not in acute distress.    Appearance: Normal appearance. She is not ill-appearing, toxic-appearing or diaphoretic.  HENT:     Head: Normocephalic and atraumatic.     Right Ear: Tympanic membrane, ear canal and external ear normal. There is no impacted cerumen.  Left Ear: Tympanic membrane, ear canal and external ear normal. There is no impacted cerumen.     Nose: Nose normal. No congestion or rhinorrhea.     Mouth/Throat:     Mouth: Mucous membranes are moist.     Pharynx: Oropharynx is clear. No oropharyngeal exudate or posterior oropharyngeal erythema.  Eyes:     General: No scleral icterus.       Right eye: No discharge.        Left eye: No discharge.     Extraocular Movements: Extraocular movements intact.     Conjunctiva/sclera: Conjunctivae normal.     Pupils:  Pupils are equal, round, and reactive to light.  Neck:     Vascular: No carotid bruit.  Cardiovascular:     Rate and Rhythm: Normal rate and regular rhythm.     Pulses: Normal pulses.     Heart sounds: No murmur heard.    No friction rub. No gallop.  Pulmonary:     Effort: Pulmonary effort is normal. No respiratory distress.     Breath sounds: Normal breath sounds. No stridor. No wheezing, rhonchi or rales.  Chest:     Chest wall: No tenderness.  Abdominal:     General: Abdomen is flat. Bowel sounds are normal. There is no distension.     Palpations: Abdomen is soft. There is no mass.     Tenderness: There is no abdominal tenderness. There is no right CVA tenderness, left CVA tenderness, guarding or rebound.     Hernia: No hernia is present.  Genitourinary:    Comments: Breast and pelvic exams deferred with shared decision making Musculoskeletal:        General: No swelling, tenderness, deformity or signs of injury.     Cervical back: Normal range of motion and neck supple. No rigidity. No muscular tenderness.     Right lower leg: No edema.     Left lower leg: No edema.  Lymphadenopathy:     Cervical: No cervical adenopathy.  Skin:    General: Skin is warm and dry.     Capillary Refill: Capillary refill takes less than 2 seconds.     Coloration: Skin is not jaundiced or pale.     Findings: No bruising, erythema, lesion or rash.  Neurological:     General: No focal deficit present.     Mental Status: She is alert and oriented to person, place, and time. Mental status is at baseline.     Cranial Nerves: No cranial nerve deficit.     Sensory: No sensory deficit.     Motor: No weakness.     Coordination: Coordination normal.     Gait: Gait normal.     Deep Tendon Reflexes: Reflexes normal.  Psychiatric:        Mood and Affect: Mood normal.        Behavior: Behavior normal.        Thought Content: Thought content normal.        Judgment: Judgment normal.     Results for  orders placed or performed in visit on 02/22/23  Pregnancy, urine  Result Value Ref Range   Preg Test, Ur Negative Negative      Assessment & Plan:   Problem List Items Addressed This Visit       Other   Anxiety    Not interested in any preventative medicines at this time. Willing to try hydroxyzine. Will hold onto klonopin for panic attacks. Refill given. Follow up 1 month.  Call with any concerns.       Relevant Medications   hydrOXYzine (ATARAX) 10 MG tablet   Routine general medical examination at a health care facility - Primary    Vaccines declined. Screening labs checked today. Pap up to date. Mammogram declined- will consider, so order placed. Continue diet and exercise. Call with any concerns.       Relevant Orders   CBC with Differential/Platelet   Comprehensive metabolic panel   Lipid Panel w/o Chol/HDL Ratio   Thyroid Panel With TSH   Other Visit Diagnoses     Perimenopausal       Labs drawn today. Await results. Not interested in HRT. Call with any concerns.   Relevant Orders   Pregnancy, urine (Completed)   Estradiol   FSH   LH   Testosterone, free, total([facility])   Progesterone   Iron Binding Cap (TIBC)([facility])   Encounter for screening mammogram for malignant neoplasm of breast       Relevant Orders   MM 3D SCREENING MAMMOGRAM BILATERAL BREAST        Follow up plan: Return in about 4 weeks (around 03/22/2023) for with me .   LABORATORY TESTING:  - Pap smear: up to date  IMMUNIZATIONS:   - Tdap: Tetanus vaccination status reviewed: refused. - Influenza: Refused - Pneumovax: Not applicable - Prevnar: Not applicable - COVID: Refused - HPV: Not applicable - Shingrix vaccine: Not applicable  SCREENING: -Mammogram: Refused   PATIENT COUNSELING:   Advised to take 1 mg of folate supplement per day if capable of pregnancy.   Sexuality: Discussed sexually transmitted diseases, partner selection, use of condoms, avoidance of  unintended pregnancy  and contraceptive alternatives.   Advised to avoid cigarette smoking.  I discussed with the patient that most people either abstain from alcohol or drink within safe limits (<=14/week and <=4 drinks/occasion for males, <=7/weeks and <= 3 drinks/occasion for females) and that the risk for alcohol disorders and other health effects rises proportionally with the number of drinks per week and how often a drinker exceeds daily limits.  Discussed cessation/primary prevention of drug use and availability of treatment for abuse.   Diet: Encouraged to adjust caloric intake to maintain  or achieve ideal body weight, to reduce intake of dietary saturated fat and total fat, to limit sodium intake by avoiding high sodium foods and not adding table salt, and to maintain adequate dietary potassium and calcium preferably from fresh fruits, vegetables, and low-fat dairy products.    stressed the importance of regular exercise  Injury prevention: Discussed safety belts, safety helmets, smoke detector, smoking near bedding or upholstery.   Dental health: Discussed importance of regular tooth brushing, flossing, and dental visits.    NEXT PREVENTATIVE PHYSICAL DUE IN 1 YEAR. Return in about 4 weeks (around 03/22/2023) for with me .  >25 minutes spent outside of physical with patient today

## 2023-02-22 NOTE — Assessment & Plan Note (Signed)
Not interested in any preventative medicines at this time. Willing to try hydroxyzine. Will hold onto klonopin for panic attacks. Refill given. Follow up 1 month. Call with any concerns.

## 2023-02-22 NOTE — Patient Instructions (Signed)
Please call to schedule your mammogram and/or bone density: [facility]  Address: [address], [place], [place] [address] Phone: [phone]  [facility] [place] [address] [place],  [place]  [address] Phone: [phone]   

## 2023-02-23 ENCOUNTER — Encounter: Payer: Self-pay | Admitting: Family Medicine

## 2023-03-01 ENCOUNTER — Telehealth: Payer: Self-pay

## 2023-03-01 NOTE — Telephone Encounter (Signed)
Spoke to patient - she is very happy with the results of her laser treatment on her toenails - she is still having athlete's foot between her toes - she wanted to know if the laser would clear that up - she is willing to pay for more treatments - Advised that we do laser on plantar warts, but that I didn't know if it would work/ if it was safe between the toes. Please call to advise thanks

## 2023-03-02 ENCOUNTER — Telehealth: Payer: Self-pay | Admitting: Nurse Practitioner

## 2023-03-02 NOTE — Telephone Encounter (Signed)
Copied from CRM [id number]. Topic: General - Other >> Mar 01, 2023  4:47 PM Turkey B wrote: Reason for CRM: Osborne Casco from [facility] [place], says pt has been receiving meds form [place], and says she faxed over a release form today,  to get more info about this

## 2023-03-02 NOTE — Telephone Encounter (Signed)
Returned call to [place] with [facility] [place]. Osborne Casco says they have a patient with similar DOB and name in the EHR and says their patient has been receiving medications or have medications listed in her profile. Osborne Casco says she is going to investigate and speak with her team, as they are located in [place] and it was flagged cause their patient by the name of Shannon Davidson has never seen our providers but has it listed in her profile. Osborne Casco says she was going to follow up with our office after speaking with her team.

## 2023-03-22 ENCOUNTER — Ambulatory Visit: Payer: 59 | Admitting: Physician Assistant

## 2023-03-29 ENCOUNTER — Other Ambulatory Visit: Payer: Self-pay

## 2023-03-29 ENCOUNTER — Ambulatory Visit: Payer: 59 | Admitting: Physician Assistant

## 2023-03-29 ENCOUNTER — Encounter: Payer: Self-pay | Admitting: Physician Assistant

## 2023-03-29 VITALS — BP 93/60 | HR 69 | Ht 65.0 in | Wt 164.8 lb

## 2023-03-29 DIAGNOSIS — F419 Anxiety disorder, unspecified: Secondary | ICD-10-CM

## 2023-03-29 MED ORDER — CLONAZEPAM 0.5 MG PO TABS
0.5000 mg | ORAL_TABLET | Freq: Every day | ORAL | 0 refills | Status: DC | PRN
  Filled 2023-03-29 – 2023-04-11 (×2): qty 30, 30d supply, fill #0

## 2023-03-29 NOTE — Progress Notes (Signed)
Acute Office Visit   Patient: Shannon Davidson   DOB: 1980-10-06   42 y.o. Female  MRN: [id number] Visit Date: 03/29/2023  Today's healthcare provider: Oswaldo Conroy March Joos, PA-C  Introduced myself to the patient as a Secondary school teacher and provided education on APPs in clinical practice.    Chief Complaint  Patient presents with   Depression    Patient says the medication is going well and everything is going good.    Subjective    HPI HPI     Depression    Additional comments: Patient says the medication is going well and everything is going good.       Last edited by Malen Gauze, CMA on 03/29/2023  1:22 PM.        Depression/Anxiety   She reports she has been taking Hydroxyzine daily and Klonopin for breakthrough She reports she has about 4 tablets of Klonopin left She is using Klonopin while at work due to quick onset and assistance with managing her symptoms   She does report some sleepiness with Hydroxyzine  She states she notices some increased anxiety leading up to her cycle  She is using Hydroxyzine as needed but is using daily   She is seeing therapy services and reports this is going well  She denies SI today She reports her sleep is much better but is still not the best She reports waking up around 2 AM and having difficulty falling back to sleep but this does not happen every night        03/29/2023    1:25 PM 02/22/2023    8:49 AM 02/01/2023    1:14 PM 07/23/2022   10:09 AM 01/28/2022   11:21 AM  Depression screen PHQ 2/9  Decreased Interest 0 1 0 1 0  Down, Depressed, Hopeless 0 2 1 1  0  PHQ - 2 Score 0 3 1 2  0  Altered sleeping 0 3 3 1  0  Tired, decreased energy 0 3 1 1 2   Change in appetite 0 0 0 0 0  Feeling bad or failure about yourself  0 1 2 1  0  Trouble concentrating 0 3 0 0 2  Moving slowly or fidgety/restless 0 0 0 0 0  Suicidal thoughts 0 0 0 0 0  PHQ-9 Score 0 13 7 5 4   Difficult doing work/chores Not difficult at all Very difficult  Somewhat difficult Somewhat difficult Somewhat difficult      03/29/2023    1:26 PM 02/22/2023    8:49 AM 02/01/2023    1:16 PM 07/23/2022   10:09 AM  GAD 7 : Generalized Anxiety Score  Nervous, Anxious, on Edge 1 3 3  0  Control/stop worrying 0 3 3 0  Worry too much - different things 0 3 3 0  Trouble relaxing 1 3 3  0  Restless 0 1 1 0  Easily annoyed or irritable 1 2 2  0  Afraid - awful might happen 0 1 1 0  Total GAD 7 Score 3 16 16  0  Anxiety Difficulty Not difficult at all Extremely difficult Somewhat difficult Not difficult at all       Medications: Outpatient Medications Prior to Visit  Medication Sig   hydrOXYzine (ATARAX) 10 MG tablet Take 1 tablet (10 mg total) by mouth 3 (three) times daily as needed.   [DISCONTINUED] clonazePAM (KLONOPIN) 0.5 MG tablet Take 1 tablet (0.5 mg total) by mouth daily as needed for anxiety.  No facility-administered medications prior to visit.    Review of Systems  Psychiatric/Behavioral:  Positive for dysphoric mood. The patient is nervous/anxious.         Objective    BP 93/60   Pulse 69   Ht 5\' 5"  (1.651 m)   Wt 164 lb 12.8 oz (74.8 kg)   SpO2 98%   BMI 27.42 kg/m     Physical Exam Vitals reviewed.  Constitutional:      General: She is awake.     Appearance: Normal appearance. She is well-developed and well-groomed.  HENT:     Head: Normocephalic and atraumatic.  Pulmonary:     Effort: Pulmonary effort is normal.  Neurological:     Mental Status: She is alert.  Psychiatric:        Attention and Perception: Attention and perception normal.        Mood and Affect: Mood and affect normal.        Speech: Speech normal.        Behavior: Behavior normal. Behavior is cooperative.       No results found for any visits on 03/29/23.  Assessment & Plan      Return in about 3 months (around 06/28/2023) for anxiety.      Problem List Items Addressed This Visit       Other   Anxiety - Primary    Unsure of  chronicity She reports improvement in severity since her last apt when she was started on hydroxyzine 10 mg PO TID PRN  PHQ9 and GAD7 results reviewed today with her - significant improvement since her last visit  She reports she is taking Hydroxyzine daily but not always taking TID  She is taking Klonopin 0.5 mg PO PRN for more severe anxiety - she reports taking most work days to assist with symptoms She declines maintenance medications at this time  She is seeing therapy services and reports finding this beneficial for her management Will provide refill of Klonopin today- reviewed that if this will be her preferred regimen she will likely need to have controlled substance contract and drug screening completed at her next apt per provider preference Follow up in 3 months or sooner if concerns arise        Relevant Medications   clonazePAM (KLONOPIN) 0.5 MG tablet     Return in about 3 months (around 06/28/2023) for anxiety.   I, Gaelle Adriance E Jermar Colter, PA-C, have reviewed all documentation for this visit. The documentation on 03/29/23 for the exam, diagnosis, procedures, and orders are all accurate and complete.   Jacquelin Hawking, MHS, PA-C [facility]

## 2023-03-29 NOTE — Assessment & Plan Note (Addendum)
Unsure of chronicity She reports improvement in severity since her last apt when she was started on hydroxyzine 10 mg PO TID PRN  PHQ9 and GAD7 results reviewed today with her - significant improvement since her last visit  She reports she is taking Hydroxyzine daily but not always taking TID  She is taking Klonopin 0.5 mg PO PRN for more severe anxiety - she reports taking most work days to assist with symptoms She declines maintenance medications at this time  She is seeing therapy services and reports finding this beneficial for her management Will provide refill of Klonopin today- reviewed that if this will be her preferred regimen she will likely need to have controlled substance contract and drug screening completed at her next apt per provider preference Follow up in 3 months or sooner if concerns arise

## 2023-04-05 DIAGNOSIS — H04123 Dry eye syndrome of bilateral lacrimal glands: Secondary | ICD-10-CM | POA: Diagnosis not present

## 2023-04-05 DIAGNOSIS — H5213 Myopia, bilateral: Secondary | ICD-10-CM | POA: Diagnosis not present

## 2023-04-06 ENCOUNTER — Other Ambulatory Visit: Payer: Self-pay

## 2023-04-11 ENCOUNTER — Other Ambulatory Visit: Payer: Self-pay

## 2023-06-28 ENCOUNTER — Ambulatory Visit: Payer: 59 | Admitting: Nurse Practitioner

## 2023-07-18 ENCOUNTER — Encounter: Payer: Self-pay | Admitting: Oncology

## 2023-07-18 ENCOUNTER — Other Ambulatory Visit: Payer: Self-pay

## 2023-08-30 ENCOUNTER — Encounter: Payer: Self-pay | Admitting: Oncology

## 2023-08-31 ENCOUNTER — Encounter: Payer: Self-pay | Admitting: Nurse Practitioner

## 2023-08-31 ENCOUNTER — Telehealth (INDEPENDENT_AMBULATORY_CARE_PROVIDER_SITE_OTHER): Payer: No Typology Code available for payment source | Admitting: Nurse Practitioner

## 2023-08-31 DIAGNOSIS — F419 Anxiety disorder, unspecified: Secondary | ICD-10-CM

## 2023-08-31 MED ORDER — CLONAZEPAM 0.5 MG PO TABS
0.5000 mg | ORAL_TABLET | Freq: Every day | ORAL | 0 refills | Status: AC | PRN
Start: 2023-08-31 — End: ?

## 2023-08-31 NOTE — Assessment & Plan Note (Signed)
Chronic.  Not well controlled.  Recent increase in panic attacks recently.  Working with [facility] in [place] for hormone therapy.  Needs a refill on Klonopin- refilled today.  Follow up in 3 months.  Call sooner if concerns arise.

## 2023-08-31 NOTE — Telephone Encounter (Signed)
Verified, we have received new insurance information.

## 2023-08-31 NOTE — Progress Notes (Unsigned)
Ht 5\' 5"  (1.651 m)   Wt 180 lb (81.6 kg)   BMI 29.95 kg/m    Subjective:    Patient ID: Shannon Davidson, female    DOB: 06-Jun-1981, 43 y.o.   MRN: [id number]  HPI: Shannon Davidson is a 43 y.o. female  Chief Complaint  Patient presents with   Follow-up    3 month f/u anxiety. FYI pt will upload new insurance today.   Depression/Anxiety   Patient states she started a new job and is now working from home.  This has been a huge improvement in her life.  She is being seen at [facility] in [place] for hormone replacement therapy.  She feels like this is what is driving her anxiety.  She has not been waking up with night sweats.  Around her cycle she is having a lot of panic attacks.  The last few weeks have been worse.  She does have 1 klonopin.  She is only using it as needed. She denies SI today.    Relevant past medical, surgical, family and social history reviewed and updated as indicated. Interim medical history since our last visit reviewed. Allergies and medications reviewed and updated.  Review of Systems  Psychiatric/Behavioral:  The patient is nervous/anxious.     Per HPI unless specifically indicated above     Objective:    Ht 5\' 5"  (1.651 m)   Wt 180 lb (81.6 kg)   BMI 29.95 kg/m   Wt Readings from Last 3 Encounters:  08/31/23 180 lb (81.6 kg)  03/29/23 164 lb 12.8 oz (74.8 kg)  02/22/23 164 lb 3.2 oz (74.5 kg)    Physical Exam Vitals and nursing note reviewed.  Constitutional:      General: She is not in acute distress.    Appearance: She is not ill-appearing.  HENT:     Head: Normocephalic.     Right Ear: Hearing normal.     Left Ear: Hearing normal.     Nose: Nose normal.  Pulmonary:     Effort: Pulmonary effort is normal. No respiratory distress.  Neurological:     Mental Status: She is alert.  Psychiatric:        Mood and Affect: Mood normal.        Behavior: Behavior normal.        Thought Content: Thought content normal.        Judgment:  Judgment normal.     Results for orders placed or performed in visit on 02/22/23  Pregnancy, urine   Collection Time: 02/22/23  9:20 AM  Result Value Ref Range   Preg Test, Ur Negative Negative  CBC with Differential/Platelet   Collection Time: 02/22/23  9:23 AM  Result Value Ref Range   WBC 5.5 3.4 - 10.8 x10E3/uL   RBC 4.87 3.77 - 5.28 x10E6/uL   Hemoglobin 15.1 11.1 - 15.9 g/dL   Hematocrit 40.9 81.1 - 46.6 %   MCV 92 79 - 97 fL   MCH 31.0 26.6 - 33.0 pg   MCHC 33.9 31.5 - 35.7 g/dL   RDW 91.4 78.2 - 95.6 %   Platelets 333 150 - 450 x10E3/uL   Neutrophils 52 Not Estab. %   Lymphs 37 Not Estab. %   Monocytes 8 Not Estab. %   Eos 2 Not Estab. %   Basos 1 Not Estab. %   Neutrophils Absolute 2.8 1.4 - 7.0 x10E3/uL   Lymphocytes Absolute 2.0 0.7 - 3.1 x10E3/uL   Monocytes  Absolute 0.5 0.1 - 0.9 x10E3/uL   EOS (ABSOLUTE) 0.1 0.0 - 0.4 x10E3/uL   Basophils Absolute 0.1 0.0 - 0.2 x10E3/uL   Immature Granulocytes 0 Not Estab. %   Immature Grans (Abs) 0.0 0.0 - 0.1 x10E3/uL  Comprehensive metabolic panel   Collection Time: 02/22/23  9:23 AM  Result Value Ref Range   Glucose 96 70 - 99 mg/dL   BUN 15 6 - 24 mg/dL   Creatinine, Ser 0.98 0.57 - 1.00 mg/dL   eGFR 93 >11 BJ/YNW/2.95   BUN/Creatinine Ratio 18 9 - 23   Sodium 141 134 - 144 mmol/L   Potassium 4.5 3.5 - 5.2 mmol/L   Chloride 102 96 - 106 mmol/L   CO2 22 20 - 29 mmol/L   Calcium 9.5 8.7 - 10.2 mg/dL   Total Protein 7.4 6.0 - 8.5 g/dL   Albumin 4.9 3.9 - 4.9 g/dL   Globulin, Total 2.5 1.5 - 4.5 g/dL   Bilirubin Total 0.6 0.0 - 1.2 mg/dL   Alkaline Phosphatase 42 (L) 44 - 121 IU/L   AST 16 0 - 40 IU/L   ALT 14 0 - 32 IU/L  Lipid Panel w/o Chol/HDL Ratio   Collection Time: 02/22/23  9:23 AM  Result Value Ref Range   Cholesterol, Total 256 (H) 100 - 199 mg/dL   Triglycerides 89 0 - 149 mg/dL   HDL 41 >62 mg/dL   VLDL Cholesterol Cal 15 5 - 40 mg/dL   LDL Chol Calc (NIH) 130 (H) 0 - 99 mg/dL   LDL CALC COMMENT:  Comment   Estradiol   Collection Time: 02/22/23  9:23 AM  Result Value Ref Range   Estradiol 40.7 pg/mL  [facility]   Collection Time: 02/22/23  9:23 AM  Result Value Ref Range   FSH 3.8 mIU/mL  LH   Collection Time: 02/22/23  9:23 AM  Result Value Ref Range   LH 5.2 mIU/mL  Testosterone, free, total([facility])   Collection Time: 02/22/23  9:23 AM  Result Value Ref Range   Testosterone 21 8 - 60 ng/dL   Testosterone, Free 1.3 0.0 - 4.2 pg/mL   Sex Hormone Binding 54.6 24.6 - 122.0 nmol/L  Progesterone   Collection Time: 02/22/23  9:23 AM  Result Value Ref Range   Progesterone 1.0 ng/mL  Thyroid Panel With TSH   Collection Time: 02/22/23  9:23 AM  Result Value Ref Range   TSH 1.170 0.450 - 4.500 uIU/mL   T4, Total 8.4 4.5 - 12.0 ug/dL   T3 Uptake Ratio 31 24 - 39 %   Free Thyroxine Index 2.6 1.2 - 4.9  Iron Binding Cap (TIBC)([facility])   Collection Time: 02/22/23  9:23 AM  Result Value Ref Range   Total Iron Binding Capacity 344 250 - 450 ug/dL   UIBC 865 784 - 696 ug/dL   Iron 89 27 - 295 ug/dL   Iron Saturation 26 15 - 55 %      Assessment & Plan:   Problem List Items Addressed This Visit       Other   Anxiety   Chronic.  Not well controlled.  Recent increase in panic attacks recently.  Working with [facility] in [place] for hormone therapy.  Needs a refill on Klonopin- refilled today.  Follow up in 3 months.  Call sooner if concerns arise.       Relevant Medications   clonazePAM (KLONOPIN) 0.5 MG tablet     Follow up plan: No follow-ups on file.  This visit was completed via MyChart due to the restrictions of the COVID-19 pandemic. All issues as above were discussed and addressed. Physical exam was done as above through visual confirmation on MyChart. If it was felt that the patient should be evaluated in the office, they were directed there. The patient verbally consented to this visit. Location of the patient: Home Location of the provider:  Office Those involved with this call:  Provider: Larae Grooms, NP CMA: Irene Pap, CMA Front Desk/Registration: Servando Snare This encounter was conducted via video.  I spent 20 dedicated to the care of this patient on the date of this encounter to include previsit review of symptoms, plan of care and follow up, face to face time with the patient, and post visit ordering of testing.

## 2023-12-26 ENCOUNTER — Encounter: Payer: Self-pay | Admitting: Nurse Practitioner

## 2023-12-26 DIAGNOSIS — F419 Anxiety disorder, unspecified: Secondary | ICD-10-CM

## 2023-12-27 MED ORDER — CLONAZEPAM 0.5 MG PO TABS
0.5000 mg | ORAL_TABLET | Freq: Every day | ORAL | 0 refills | Status: DC | PRN
Start: 2023-12-27 — End: 2024-03-06

## 2024-01-17 ENCOUNTER — Ambulatory Visit
Admission: EM | Admit: 2024-01-17 | Discharge: 2024-01-17 | Disposition: A | Discharge status: Home / Self Care | Attending: Emergency Medicine | Admitting: Emergency Medicine

## 2024-01-17 DIAGNOSIS — R030 Elevated blood-pressure reading, without diagnosis of hypertension: Secondary | ICD-10-CM | POA: Diagnosis not present

## 2024-01-17 DIAGNOSIS — B977 Papillomavirus as the cause of diseases classified elsewhere: Secondary | ICD-10-CM | POA: Insufficient documentation

## 2024-01-17 NOTE — ED Provider Notes (Signed)
 [facility]    CSN: [id number] Arrival date & time: 01/17/24  0815      History   Chief Complaint Chief Complaint  Patient presents with   Hypertension    HPI Shannon Davidson is a 43 y.o. female.   HPI  43 year old female with past medical history significant for hyperlipidemia, palpitations, PCOS, Lyme disease, Bartonella, and Babesia with elevated EBV antibody presents for evaluation of elevated blood pressure.  She reports that she took her blood pressure yesterday and the highest reading she obtained was 200/120.  At that time she took a friend's metoprolol and nitroglycerin around 930 last night.  She also endorses headache and nausea.  Review of epic records does not show any history of hypertension when reviewing her historic trends.  Past Medical History:  Diagnosis Date   Babesiasis    Bartonella infection    Elevated EBV antibody titer    History of kidney stones    HLD (hyperlipidemia)    Kidney stone    Dr. Twylla   Lyme disease    Palpitations    Pap smear for cervical cancer screening 2012   Dr. Barbette   PCOS (polycystic ovarian syndrome)    Relapsing remitting multiple sclerosis (HCC) 11/25/2014   Patient says that all her MS symptoms went away in 2017-2018 after she was treated for tick-bourne illnesses    Patient Active Problem List   Diagnosis Date Noted   HPV in female 01/17/2024   Dizziness 01/28/2022   Obesity (BMI 30.0-34.9) 06/10/2021   Acute appendicitis 09/18/2019   Excessive daytime sleepiness 02/20/2019   Chronic fatigue 06/18/2015   HSV infection 01/20/2015   Chronic maxillary sinusitis 01/20/2015   MS (multiple sclerosis) (HCC) 11/25/2014   Optic neuritis, right 11/01/2014   Routine general medical examination at a health care facility 06/17/2014   Low back pain 06/17/2014   Nephrolithiasis 04/25/2014   Elevated LFTs 02/20/2014   Arthralgia 02/20/2014   Anxiety 05/25/2013   Mixed emotional features as adjustment reaction  05/25/2013   Other general symptoms and signs 05/25/2013   PCOS (polycystic ovarian syndrome) 09/09/2011   Overweight 06/08/2011   Familial multiple lipoprotein-type hyperlipidemia 08/19/2009   Headache 08/19/2009   PALPITATIONS 08/19/2009   MURMUR 08/19/2009    Past Surgical History:  Procedure Laterality Date   APPENDECTOMY  09/2019   CESAREAN SECTION  2008   CYSTOSCOPY/URETEROSCOPY/HOLMIUM LASER/STENT PLACEMENT Left 12/02/2020   Procedure: CYSTOSCOPY/URETEROSCOPY/HOLMIUM LASER/STENT EXCHANGE;  Surgeon: Twylla Glendia BROCKS, MD;  Location: [facility];  Service: Urology;  Laterality: Left;   LITHOTRIPSY     TONSILLECTOMY N/A 05/20/2016   Procedure: TONSILLECTOMY;  Surgeon: Deward Argue, MD;  Location: [facility];  Service: ENT;  Laterality: N/A;   TONSILLECTOMY      OB History   No obstetric history on file.      Home Medications    Prior to Admission medications   Medication Sig Start Date End Date Taking? Authorizing Provider  clonazePAM  (KLONOPIN ) 0.5 MG tablet Take 1 tablet (0.5 mg total) by mouth daily as needed for anxiety. 12/27/23   Melvin Pao, NP  hydrOXYzine  (ATARAX ) 10 MG tablet Take 1 tablet (10 mg total) by mouth 3 (three) times daily as needed. 02/22/23   Vicci Duwaine SQUIBB, DO    Family History Family History  Problem Relation Age of Onset   Heart disease Maternal Grandmother 28       s/p CABG   Dementia Maternal Grandmother    Hypertension Mother  Seizures Father    Hyperlipidemia Father    Achalasia Sister    COPD Maternal Grandfather    Diabetes Paternal Grandmother     Social History Social History   Tobacco Use   Smoking status: Never   Smokeless tobacco: Never  Vaping Use   Vaping status: Never Used  Substance Use Topics   Alcohol use: Yes    Comment: social - maybe 3 glasses of wine/month    Drug use: No     Allergies   Egg white (egg protein) and Influenza vaccines   Review of Systems Review of Systems  Eyes:   Negative for visual disturbance.  Respiratory:  Negative for shortness of breath.   Cardiovascular:  Negative for chest pain.  Gastrointestinal:  Positive for nausea.  Neurological:  Positive for headaches.     Physical Exam Triage Vital Signs ED Triage Vitals  Encounter Vitals Group     BP      Girls Systolic BP Percentile      Girls Diastolic BP Percentile      Boys Systolic BP Percentile      Boys Diastolic BP Percentile      Pulse      Resp      Temp      Temp src      SpO2      Weight      Height      Head Circumference      Peak Flow      Pain Score      Pain Loc      Pain Education      Exclude from Growth Chart    No data found.  Updated Vital Signs BP 138/84 (BP Location: Left Arm)   Pulse 70   Temp 98.8 F (37.1 C) (Oral)   Resp 16   Ht 5' 5 (1.651 m)   Wt 185 lb (83.9 kg)   LMP 01/03/2024 (Approximate)   SpO2 97%   BMI 30.79 kg/m   Visual Acuity Right Eye Distance:   Left Eye Distance:   Bilateral Distance:    Right Eye Near:   Left Eye Near:    Bilateral Near:     Physical Exam Vitals and nursing note reviewed.  Constitutional:      Appearance: Normal appearance. She is not ill-appearing.  HENT:     Head: Normocephalic and atraumatic.  Cardiovascular:     Rate and Rhythm: Normal rate and regular rhythm.     Pulses: Normal pulses.     Heart sounds: Normal heart sounds. No murmur heard.    No friction rub. No gallop.  Pulmonary:     Effort: Pulmonary effort is normal.     Breath sounds: Normal breath sounds. No wheezing, rhonchi or rales.  Skin:    General: Skin is warm and dry.     Capillary Refill: Capillary refill takes less than 2 seconds.     Findings: No rash.  Neurological:     General: No focal deficit present.     Mental Status: She is alert and oriented to person, place, and time.      UC Treatments / Results  Labs (all labs ordered are listed, but only abnormal results are displayed) Labs Reviewed - No data to  display  EKG Normal sinus rhythm with a ventricular rate of 70 bpm PR interval 152 ms QRS duration 88 ms QT/QTc 434/468 ms No ST or T wave abnormalities noted.  Radiology No results found.  Procedures Procedures (including critical care time)  Medications Ordered in UC Medications - No data to display  Initial Impression / Assessment and Plan / UC Course  I have reviewed the triage vital signs and the nursing notes.  Pertinent labs & imaging results that were available during my care of the patient were reviewed by me and considered in my medical decision making (see chart for details).   Patient is a very pleasant, nontoxic-appearing 43 year old female presenting for evaluation due to elevated blood pressure with associated headache and nausea as outlined in HPI above.  She reports that she just did not feel right all weekend.  She cannot explain exactly what that meant but denies chest pain or shortness of breath.  Last night she had a headache and nausea so she checked her blood pressure and found it to be elevated with the highest reading Being 200/120.  She took a friend's sublingual nitro at around 9 PM and then at 930 she took a 25 mg metoprolol.  When she woke up this morning her blood pressure continued to be elevated but it has since come down.  She is using a manual cuff at home and not an automated cuff.  She reports that all week and she had poor sleep quality as well.  She typically does cardio vascular exercise 3-4 times a week and strength training 1-2 times a week.  She also typically has a very strict dietary regimen but reports that she has been eating poorly as of late.  She has not taken any new supplements or cold medication.  Currently in the exam room she is not in any acute distress and her blood pressure is soft at 138/84.  While it is mildly hypertensive it is not in the range of pharmacotherapy.  I would first attempt lifestyle modifications.  Your EKG shows normal  sinus rhythm without any ST or T wave abnormalities.  No appreciable change when compared to EKG from 08/19/2009.  Cardiopulmonary samaras S1-S2 heart sounds with regular rate and rhythm lung sounds are clear to auscultation all fields.  Discharge the patient home with a diagnosis of elevated blood pressure without diagnosis of hypertension and I have encouraged her to purchase an automated cuff for a more accurate blood pressure reading.  We also discussed that she needs to be sitting in a relaxed state, with her bladder empty, her feet flat on the floor, and her arm that she is taking the reading and at heart level for 5 minutes before she takes the reading.  She should record her values and discuss them with her PCP.  I encouraged her to make an appointment to touch base with her PCP about the elevated blood pressure.  I have also advised her to take her blood pressure at different points in the day to see if there is variability throughout the day.  ER precautions reviewed.  Final Clinical Impressions(s) / UC Diagnoses   Final diagnoses:  Elevated blood pressure reading without diagnosis of hypertension     Discharge Instructions      As we discussed, your EKG is very reassuring, as is your physical exam and vital signs.  Purchase an automated blood pressure cuff as this will be more accurate than doing a manual blood pressure on your self.  Continue your exercise regimen and get back to your more disciplined dietary regimen that you described having.  Take your blood pressure periodically throughout the week and at different times the day to assess  for variability.  Make sure when you are taking her blood pressure that you have been in a relaxed state for at least 30 minutes, your bladder is empty, your feet are flat on the floor, in the arm you are taking the reading and is at heart level for at least 5 minutes.  Take the reading and record it.  Is if you have a return of headache, with or  without change in vision, nausea, chest pain, shortness of breath, sweating, pain radiating to your left shoulder or left jaw, indigestion, or pain radiating to the back between your shoulder blades you need to go to the ER for evaluation.     ED Prescriptions   None    PDMP not reviewed this encounter.   Bernardino Ditch, NP 01/17/24 0900

## 2024-01-17 NOTE — Discharge Instructions (Addendum)
 As we discussed, your EKG is very reassuring, as is your physical exam and vital signs.  Purchase an automated blood pressure cuff as this will be more accurate than doing a manual blood pressure on your self.  Continue your exercise regimen and get back to your more disciplined dietary regimen that you described having.  Take your blood pressure periodically throughout the week and at different times the day to assess for variability.  Make sure when you are taking her blood pressure that you have been in a relaxed state for at least 30 minutes, your bladder is empty, your feet are flat on the floor, in the arm you are taking the reading and is at heart level for at least 5 minutes.  Take the reading and record it.  Is if you have a return of headache, with or without change in vision, nausea, chest pain, shortness of breath, sweating, pain radiating to your left shoulder or left jaw, indigestion, or pain radiating to the back between your shoulder blades you need to go to the ER for evaluation.

## 2024-01-17 NOTE — ED Triage Notes (Signed)
 Pt c/o elevated BP,HA & nausea  x1 day. Highest being 200/120 yesterday. States she took friends metoprolol & nitroglycerin last dose around 2130.

## 2024-02-16 ENCOUNTER — Telehealth: Payer: Self-pay

## 2024-02-16 DIAGNOSIS — H469 Unspecified optic neuritis: Secondary | ICD-10-CM

## 2024-02-16 NOTE — Telephone Encounter (Signed)
 Copied from CRM [id number]. Topic: Referral - Question >> Feb 16, 2024  1:27 PM Sophia H wrote: Reason for CRM: Patient wants to let the provider know that she has optic neuritis that started again about a week ago, has had this before 8 years ago and wants to know if PCP can refer her to a neuro opthamologist. Has an annual scheduled at the end of the month and wants to know if this can be done before then and she will follow up as scheduled since this is a chronic condition that she has. Please advise. # [phone] Patient states if no answer please leave a detailed message letting her know if this can be done or not. Ty

## 2024-02-17 NOTE — Telephone Encounter (Signed)
 Referral placed.

## 2024-02-17 NOTE — Telephone Encounter (Signed)
 Called and notified patient that referral has been placed for her.

## 2024-02-28 ENCOUNTER — Encounter: Admitting: Nurse Practitioner

## 2024-03-06 ENCOUNTER — Encounter: Payer: Self-pay | Admitting: Nurse Practitioner

## 2024-03-06 ENCOUNTER — Ambulatory Visit (INDEPENDENT_AMBULATORY_CARE_PROVIDER_SITE_OTHER): Admitting: Nurse Practitioner

## 2024-03-06 VITALS — BP 121/83 | HR 69 | Temp 98.6°F | Resp 15 | Ht 65.0 in | Wt 188.4 lb

## 2024-03-06 DIAGNOSIS — Z0000 Encounter for general adult medical examination without abnormal findings: Secondary | ICD-10-CM | POA: Diagnosis not present

## 2024-03-06 DIAGNOSIS — E66811 Obesity, class 1: Secondary | ICD-10-CM

## 2024-03-06 DIAGNOSIS — F419 Anxiety disorder, unspecified: Secondary | ICD-10-CM | POA: Diagnosis not present

## 2024-03-06 DIAGNOSIS — E782 Mixed hyperlipidemia: Secondary | ICD-10-CM

## 2024-03-06 DIAGNOSIS — G35 Multiple sclerosis: Secondary | ICD-10-CM

## 2024-03-06 DIAGNOSIS — R5383 Other fatigue: Secondary | ICD-10-CM

## 2024-03-06 MED ORDER — CLONAZEPAM 0.5 MG PO TABS: 0.5000 mg | ORAL_TABLET | Freq: Every day | ORAL | 0 refills | Status: DC | PRN

## 2024-03-06 NOTE — Assessment & Plan Note (Signed)
 Chronic.  Controlled.  Continue with current medication regimen.  Labs ordered today.  Return to clinic in 6 months for reevaluation.  Call sooner if concerns arise.  ? ?

## 2024-03-06 NOTE — Assessment & Plan Note (Signed)
Chronic.  Controlled with out medication at this time.Labs ordered today.  Return to clinic in 6 months for reevaluation.  Call sooner if concerns arise.

## 2024-03-06 NOTE — Patient Instructions (Signed)
 Referral sent to [facility]  Phone: [phone] Fax: [phone]

## 2024-03-06 NOTE — Assessment & Plan Note (Signed)
 Recommended eating smaller high protein, low fat meals more frequently and exercising 30 mins a day 5 times a week with a goal of 10-15lb weight loss in the next 3 months.

## 2024-03-06 NOTE — Assessment & Plan Note (Signed)
 Chronic.  Controlled with PRN hydroxyzine  and Klonopin .  Working with [facility] in [place] for hormone therapy.  Needs a refill on Klonopin - refilled today.  Follow up in 3 months.  Call sooner if concerns arise.

## 2024-03-06 NOTE — Progress Notes (Signed)
 BP 121/83 (BP Location: Left Arm, Patient Position: Sitting, Cuff Size: Large)   Pulse 69   Temp 98.6 F (37 C) (Oral)   Resp 15   Ht 5' 5 (1.651 m)   Wt 188 lb 6.4 oz (85.5 kg)   LMP 02/20/2024 (Approximate)   SpO2 96%   BMI 31.35 kg/m    Subjective:    Patient ID: Shannon Davidson Sharps, female    DOB: 1981/04/14, 43 y.o.   MRN: [id number]  HPI: Shannon Davidson is a 43 y.o. female presenting on 03/06/2024 for comprehensive medical examination. Current medical complaints include:Optic Neuritis  She currently lives with: Menopausal Symptoms: no  Patient was diagnosed with Optic Neuritis when she was diagnosed with MS.  She was having eye pain.  Symptoms have improved some with home anti inflammatory information. She would like have a few labs drawn.    ANXIETY Patient still struggles with anxiety.  Uses hydroxyzine  first line and Klonopin  if needed when she is really struggling.  Does need a refill today.    Depression Screen done today and results listed below:     03/06/2024    8:03 AM 08/31/2023    8:21 AM 03/29/2023    1:25 PM 02/22/2023    8:49 AM 02/01/2023    1:14 PM  Depression screen PHQ 2/9  Decreased Interest 1 1 0 1 0  Down, Depressed, Hopeless 0 0 0 2 1  PHQ - 2 Score 1 1 0 3 1  Altered sleeping 2 3 0 3 3  Tired, decreased energy 3 3 0 3 1  Change in appetite 0 0 0 0 0  Feeling bad or failure about yourself  0 1 0 1 2  Trouble concentrating 0 0 0 3 0  Moving slowly or fidgety/restless 0 0 0 0 0  Suicidal thoughts 0 0 0 0 0  PHQ-9 Score 6 8 0 13 7  Difficult doing work/chores Somewhat difficult Not difficult at all Not difficult at all Very difficult Somewhat difficult    The patient does not have a history of falls. I did complete a risk assessment for falls. A plan of care for falls was documented.   Past Medical History:  Past Medical History:  Diagnosis Date   Babesiasis    Bartonella infection    Elevated EBV antibody titer    History of kidney stones     HLD (hyperlipidemia)    Kidney stone    Dr. Twylla   Lyme disease    Palpitations    Pap smear for cervical cancer screening 2012   Dr. Barbette   PCOS (polycystic ovarian syndrome)    Relapsing remitting multiple sclerosis (HCC) 11/25/2014   Patient says that all her MS symptoms went away in 2017-2018 after she was treated for tick-bourne illnesses    Surgical History:  Past Surgical History:  Procedure Laterality Date   APPENDECTOMY  09/2019   CESAREAN SECTION  2008   CYSTOSCOPY/URETEROSCOPY/HOLMIUM LASER/STENT PLACEMENT Left 12/02/2020   Procedure: CYSTOSCOPY/URETEROSCOPY/HOLMIUM LASER/STENT EXCHANGE;  Surgeon: Twylla Glendia BROCKS, MD;  Location: [facility];  Service: Urology;  Laterality: Left;   LITHOTRIPSY     TONSILLECTOMY N/A 05/20/2016   Procedure: TONSILLECTOMY;  Surgeon: Deward Argue, MD;  Location: [facility];  Service: ENT;  Laterality: N/A;   TONSILLECTOMY      Medications:  Current Outpatient Medications on File Prior to Visit  Medication Sig   hydrOXYzine  (ATARAX ) 10 MG tablet Take 1 tablet (10 mg total) by  mouth 3 (three) times daily as needed.   No current facility-administered medications on file prior to visit.    Allergies:  Allergies  Allergen Reactions   Egg White (Egg Protein) Swelling   Influenza Vaccines Swelling    Social History:  Social History   Socioeconomic History   Marital status: Single    Spouse name: Not on file   Number of children: 1   Years of education: Not on file   Highest education level: Not on file  Occupational History   Occupation: Journalist, newspaper GI    Employer: [employer]  Tobacco Use   Smoking status: Never   Smokeless tobacco: Never  Vaping Use   Vaping status: Never Used  Substance and Sexual Activity   Alcohol use: Yes    Comment: social - maybe 3 glasses of wine/month    Drug use: No   Sexual activity: Not Currently    Birth control/protection: None, Condom  Other Topics Concern   Not on file  Social  History Narrative   Has one child.      Single, full time RN.    Social Drivers of Corporate investment banker Strain: Low Risk  (03/06/2024)   Overall Financial Resource Strain (CARDIA)    Difficulty of Paying Living Expenses: Not hard at all  Food Insecurity: No Food Insecurity (03/06/2024)   Hunger Vital Sign    Worried About Running Out of Food in the Last Year: Never true    Ran Out of Food in the Last Year: Never true  Transportation Needs: No Transportation Needs (09/18/2019)   Received from [facility]   PRAPARE - Transportation    Lack of Transportation (Medical): No    Lack of Transportation (Non-Medical): No  Physical Activity: Inactive (03/06/2024)   Exercise Vital Sign    Days of Exercise per Week: 0 days    Minutes of Exercise per Session: 0 min  Stress: No Stress Concern Present (03/06/2024)   [employer] of Occupational Health - Occupational Stress Questionnaire    Feeling of Stress: Not at all  Social Connections: Socially Isolated (03/06/2024)   Social Connection and Isolation Panel    Frequency of Communication with Friends and Family: Once a week    Frequency of Social Gatherings with Friends and Family: Once a week    Attends Religious Services: More than 4 times per year    Active Member of [employer] or Organizations: No    Attends Banker Meetings: Never    Marital Status: Never married  Intimate Partner Violence: Not At Risk (09/18/2019)   Received from [facility]   Humiliation, Afraid, Rape, and Kick questionnaire    Within the last year, have you been afraid of your partner or ex-partner?: No    Within the last year, have you been humiliated or emotionally abused in other ways by your partner or ex-partner?: No    Within the last year, have you been kicked, hit, slapped, or otherwise physically hurt by your partner or ex-partner?: No    Within the last year, have you been raped or forced to have any kind of sexual activity by your  partner or ex-partner?: No   Social History   Tobacco Use  Smoking Status Never  Smokeless Tobacco Never   Social History   Substance and Sexual Activity  Alcohol Use Yes   Comment: social - maybe 3 glasses of wine/month     Family History:  Family History  Problem Relation  Age of Onset   Heart disease Maternal Grandmother 23       s/p CABG   Dementia Maternal Grandmother    Hypertension Mother    Seizures Father    Hyperlipidemia Father    Achalasia Sister    COPD Maternal Grandfather    Diabetes Paternal Grandmother     Past medical history, surgical history, medications, allergies, family history and social history reviewed with patient today and changes made to appropriate areas of the chart.   Review of Systems  Eyes:  Positive for pain.  Psychiatric/Behavioral:  The patient is nervous/anxious.    All other ROS negative except what is listed above and in the HPI.      Objective:    BP 121/83 (BP Location: Left Arm, Patient Position: Sitting, Cuff Size: Large)   Pulse 69   Temp 98.6 F (37 C) (Oral)   Resp 15   Ht 5' 5 (1.651 m)   Wt 188 lb 6.4 oz (85.5 kg)   LMP 02/20/2024 (Approximate)   SpO2 96%   BMI 31.35 kg/m   Wt Readings from Last 3 Encounters:  03/06/24 188 lb 6.4 oz (85.5 kg)  01/17/24 185 lb (83.9 kg)  08/31/23 180 lb (81.6 kg)    Physical Exam Vitals and nursing note reviewed.  Constitutional:      General: She is awake. She is not in acute distress.    Appearance: Normal appearance. She is well-developed. She is not ill-appearing.  HENT:     Head: Normocephalic and atraumatic.     Right Ear: Hearing, tympanic membrane, ear canal and external ear normal. No drainage.     Left Ear: Hearing, tympanic membrane, ear canal and external ear normal. No drainage.     Nose: Nose normal.     Right Sinus: No maxillary sinus tenderness or frontal sinus tenderness.     Left Sinus: No maxillary sinus tenderness or frontal sinus tenderness.      Mouth/Throat:     Mouth: Mucous membranes are moist.     Pharynx: Oropharynx is clear. Uvula midline. No pharyngeal swelling, oropharyngeal exudate or posterior oropharyngeal erythema.  Eyes:     General: Lids are normal.        Right eye: No discharge.        Left eye: No discharge.     Extraocular Movements: Extraocular movements intact.     Conjunctiva/sclera: Conjunctivae normal.     Pupils: Pupils are equal, round, and reactive to light.     Visual Fields: Right eye visual fields normal and left eye visual fields normal.  Neck:     Thyroid : No thyromegaly.     Vascular: No carotid bruit.     Trachea: Trachea normal.  Cardiovascular:     Rate and Rhythm: Normal rate and regular rhythm.     Heart sounds: Normal heart sounds. No murmur heard.    No gallop.  Pulmonary:     Effort: Pulmonary effort is normal. No accessory muscle usage or respiratory distress.     Breath sounds: Normal breath sounds.  Chest:  Breasts:    Right: Normal.     Left: Normal.  Abdominal:     General: Bowel sounds are normal.     Palpations: Abdomen is soft. There is no hepatomegaly or splenomegaly.     Tenderness: There is no abdominal tenderness.  Musculoskeletal:        General: Normal range of motion.     Cervical back: Normal range of motion  and neck supple.     Right lower leg: No edema.     Left lower leg: No edema.  Lymphadenopathy:     Head:     Right side of head: No submental, submandibular, tonsillar, preauricular or posterior auricular adenopathy.     Left side of head: No submental, submandibular, tonsillar, preauricular or posterior auricular adenopathy.     Cervical: No cervical adenopathy.     Upper Body:     Right upper body: No supraclavicular, axillary or pectoral adenopathy.     Left upper body: No supraclavicular, axillary or pectoral adenopathy.  Skin:    General: Skin is warm and dry.     Capillary Refill: Capillary refill takes less than 2 seconds.     Findings: No rash.   Neurological:     Mental Status: She is alert and oriented to person, place, and time.     Gait: Gait is intact.  Psychiatric:        Attention and Perception: Attention normal.        Mood and Affect: Mood normal.        Speech: Speech normal.        Behavior: Behavior normal. Behavior is cooperative.        Thought Content: Thought content normal.        Judgment: Judgment normal.     Results for orders placed or performed in visit on 02/22/23  Pregnancy, urine   Collection Time: 02/22/23  9:20 AM  Result Value Ref Range   Preg Test, Ur Negative Negative  CBC with Differential/Platelet   Collection Time: 02/22/23  9:23 AM  Result Value Ref Range   WBC 5.5 3.4 - 10.8 x10E3/uL   RBC 4.87 3.77 - 5.28 x10E6/uL   Hemoglobin 15.1 11.1 - 15.9 g/dL   Hematocrit 55.3 65.9 - 46.6 %   MCV 92 79 - 97 fL   MCH 31.0 26.6 - 33.0 pg   MCHC 33.9 31.5 - 35.7 g/dL   RDW 86.8 88.2 - 84.5 %   Platelets 333 150 - 450 x10E3/uL   Neutrophils 52 Not Estab. %   Lymphs 37 Not Estab. %   Monocytes 8 Not Estab. %   Eos 2 Not Estab. %   Basos 1 Not Estab. %   Neutrophils Absolute 2.8 1.4 - 7.0 x10E3/uL   Lymphocytes Absolute 2.0 0.7 - 3.1 x10E3/uL   Monocytes Absolute 0.5 0.1 - 0.9 x10E3/uL   EOS (ABSOLUTE) 0.1 0.0 - 0.4 x10E3/uL   Basophils Absolute 0.1 0.0 - 0.2 x10E3/uL   Immature Granulocytes 0 Not Estab. %   Immature Grans (Abs) 0.0 0.0 - 0.1 x10E3/uL  Comprehensive metabolic panel   Collection Time: 02/22/23  9:23 AM  Result Value Ref Range   Glucose 96 70 - 99 mg/dL   BUN 15 6 - 24 mg/dL   Creatinine, Ser 9.17 0.57 - 1.00 mg/dL   eGFR 93 >40 fO/fpw/8.26   BUN/Creatinine Ratio 18 9 - 23   Sodium 141 134 - 144 mmol/L   Potassium 4.5 3.5 - 5.2 mmol/L   Chloride 102 96 - 106 mmol/L   CO2 22 20 - 29 mmol/L   Calcium 9.5 8.7 - 10.2 mg/dL   Total Protein 7.4 6.0 - 8.5 g/dL   Albumin 4.9 3.9 - 4.9 g/dL   Globulin, Total 2.5 1.5 - 4.5 g/dL   Bilirubin Total 0.6 0.0 - 1.2 mg/dL    Alkaline Phosphatase 42 (L) 44 - 121 IU/L   AST  16 0 - 40 IU/L   ALT 14 0 - 32 IU/L  Lipid Panel w/o Chol/HDL Ratio   Collection Time: 02/22/23  9:23 AM  Result Value Ref Range   Cholesterol, Total 256 (H) 100 - 199 mg/dL   Triglycerides 89 0 - 149 mg/dL   HDL 41 >60 mg/dL   VLDL Cholesterol Cal 15 5 - 40 mg/dL   LDL Chol Calc (NIH) 799 (H) 0 - 99 mg/dL   LDL CALC COMMENT: Comment   Estradiol    Collection Time: 02/22/23  9:23 AM  Result Value Ref Range   Estradiol  40.7 pg/mL  [facility]   Collection Time: 02/22/23  9:23 AM  Result Value Ref Range   FSH 3.8 mIU/mL  LH   Collection Time: 02/22/23  9:23 AM  Result Value Ref Range   LH 5.2 mIU/mL  Testosterone , free, total([facility])   Collection Time: 02/22/23  9:23 AM  Result Value Ref Range   Testosterone  21 8 - 60 ng/dL   Testosterone , Free 1.3 0.0 - 4.2 pg/mL   Sex Hormone Binding 54.6 24.6 - 122.0 nmol/L  Progesterone    Collection Time: 02/22/23  9:23 AM  Result Value Ref Range   Progesterone  1.0 ng/mL  Thyroid  Panel With TSH   Collection Time: 02/22/23  9:23 AM  Result Value Ref Range   TSH 1.170 0.450 - 4.500 uIU/mL   T4, Total 8.4 4.5 - 12.0 ug/dL   T3 Uptake Ratio 31 24 - 39 %   Free Thyroxine Index 2.6 1.2 - 4.9  Iron Binding Cap (TIBC)([facility])   Collection Time: 02/22/23  9:23 AM  Result Value Ref Range   Total Iron Binding Capacity 344 250 - 450 ug/dL   UIBC 744 868 - 574 ug/dL   Iron 89 27 - 840 ug/dL   Iron Saturation 26 15 - 55 %      Assessment & Plan:   Problem List Items Addressed This Visit       Nervous and Auditory   MS (multiple sclerosis) (HCC)   Chronic.  Controlled without medication at this time.  Labs ordered today.  Return to clinic in 6 months for reevaluation.  Call sooner if concerns arise.        Relevant Orders   Vitamin D  (25 hydroxy)   Magnesium     Other   Anxiety   Chronic.  Controlled with PRN hydroxyzine  and Klonopin .  Working with [facility] in [place]  for hormone therapy.  Needs a refill on Klonopin - refilled today.  Follow up in 3 months.  Call sooner if concerns arise.       Relevant Medications   clonazePAM  (KLONOPIN ) 0.5 MG tablet   Obesity (BMI 30.0-34.9)   Recommended eating smaller high protein, low fat meals more frequently and exercising 30 mins a day 5 times a week with a goal of 10-15lb weight loss in the next 3 months.       Mixed hyperlipidemia   Chronic.  Controlled.  Continue with current medication regimen.  Labs ordered today.  Return to clinic in 6 months for reevaluation.  Call sooner if concerns arise.        Relevant Orders   Lipid panel   Lipoprotein A (LPA)   Other Visit Diagnoses       Annual physical exam    -  Primary   Health maintenance reviewed during visit today.  Labs ordered.  Vaccines reviewed.  PAP up to date.   Relevant Orders   CBC  with Differential/Platelet   Comprehensive metabolic panel with GFR   Lipid panel   TSH     Other fatigue       Relevant Orders   Vitamin D  (25 hydroxy)   Magnesium        Follow up plan: Return in about 6 months (around 09/06/2024) for HTN, HLD, DM2 FU.   LABORATORY TESTING:  - Pap smear: up to date  IMMUNIZATIONS:   - Tdap: Tetanus vaccination status reviewed: Declined. - Influenza: Postponed to flu season - Pneumovax: Not applicable - Prevnar: Not applicable - COVID: Refused - HPV: Refused - Shingrix vaccine: Not applicable  SCREENING: -Mammogram: Not applicable  - Colonoscopy: Not applicable  - Bone Density: Not applicable  -Hearing Test: Not applicable  -Spirometry: Not applicable   PATIENT COUNSELING:   Advised to take 1 mg of folate supplement per day if capable of pregnancy.   Sexuality: Discussed sexually transmitted diseases, partner selection, use of condoms, avoidance of unintended pregnancy  and contraceptive alternatives.   Advised to avoid cigarette smoking.  I discussed with the patient that most people either abstain  from alcohol or drink within safe limits (<=14/week and <=4 drinks/occasion for males, <=7/weeks and <= 3 drinks/occasion for females) and that the risk for alcohol disorders and other health effects rises proportionally with the number of drinks per week and how often a drinker exceeds daily limits.  Discussed cessation/primary prevention of drug use and availability of treatment for abuse.   Diet: Encouraged to adjust caloric intake to maintain  or achieve ideal body weight, to reduce intake of dietary saturated fat and total fat, to limit sodium intake by avoiding high sodium foods and not adding table salt, and to maintain adequate dietary potassium and calcium preferably from fresh fruits, vegetables, and low-fat dairy products.    stressed the importance of regular exercise  Injury prevention: Discussed safety belts, safety helmets, smoke detector, smoking near bedding or upholstery.   Dental health: Discussed importance of regular tooth brushing, flossing, and dental visits.    NEXT PREVENTATIVE PHYSICAL DUE IN 1 YEAR. Return in about 6 months (around 09/06/2024) for HTN, HLD, DM2 FU.

## 2024-03-08 ENCOUNTER — Telehealth: Payer: Self-pay | Admitting: Nurse Practitioner

## 2024-03-08 NOTE — Telephone Encounter (Signed)
 Does this need to be changed or can we just schedule her for labs?

## 2024-03-08 NOTE — Telephone Encounter (Signed)
 Copied from CRM #[id number]. Topic: Appointments - Appointment Scheduling >> Mar 08, 2024 12:23 PM Turkey B wrote: Patient called in states wants to have labs done at [facility] instead of [facility], so probably needs orders changed to this and also needs to schedule lab appt

## 2024-03-08 NOTE — Telephone Encounter (Signed)
 Called patient to get her scheduled for a lab appt,

## 2024-03-08 NOTE — Telephone Encounter (Signed)
 Should just need a lab appointment scheduled. Future orders are in the patient's chart.

## 2024-03-09 NOTE — Telephone Encounter (Signed)
 Scheduled

## 2024-03-13 ENCOUNTER — Other Ambulatory Visit

## 2024-03-13 DIAGNOSIS — E782 Mixed hyperlipidemia: Secondary | ICD-10-CM

## 2024-03-13 DIAGNOSIS — Z0000 Encounter for general adult medical examination without abnormal findings: Secondary | ICD-10-CM

## 2024-03-14 ENCOUNTER — Ambulatory Visit: Payer: Self-pay | Admitting: Nurse Practitioner

## 2024-03-14 LAB — COMPREHENSIVE METABOLIC PANEL WITH GFR
ALT: 14 IU/L (ref 0–32)
AST: 17 IU/L (ref 0–40)
Albumin: 4.6 g/dL (ref 3.9–4.9)
Alkaline Phosphatase: 41 IU/L — ABNORMAL LOW (ref 44–121)
BUN/Creatinine Ratio: 14 (ref 9–23)
BUN: 10 mg/dL (ref 6–24)
Bilirubin Total: 0.7 mg/dL (ref 0.0–1.2)
CO2: 17 mmol/L — ABNORMAL LOW (ref 20–29)
Calcium: 9 mg/dL (ref 8.7–10.2)
Chloride: 100 mmol/L (ref 96–106)
Creatinine, Ser: 0.69 mg/dL (ref 0.57–1.00)
Globulin, Total: 2.3 g/dL (ref 1.5–4.5)
Glucose: 81 mg/dL (ref 70–99)
Potassium: 4.4 mmol/L (ref 3.5–5.2)
Sodium: 136 mmol/L (ref 134–144)
Total Protein: 6.9 g/dL (ref 6.0–8.5)
eGFR: 112 mL/min/1.73 (ref >59)

## 2024-03-14 LAB — LIPID PANEL
Chol/HDL Ratio: 8.1 ratio — ABNORMAL HIGH (ref 0.0–4.4)
Cholesterol, Total: 293 mg/dL — ABNORMAL HIGH (ref 100–199)
HDL: 36 mg/dL — ABNORMAL LOW (ref >39)
LDL Chol Calc (NIH): 240 mg/dL — ABNORMAL HIGH (ref 0–99)
Triglycerides: 98 mg/dL (ref 0–149)
VLDL Cholesterol Cal: 17 mg/dL (ref 5–40)

## 2024-03-14 LAB — CBC WITH DIFFERENTIAL/PLATELET
Basophils Absolute: 0.1 x10E3/uL (ref 0.0–0.2)
Basos: 1 %
EOS (ABSOLUTE): 0.1 x10E3/uL (ref 0.0–0.4)
Eos: 2 %
Hematocrit: 44.8 % (ref 34.0–46.6)
Hemoglobin: 14.8 g/dL (ref 11.1–15.9)
Immature Grans (Abs): 0 x10E3/uL (ref 0.0–0.1)
Immature Granulocytes: 0 %
Lymphocytes Absolute: 2.1 x10E3/uL (ref 0.7–3.1)
Lymphs: 32 %
MCH: 30.4 pg (ref 26.6–33.0)
MCHC: 33 g/dL (ref 31.5–35.7)
MCV: 92 fL (ref 79–97)
Monocytes Absolute: 0.5 x10E3/uL (ref 0.1–0.9)
Monocytes: 8 %
Neutrophils Absolute: 3.7 x10E3/uL (ref 1.4–7.0)
Neutrophils: 57 %
Platelets: 291 x10E3/uL (ref 150–450)
RBC: 4.87 x10E6/uL (ref 3.77–5.28)
RDW: 13.1 % (ref 11.7–15.4)
WBC: 6.5 x10E3/uL (ref 3.4–10.8)

## 2024-03-14 LAB — LIPOPROTEIN A (LPA): Lipoprotein (a): 173.8 nmol/L — ABNORMAL HIGH (ref <75.0)

## 2024-03-14 LAB — TSH: TSH: 1.11 u[IU]/mL (ref 0.450–4.500)

## 2024-03-14 LAB — MAGNESIUM: Magnesium: 2 mg/dL (ref 1.6–2.3)

## 2024-03-14 LAB — VITAMIN D 25 HYDROXY (VIT D DEFICIENCY, FRACTURES): Vit D, 25-Hydroxy: 68.9 ng/mL (ref 30.0–100.0)

## 2024-06-19 ENCOUNTER — Ambulatory Visit: Admitting: Nurse Practitioner

## 2024-06-26 ENCOUNTER — Emergency Department

## 2024-06-26 ENCOUNTER — Other Ambulatory Visit: Payer: Self-pay

## 2024-06-26 ENCOUNTER — Emergency Department
Admission: EM | Admit: 2024-06-26 | Discharge: 2024-06-26 | Disposition: A | Discharge status: Home / Self Care | Attending: Emergency Medicine | Admitting: Emergency Medicine

## 2024-06-26 DIAGNOSIS — R10A2 Flank pain, left side: Secondary | ICD-10-CM | POA: Diagnosis present

## 2024-06-26 DIAGNOSIS — N132 Hydronephrosis with renal and ureteral calculous obstruction: Secondary | ICD-10-CM | POA: Diagnosis not present

## 2024-06-26 DIAGNOSIS — N201 Calculus of ureter: Secondary | ICD-10-CM

## 2024-06-26 LAB — URINALYSIS, ROUTINE W REFLEX MICROSCOPIC
Bacteria, UA: NONE SEEN
Bilirubin Urine: NEGATIVE
Glucose, UA: NEGATIVE mg/dL
Ketones, ur: NEGATIVE mg/dL
Nitrite: NEGATIVE
Protein, ur: 100 mg/dL — AB
RBC / HPF: >50 RBC/hpf (ref 0–5)
Specific Gravity, Urine: 1.019 (ref 1.005–1.030)
pH: 6 (ref 5.0–8.0)

## 2024-06-26 LAB — BASIC METABOLIC PANEL WITH GFR
Anion gap: 16 — ABNORMAL HIGH (ref 5–15)
BUN: 10 mg/dL (ref 6–20)
CO2: 20 mmol/L — ABNORMAL LOW (ref 22–32)
Calcium: 9.4 mg/dL (ref 8.9–10.3)
Chloride: 104 mmol/L (ref 98–111)
Creatinine, Ser: 0.76 mg/dL (ref 0.44–1.00)
GFR, Estimated: >60 mL/min (ref >60)
Glucose, Bld: 121 mg/dL — ABNORMAL HIGH (ref 70–99)
Potassium: 3.7 mmol/L (ref 3.5–5.1)
Sodium: 141 mmol/L (ref 135–145)

## 2024-06-26 LAB — CBC WITH DIFFERENTIAL/PLATELET
Abs Immature Granulocytes: 0.02 K/uL (ref 0.00–0.07)
Basophils Absolute: 0.1 K/uL (ref 0.0–0.1)
Basophils Relative: 1 %
Eosinophils Absolute: 0.3 K/uL (ref 0.0–0.5)
Eosinophils Relative: 4 %
HCT: 41.1 % (ref 36.0–46.0)
Hemoglobin: 14.4 g/dL (ref 12.0–15.0)
Immature Granulocytes: 0 %
Lymphocytes Relative: 42 %
Lymphs Abs: 3.6 K/uL (ref 0.7–4.0)
MCH: 30.9 pg (ref 26.0–34.0)
MCHC: 35 g/dL (ref 30.0–36.0)
MCV: 88.2 fL (ref 80.0–100.0)
Monocytes Absolute: 0.7 K/uL (ref 0.1–1.0)
Monocytes Relative: 9 %
Neutro Abs: 3.8 K/uL (ref 1.7–7.7)
Neutrophils Relative %: 44 %
Platelets: 321 K/uL (ref 150–400)
RBC: 4.66 MIL/uL (ref 3.87–5.11)
RDW: 14 % (ref 11.5–15.5)
WBC: 8.6 K/uL (ref 4.0–10.5)
nRBC: 0 % (ref 0.0–0.2)

## 2024-06-26 LAB — POC URINE PREG, ED: Preg Test, Ur: NEGATIVE

## 2024-06-26 MED ORDER — ONDANSETRON 4 MG PO TBDP: 4.0000 mg | ORAL_TABLET | Freq: Three times a day (TID) | ORAL | 0 refills | Status: DC | PRN

## 2024-06-26 MED ORDER — ONDANSETRON HCL 4 MG/2ML IJ SOLN
4.0000 mg | Freq: Once | INTRAMUSCULAR | Status: AC
  Administered 2024-06-26: 04:00:00 4 mg via INTRAVENOUS
  Filled 2024-06-26: qty 2

## 2024-06-26 MED ORDER — OXYCODONE HCL 5 MG PO TABS: 5.0000 mg | ORAL_TABLET | Freq: Three times a day (TID) | ORAL | 0 refills | Status: DC | PRN

## 2024-06-26 MED ORDER — SODIUM CHLORIDE 0.9 % IV BOLUS
500.0000 mL | Freq: Once | INTRAVENOUS | Status: AC
  Administered 2024-06-26: 04:00:00 500 mL via INTRAVENOUS

## 2024-06-26 MED ORDER — KETOROLAC TROMETHAMINE 30 MG/ML IJ SOLN
30.0000 mg | Freq: Once | INTRAMUSCULAR | Status: AC
  Administered 2024-06-26: 04:00:00 30 mg via INTRAVENOUS
  Filled 2024-06-26: qty 1

## 2024-06-26 MED ORDER — TAMSULOSIN HCL 0.4 MG PO CAPS: 0.4000 mg | ORAL_CAPSULE | Freq: Every day | ORAL | 0 refills | Status: DC

## 2024-06-26 MED ORDER — MORPHINE SULFATE (PF) 4 MG/ML IV SOLN
4.0000 mg | Freq: Once | INTRAVENOUS | Status: AC
  Administered 2024-06-26: 04:00:00 4 mg via INTRAVENOUS
  Filled 2024-06-26: qty 1

## 2024-06-26 NOTE — ED Triage Notes (Signed)
 Pt reports left side flank pain that woke her from her sleep tonight, pt has hx kidney stones. Pt reports being unable to urinate this morning. Pt is also having n/v.

## 2024-06-26 NOTE — Discharge Instructions (Addendum)
 Take acetaminophen  650 mg and ibuprofen  400 mg every 6 hours for pain.  Take with food.\ Take oxycodone  for severe pain as directed. Take Zofran  for nausea as needed. Use Flomax  daily as prescribed.  Call Dr. Neale office this morning to discuss follow-up planning.  Thank you for choosing us  for your health care today!  Please see your primary doctor this week for a follow up appointment.   If you have any new, worsening, or unexpected symptoms call your doctor right away or come back to the emergency department for reevaluation.  It was my pleasure to care for you today.   Ginnie EDISON Cyrena, MD

## 2024-06-26 NOTE — ED Notes (Signed)
 Dad to nurses station requesting pain medication for this pt. EDP and pt's primary RN notified.

## 2024-06-26 NOTE — ED Provider Notes (Signed)
 [facility] Provider Note    Event Date/Time   First MD Initiated Contact with Patient 06/26/24 [phone]     (approximate)   History   Flank Pain   HPI  Shannon Davidson is a 43 y.o. female   Past medical history of kidney stones, hyperlipidemia, PCOS, here with acute onset left-sided flank pain rating to the left lower quadrant reminiscent of kidney pain in the past.  Sudden onset in the middle of the night woke her from sleep.  Went to bed the previous night feeling fine.  Associate with nausea.  Vomiting.  No preceding urinary symptoms dysuria frequency.  No fever.  No trauma.  Independent Historian contributed to assessment above: Father corroborates information past medical history as above  External Medical Documents Reviewed: Previous outpatient notes      Physical Exam   Triage Vital Signs: ED Triage Vitals  Encounter Vitals Group     BP 06/26/24 0338 (!) 153/107     Girls Systolic BP Percentile --      Girls Diastolic BP Percentile --      Boys Systolic BP Percentile --      Boys Diastolic BP Percentile --      Pulse Rate 06/26/24 0338 98     Resp 06/26/24 0338 19     Temp 06/26/24 0338 98.4 F (36.9 C)     Temp src --      SpO2 06/26/24 0338 97 %     Weight 06/26/24 0337 180 lb (81.6 kg)     Height 06/26/24 0337 5' 5 (1.651 m)     Head Circumference --      Peak Flow --      Pain Score 06/26/24 0337 8     Pain Loc --      Pain Education --      Exclude from Growth Chart --     Most recent vital signs: Vitals:   06/26/24 0514 06/26/24 0515  BP:    Pulse: 70 63  Resp:    Temp:    SpO2: 99% 100%    General: Awake, no distress.  CV:  Good peripheral perfusion.  Resp:  Normal effort.  Abd:  No distention.  Other:  Looks to be in pain.  Cannot find a comfortable position writhing in stretcher.  Mild left-sided CVA tenderness but a benign abdominal exam.  Vomiting.  Hypertensive.   ED Results / Procedures / Treatments    Labs (all labs ordered are listed, but only abnormal results are displayed) Labs Reviewed  BASIC METABOLIC PANEL WITH GFR - Abnormal; Notable for the following components:      Result Value   CO2 20 (*)    Glucose, Bld 121 (*)    Anion gap 16 (*)    All other components within normal limits  URINALYSIS, ROUTINE W REFLEX MICROSCOPIC - Abnormal; Notable for the following components:   Color, Urine YELLOW (*)    APPearance CLOUDY (*)    Hgb urine dipstick LARGE (*)    Protein, ur 100 (*)    Leukocytes,Ua TRACE (*)    All other components within normal limits  CBC WITH DIFFERENTIAL/PLATELET  POC URINE PREG, ED     I ordered and reviewed the above labs they are notable for cell count and electrolytes largely unremarkable, urinalysis is contaminated and not suggestive of urinary tract infection    RADIOLOGY I independently reviewed and interpreted CT of the abdomen and pelvis and see hyperdensity  in the left ureteral system consistent with ureterolithiasis I also reviewed radiologist's formal read.   PROCEDURES:  Critical Care performed: No  Procedures   MEDICATIONS ORDERED IN ED: Medications  sodium chloride  0.9 % bolus 500 mL (500 mLs Intravenous New Bag/Given 06/26/24 0410)  ondansetron  (ZOFRAN ) injection 4 mg (4 mg Intravenous Given 06/26/24 0410)  morphine  (PF) 4 MG/ML injection 4 mg (4 mg Intravenous Given 06/26/24 0410)  ketorolac  (TORADOL ) 30 MG/ML injection 30 mg (30 mg Intravenous Given 06/26/24 0410)    IMPRESSION / MDM / ASSESSMENT AND PLAN / ED COURSE  I reviewed the triage vital signs and the nursing notes.                                Patient's presentation is most consistent with acute presentation with potential threat to life or bodily function.  Differential diagnosis includes, but is not limited to, renal colic, pyelonephritis, urinary tract infection, ovarian torsion or cyst, intra-abdominal infection or obstruction   MDM:    Acute onset  severe left flank pain reminiscent of kidney stones in the past but she has experienced most likely a kidney stone today.  Check labs, urine, pregnancy, and CT renal protocol.  Give pain medications and antiemetic.  5 mm stone on left side with no concurrent infection.    I considered admission however pain very well-controlled feels much better.  Given size of stone and moderate left hydroureteronephrosis on imaging I informed patient that she should call Dr. Neale office this morning for follow-up appointment promptly.  Will prescribe Flomax , pain medications, discharge.         FINAL CLINICAL IMPRESSION(S) / ED DIAGNOSES   Final diagnoses:  Left flank pain  Ureterolithiasis     Rx / DC Orders   ED Discharge Orders          Ordered    ondansetron  (ZOFRAN -ODT) 4 MG disintegrating tablet  Every 8 hours PRN        06/26/24 0537    tamsulosin  (FLOMAX ) 0.4 MG CAPS capsule  Daily        06/26/24 0537    oxyCODONE  (ROXICODONE ) 5 MG immediate release tablet  Every 8 hours PRN        06/26/24 0537             Note:  This document was prepared using Dragon voice recognition software and may include unintentional dictation errors.    Cyrena Mylar, MD 06/26/24 [phone]

## 2024-06-28 ENCOUNTER — Telehealth: Payer: Self-pay | Admitting: Physician Assistant

## 2024-06-28 ENCOUNTER — Other Ambulatory Visit: Payer: Self-pay

## 2024-06-28 ENCOUNTER — Telehealth: Payer: Self-pay

## 2024-06-28 ENCOUNTER — Encounter: Payer: Self-pay | Admitting: Oncology

## 2024-06-28 ENCOUNTER — Ambulatory Visit: Admitting: Physician Assistant

## 2024-06-28 ENCOUNTER — Encounter: Payer: Self-pay | Admitting: Physician Assistant

## 2024-06-28 VITALS — BP 133/78 | HR 67 | Ht 65.0 in | Wt 180.0 lb

## 2024-06-28 DIAGNOSIS — Z87442 Personal history of urinary calculi: Secondary | ICD-10-CM | POA: Diagnosis not present

## 2024-06-28 DIAGNOSIS — N201 Calculus of ureter: Secondary | ICD-10-CM | POA: Diagnosis not present

## 2024-06-28 LAB — MICROSCOPIC EXAMINATION: Bacteria, UA: NONE SEEN

## 2024-06-28 LAB — URINALYSIS, COMPLETE
Bilirubin, UA: NEGATIVE
Glucose, UA: NEGATIVE
Ketones, UA: NEGATIVE
Leukocytes,UA: NEGATIVE
Nitrite, UA: NEGATIVE
Protein,UA: NEGATIVE
Specific Gravity, UA: <1.005 — ABNORMAL LOW (ref 1.005–1.030)
Urobilinogen, Ur: 0.2 mg/dL (ref 0.2–1.0)
pH, UA: 6.5 (ref 5.0–7.5)

## 2024-06-28 MED ORDER — KETOROLAC TROMETHAMINE 60 MG/2ML IM SOLN
15.0000 mg | Freq: Once | INTRAMUSCULAR | Status: AC
  Administered 2024-06-28: 10:00:00 15 mg via INTRAMUSCULAR

## 2024-06-28 NOTE — H&P (View-Only) (Signed)
 "  06/28/2024 9:25 AM   Shannon Davidson 1981/01/30 [id number]  CC: Chief Complaint  Patient presents with   Urinary Frequency   HPI: Shannon Davidson is a 43 y.o. female with PMH nephrolithiasis who presents today for ED follow-up of a 6 mm distal left ureteral stone.   She was seen in the ED 2 days ago with reports of left flank pain, nausea, and vomiting.  UA appeared contaminated, but no bacteria seen.  No white count.  Creatinine at baseline.  CT stone study showed a 5 x 6 mm distal left ureteral stone with moderate left hydroureteronephrosis and mild periureteral stranding.  Stone measures up to 1000 HU in density; approximate 12.5 cm skin to stone distance.  Today she reports constant mild nausea and occasional severe pain.  She has been taking p.o. oxycodone  as needed.  Over the last day, she has noticed increased lower abdominal pressure and urinary frequency.  She has a history of infected stones in the past and is concerned about possible infection.  She has never spontaneously passed a stone and would like to pursue definitive management.  In-office UA today positive for trace intact blood; urine microscopy pan negative.  PMH: Past Medical History:  Diagnosis Date   Babesiasis    Bartonella infection    Elevated EBV antibody titer    History of kidney stones    HLD (hyperlipidemia)    Kidney stone    Dr. Twylla   Lyme disease    Palpitations    Pap smear for cervical cancer screening 2012   Dr. Barbette   PCOS (polycystic ovarian syndrome)    Relapsing remitting multiple sclerosis 11/25/2014   Patient says that all her MS symptoms went away in 2017-2018 after she was treated for tick-bourne illnesses    Surgical History: Past Surgical History:  Procedure Laterality Date   APPENDECTOMY  09/2019   CESAREAN SECTION  2008   CYSTOSCOPY/URETEROSCOPY/HOLMIUM LASER/STENT PLACEMENT Left 12/02/2020   Procedure: CYSTOSCOPY/URETEROSCOPY/HOLMIUM LASER/STENT EXCHANGE;  Surgeon:  Twylla Glendia BROCKS, MD;  Location: [facility] ORS;  Service: Urology;  Laterality: Left;   LITHOTRIPSY     TONSILLECTOMY N/A 05/20/2016   Procedure: TONSILLECTOMY;  Surgeon: Deward Argue, MD;  Location: [facility];  Service: ENT;  Laterality: N/A;   TONSILLECTOMY      Home Medications:  Allergies as of 06/28/2024       Reactions   Egg Protein (egg White) Swelling   Influenza Vaccines Swelling        Medication List        Accurate as of June 28, 2024  9:25 AM. If you have any questions, ask your nurse or doctor.          clonazePAM  0.5 MG tablet Commonly known as: KLONOPIN  Take 1 tablet (0.5 mg total) by mouth daily as needed for anxiety.   hydrOXYzine  10 MG tablet Commonly known as: ATARAX  Take 1 tablet (10 mg total) by mouth 3 (three) times daily as needed.   ondansetron  4 MG disintegrating tablet Commonly known as: ZOFRAN -ODT Take 1 tablet (4 mg total) by mouth every 8 (eight) hours as needed for nausea or vomiting.   oxyCODONE  5 MG immediate release tablet Commonly known as: Roxicodone  Take 1 tablet (5 mg total) by mouth every 8 (eight) hours as needed for up to 12 doses.   tamsulosin  0.4 MG Caps capsule Commonly known as: FLOMAX  Take 1 capsule (0.4 mg total) by mouth daily.  Allergies:  Allergies[1]  Family History: Family History  Problem Relation Age of Onset   Heart disease Maternal Grandmother 53       s/p CABG   Dementia Maternal Grandmother    Hypertension Mother    Seizures Father    Hyperlipidemia Father    Achalasia Sister    COPD Maternal Grandfather    Diabetes Paternal Grandmother     Social History:   reports that she has never smoked. She has never used smokeless tobacco. She reports current alcohol use. She reports that she does not use drugs.  Physical Exam: BP 133/78   Pulse 67   Ht 5' 5 (1.651 m)   Wt 180 lb (81.6 kg)   BMI 29.95 kg/m   Constitutional:  Alert and oriented, no acute distress, nontoxic  appearing HEENT: [place], AT Cardiovascular: No clubbing, cyanosis, or edema Respiratory: Normal respiratory effort, no increased work of breathing Skin: No rashes, bruises or suspicious lesions Neurologic: Grossly intact, no focal deficits, moving all 4 extremities Psychiatric: Normal mood and affect  Laboratory Data: See Epic   Pertinent Imaging: Results for orders placed during the hospital encounter of 06/26/24  CT Renal Stone Study  Narrative EXAM: CT ABDOMEN AND PELVIS WITHOUT CONTRAST 06/26/2024 05:04:29 AM  TECHNIQUE: CT of the abdomen and pelvis was performed without the administration of intravenous contrast. Multiplanar reformatted images are provided for review. Automated exposure control, iterative reconstruction, and/or weight-based adjustment of the mA/kV was utilized to reduce the radiation dose to as low as reasonably achievable.  COMPARISON: CT of the abdomen and pelvis dated 09/07/2022.  CLINICAL HISTORY: Abdominal/flank pain, stone suspected.  FINDINGS:  LOWER CHEST: Minimal atelectasis dependently within the lower lobes bilaterally.  LIVER: The liver is unremarkable.  GALLBLADDER AND BILE DUCTS: Gallbladder is unremarkable. No biliary ductal dilatation.  SPLEEN: No acute abnormality.  PANCREAS: No acute abnormality.  ADRENAL GLANDS: No acute abnormality.  KIDNEYS, URETERS AND BLADDER: There is a 6 x 5 mm calculus present in the left distal ureter on image 71 of series 2, which is causing moderate left hydroureteronephrosis and mild periureteral fatty stranding. No stones in the right kidney or ureter. No right hydronephrosis. No right perinephric or periureteral stranding. Urinary bladder is unremarkable.  GI AND BOWEL: Stomach demonstrates no acute abnormality. There is no bowel obstruction. The patient is status post appendectomy.  PERITONEUM AND RETROPERITONEUM: No ascites. No free air.  VASCULATURE: Aorta is normal in  caliber.  LYMPH NODES: No lymphadenopathy.  REPRODUCTIVE ORGANS: No acute abnormality.  BONES AND SOFT TISSUES: No acute osseous abnormality. There is a small periumbilical fat-containing hernia.  IMPRESSION: 1. 6 x 5 mm calculus in the left distal ureter causing moderate left hydroureteronephrosis and mild ureteral periureteral/fatty stranding. 2. Small periumbilical fat-containing hernia.  Electronically signed by: Evalene Coho MD 06/26/2024 05:17 AM EST RP Workstation: [id number]  I personally reviewed the images referenced above and note a 6 mm distal left ureteral stone.  Assessment & Plan:   1. Left ureteral stone (Primary) 6 mm distal left ureteral stone.  UA is bland today, though dilute.  She has a history of infected stones, so we will keep a low threshold and start her on empiric Bactrim while we await preop urine culture to result.  She would like to pursue definitive management of the stone, which I think is reasonable.  She prefers ESWL, however we discussed timing limitations with the upcoming holidays.  She is open to ureteroscopy as an alternative.  I reached  out to our MD team to discuss timing options and we will contact her when I know more.  We administered 15 mg IM Toradol  for pain control in clinic today.  We discussed continuing MET with Flomax  and treating her symptoms at home with oxycodone  and Zofran .  We discussed warning signs including fever, uncontrolled pain, or uncontrolled nausea/vomiting.  She expressed understanding. - Urinalysis, Complete - CULTURE, URINE COMPREHENSIVE - ketorolac  (TORADOL ) injection 15 mg - sulfamethoxazole-trimethoprim (BACTRIM DS) 800-160 MG tablet; Take 1 tablet by mouth 2 (two) times daily for 7 days.  Dispense: 14 tablet; Refill: 0   Return for Will call to discuss procedural options.  Lucie Hones, PA-C  [facility] [address] [place], [place] [address] [phone]      [1]  Allergies Allergen Reactions   Egg Protein (Egg White) Swelling   Influenza Vaccines Swelling   "

## 2024-06-28 NOTE — Progress Notes (Signed)
° °  [facility] Surgical Posting Form  Surgery Date: Date: 06/29/2024  Surgeon: Dr. Glendia Barba, MD  Inpt ( No  )   Outpt (Yes)   Obs ( No  )   Diagnosis: N20.1 Left Ureteral Stone  -CPT: 3522862303  Surgery: Left Ureteroscopy with Laser Lithotripsy and Stent Placement  Stop Anticoagulations: No  Cardiac/Medical/Pulmonary Clearance needed: no  *Orders entered into EPIC  Date: 06/28/2024   *Case booked in [place]  Date: 06/28/2024  *Notified pt of Surgery: Date: 06/28/2024  PRE-OP UA & CX: no  *Placed into Prior Authorization Work [place] Date: 06/28/2024  Assistant/laser/rep:no

## 2024-06-28 NOTE — Patient Instructions (Addendum)
 I will call you with your procedural options once I hear back from our MD team about availability.  In the meantime while you await your procedure, please do the following: -Stay well hydrated -Strain your urine to catch any stones that pass -Take Flomax  0.4mg  daily -Treat any pain with Advil  (ibuprofen ), Tylenol  (acetaminophen ), or oxycodone  -Treat any nausea with Zofran   Please call our office immediately (we are open 8a-5p Monday-Thursday and 8a-12p Friday) or go to the Emergency Department if you develop any of the following: -Fever/chills -Pain uncontrollable with oxycodone  -Nausea and/or vomiting uncontrollable with Zofran 

## 2024-06-28 NOTE — Progress Notes (Signed)
 "  06/28/2024 9:25 AM   Shannon Davidson 1981/01/30 [id number]  CC: Chief Complaint  Patient presents with   Urinary Frequency   HPI: Shannon Davidson is a 43 y.o. female with PMH nephrolithiasis who presents today for ED follow-up of a 6 mm distal left ureteral stone.   She was seen in the ED 2 days ago with reports of left flank pain, nausea, and vomiting.  UA appeared contaminated, but no bacteria seen.  No white count.  Creatinine at baseline.  CT stone study showed a 5 x 6 mm distal left ureteral stone with moderate left hydroureteronephrosis and mild periureteral stranding.  Stone measures up to 1000 HU in density; approximate 12.5 cm skin to stone distance.  Today she reports constant mild nausea and occasional severe pain.  She has been taking p.o. oxycodone  as needed.  Over the last day, she has noticed increased lower abdominal pressure and urinary frequency.  She has a history of infected stones in the past and is concerned about possible infection.  She has never spontaneously passed a stone and would like to pursue definitive management.  In-office UA today positive for trace intact blood; urine microscopy pan negative.  PMH: Past Medical History:  Diagnosis Date   Babesiasis    Bartonella infection    Elevated EBV antibody titer    History of kidney stones    HLD (hyperlipidemia)    Kidney stone    Dr. Twylla   Lyme disease    Palpitations    Pap smear for cervical cancer screening 2012   Dr. Barbette   PCOS (polycystic ovarian syndrome)    Relapsing remitting multiple sclerosis 11/25/2014   Patient says that all her MS symptoms went away in 2017-2018 after she was treated for tick-bourne illnesses    Surgical History: Past Surgical History:  Procedure Laterality Date   APPENDECTOMY  09/2019   CESAREAN SECTION  2008   CYSTOSCOPY/URETEROSCOPY/HOLMIUM LASER/STENT PLACEMENT Left 12/02/2020   Procedure: CYSTOSCOPY/URETEROSCOPY/HOLMIUM LASER/STENT EXCHANGE;  Surgeon:  Twylla Glendia BROCKS, MD;  Location: [facility];  Service: Urology;  Laterality: Left;   LITHOTRIPSY     TONSILLECTOMY N/A 05/20/2016   Procedure: TONSILLECTOMY;  Surgeon: Deward Argue, MD;  Location: [facility];  Service: ENT;  Laterality: N/A;   TONSILLECTOMY      Home Medications:  Allergies as of 06/28/2024       Reactions   Egg Protein (egg White) Swelling   Influenza Vaccines Swelling        Medication List        Accurate as of June 28, 2024  9:25 AM. If you have any questions, ask your nurse or doctor.          clonazePAM  0.5 MG tablet Commonly known as: KLONOPIN  Take 1 tablet (0.5 mg total) by mouth daily as needed for anxiety.   hydrOXYzine  10 MG tablet Commonly known as: ATARAX  Take 1 tablet (10 mg total) by mouth 3 (three) times daily as needed.   ondansetron  4 MG disintegrating tablet Commonly known as: ZOFRAN -ODT Take 1 tablet (4 mg total) by mouth every 8 (eight) hours as needed for nausea or vomiting.   oxyCODONE  5 MG immediate release tablet Commonly known as: Roxicodone  Take 1 tablet (5 mg total) by mouth every 8 (eight) hours as needed for up to 12 doses.   tamsulosin  0.4 MG Caps capsule Commonly known as: FLOMAX  Take 1 capsule (0.4 mg total) by mouth daily.  Allergies:  Allergies[1]  Family History: Family History  Problem Relation Age of Onset   Heart disease Maternal Grandmother 53       s/p CABG   Dementia Maternal Grandmother    Hypertension Mother    Seizures Father    Hyperlipidemia Father    Achalasia Sister    COPD Maternal Grandfather    Diabetes Paternal Grandmother     Social History:   reports that she has never smoked. She has never used smokeless tobacco. She reports current alcohol use. She reports that she does not use drugs.  Physical Exam: BP 133/78   Pulse 67   Ht 5' 5 (1.651 m)   Wt 180 lb (81.6 kg)   BMI 29.95 kg/m   Constitutional:  Alert and oriented, no acute distress, nontoxic  appearing HEENT: [place], AT Cardiovascular: No clubbing, cyanosis, or edema Respiratory: Normal respiratory effort, no increased work of breathing Skin: No rashes, bruises or suspicious lesions Neurologic: Grossly intact, no focal deficits, moving all 4 extremities Psychiatric: Normal mood and affect  Laboratory Data: See Epic   Pertinent Imaging: Results for orders placed during the hospital encounter of 06/26/24  CT Renal Stone Study  Narrative EXAM: CT ABDOMEN AND PELVIS WITHOUT CONTRAST 06/26/2024 05:04:29 AM  TECHNIQUE: CT of the abdomen and pelvis was performed without the administration of intravenous contrast. Multiplanar reformatted images are provided for review. Automated exposure control, iterative reconstruction, and/or weight-based adjustment of the mA/kV was utilized to reduce the radiation dose to as low as reasonably achievable.  COMPARISON: CT of the abdomen and pelvis dated 09/07/2022.  CLINICAL HISTORY: Abdominal/flank pain, stone suspected.  FINDINGS:  LOWER CHEST: Minimal atelectasis dependently within the lower lobes bilaterally.  LIVER: The liver is unremarkable.  GALLBLADDER AND BILE DUCTS: Gallbladder is unremarkable. No biliary ductal dilatation.  SPLEEN: No acute abnormality.  PANCREAS: No acute abnormality.  ADRENAL GLANDS: No acute abnormality.  KIDNEYS, URETERS AND BLADDER: There is a 6 x 5 mm calculus present in the left distal ureter on image 71 of series 2, which is causing moderate left hydroureteronephrosis and mild periureteral fatty stranding. No stones in the right kidney or ureter. No right hydronephrosis. No right perinephric or periureteral stranding. Urinary bladder is unremarkable.  GI AND BOWEL: Stomach demonstrates no acute abnormality. There is no bowel obstruction. The patient is status post appendectomy.  PERITONEUM AND RETROPERITONEUM: No ascites. No free air.  VASCULATURE: Aorta is normal in  caliber.  LYMPH NODES: No lymphadenopathy.  REPRODUCTIVE ORGANS: No acute abnormality.  BONES AND SOFT TISSUES: No acute osseous abnormality. There is a small periumbilical fat-containing hernia.  IMPRESSION: 1. 6 x 5 mm calculus in the left distal ureter causing moderate left hydroureteronephrosis and mild ureteral periureteral/fatty stranding. 2. Small periumbilical fat-containing hernia.  Electronically signed by: Evalene Coho MD 06/26/2024 05:17 AM EST RP Workstation: [id number]  I personally reviewed the images referenced above and note a 6 mm distal left ureteral stone.  Assessment & Plan:   1. Left ureteral stone (Primary) 6 mm distal left ureteral stone.  UA is bland today, though dilute.  She has a history of infected stones, so we will keep a low threshold and start her on empiric Bactrim while we await preop urine culture to result.  She would like to pursue definitive management of the stone, which I think is reasonable.  She prefers ESWL, however we discussed timing limitations with the upcoming holidays.  She is open to ureteroscopy as an alternative.  I reached  out to our MD team to discuss timing options and we will contact her when I know more.  We administered 15 mg IM Toradol  for pain control in clinic today.  We discussed continuing MET with Flomax  and treating her symptoms at home with oxycodone  and Zofran .  We discussed warning signs including fever, uncontrolled pain, or uncontrolled nausea/vomiting.  She expressed understanding. - Urinalysis, Complete - CULTURE, URINE COMPREHENSIVE - ketorolac  (TORADOL ) injection 15 mg - sulfamethoxazole-trimethoprim (BACTRIM DS) 800-160 MG tablet; Take 1 tablet by mouth 2 (two) times daily for 7 days.  Dispense: 14 tablet; Refill: 0   Return for Will call to discuss procedural options.  Lucie Hones, PA-C  [facility] [address] [place], [place] [address] [phone]      [1]  Allergies Allergen Reactions   Egg Protein (Egg White) Swelling   Influenza Vaccines Swelling   "

## 2024-06-28 NOTE — Telephone Encounter (Signed)
 I spoke with the patient via telephone and offered her left ureteroscopy with laser lithotripsy and stent placement with Dr. Twylla tomorrow.  She agreed.  OR orders in.  I advised her to continue managing her symptoms today and start empiric Bactrim out of an abundance of caution.  Melissa will call her with scheduling instructions and timing.  We discussed staying n.p.o. after midnight tonight.  She expressed understanding and wishes to proceed.

## 2024-06-28 NOTE — Progress Notes (Signed)
 Surgical Physician Order Form [facility] [place]  Dr. Twylla * Scheduling expectation : 06/29/2024  *Length of Case:   *Clearance needed: no  *Anticoagulation Instructions: N/A  *Aspirin Instructions: N/A  *Post-op visit Date/Instructions:  TBD  *Diagnosis: Left Ureteral Stone  *Procedure: left Ureteroscopy w/laser lithotripsy & stent placement (47643)   Additional orders: N/A  -Admit type: OUTpatient  -Anesthesia: General  -VTE Prophylaxis Standing Order SCD's       Other:   -Standing Lab Orders Per Anesthesia    Lab other: None  -Standing Test orders EKG/Chest x-ray per Anesthesia       Test other:   - Medications:  Ancef  2gm IV  -Other orders:  N/A

## 2024-06-28 NOTE — Telephone Encounter (Signed)
 Per Dr. Twylla, Patient is to be scheduled for Left Ureteroscopy with Laser Lithotripsy and Stent Placement   [name] was contacted and possible surgical dates were discussed, Friday December 19th, 2025 was agreed upon for surgery.   Patient was directed to call [phone] between 1-3pm the day before surgery to find out surgical arrival time.  Instructions were given not to eat or drink from midnight on the night before surgery and have a driver for the day of surgery. On the surgery day patient was instructed to enter through the [facility] entrance of [facility] report the Same Day Surgery desk.   Pre-Admit Testing will be in contact via phone to set up an interview with the anesthesia team to review your history and medications prior to surgery.   Reminder of this information was sent via MyChart to the patient.

## 2024-06-29 ENCOUNTER — Ambulatory Visit

## 2024-06-29 ENCOUNTER — Encounter: Admission: RE | Disposition: A | Payer: Self-pay | Source: Home / Self Care | Attending: Urology

## 2024-06-29 ENCOUNTER — Ambulatory Visit
Admission: RE | Admit: 2024-06-29 | Discharge: 2024-06-29 | Disposition: A | Discharge status: Home / Self Care | Attending: Urology | Admitting: Urology

## 2024-06-29 ENCOUNTER — Encounter: Payer: Self-pay | Admitting: Urology

## 2024-06-29 ENCOUNTER — Encounter: Payer: Self-pay | Admitting: Anesthesiology

## 2024-06-29 ENCOUNTER — Other Ambulatory Visit: Payer: Self-pay

## 2024-06-29 ENCOUNTER — Ambulatory Visit: Payer: Self-pay | Admitting: Anesthesiology

## 2024-06-29 DIAGNOSIS — N201 Calculus of ureter: Secondary | ICD-10-CM | POA: Insufficient documentation

## 2024-06-29 HISTORY — PX: CYSTOSCOPY/URETEROSCOPY/HOLMIUM LASER/STENT PLACEMENT: SHX6546

## 2024-06-29 LAB — POCT PREGNANCY, URINE: Preg Test, Ur: NEGATIVE

## 2024-06-29 SURGERY — CYSTOSCOPY/URETEROSCOPY/HOLMIUM LASER/STENT PLACEMENT
Anesthesia: General | Laterality: Left

## 2024-06-29 MED ORDER — CEFAZOLIN SODIUM-DEXTROSE 2-4 GM/100ML-% IV SOLN
2.0000 g | INTRAVENOUS | Status: AC
  Administered 2024-06-29: 2 g via INTRAVENOUS

## 2024-06-29 MED ORDER — CHLORHEXIDINE GLUCONATE 0.12 % MT SOLN
15.0000 mL | Freq: Once | OROMUCOSAL | Status: AC
  Administered 2024-06-29: 15 mL via OROMUCOSAL

## 2024-06-29 MED ORDER — EPHEDRINE SULFATE-NACL 50-0.9 MG/10ML-% IV SOSY
PREFILLED_SYRINGE | INTRAVENOUS | Status: DC | PRN
  Administered 2024-06-29: 10 mg via INTRAVENOUS
  Administered 2024-06-29: 5 mg via INTRAVENOUS

## 2024-06-29 MED ORDER — LACTATED RINGERS IV SOLN: INTRAVENOUS | Status: DC

## 2024-06-29 MED ORDER — OXYCODONE HCL 5 MG/5ML PO SOLN: 5.0000 mg | Freq: Once | ORAL | Status: AC | PRN

## 2024-06-29 MED ORDER — FENTANYL CITRATE (PF) 100 MCG/2ML IJ SOLN
INTRAMUSCULAR | Status: DC | PRN
  Administered 2024-06-29: 50 ug via INTRAVENOUS

## 2024-06-29 MED ORDER — OXYCODONE HCL 5 MG PO TABS
ORAL_TABLET | ORAL | Status: AC
  Filled 2024-06-29: qty 1

## 2024-06-29 MED ORDER — OXYCODONE HCL 5 MG PO TABS
5.0000 mg | ORAL_TABLET | Freq: Once | ORAL | Status: AC | PRN
  Administered 2024-06-29: 5 mg via ORAL

## 2024-06-29 MED ORDER — CHLORHEXIDINE GLUCONATE 0.12 % MT SOLN
OROMUCOSAL | Status: AC
  Filled 2024-06-29: qty 15

## 2024-06-29 MED ORDER — ONDANSETRON HCL 4 MG/2ML IJ SOLN
INTRAMUSCULAR | Status: DC | PRN
  Administered 2024-06-29: 4 mg via INTRAVENOUS

## 2024-06-29 MED ORDER — IOHEXOL 180 MG/ML  SOLN
INTRAMUSCULAR | Status: DC | PRN
  Administered 2024-06-29: 10 mL

## 2024-06-29 MED ORDER — OXYBUTYNIN CHLORIDE 5 MG PO TABS: ORAL_TABLET | ORAL | 0 refills | Status: DC

## 2024-06-29 MED ORDER — DROPERIDOL 2.5 MG/ML IJ SOLN
0.6250 mg | Freq: Once | INTRAMUSCULAR | Status: AC
  Administered 2024-06-29: 0.625 mg via INTRAVENOUS

## 2024-06-29 MED ORDER — LIDOCAINE HCL (CARDIAC) PF 100 MG/5ML IV SOSY
PREFILLED_SYRINGE | INTRAVENOUS | Status: DC | PRN
  Administered 2024-06-29: 100 mg via INTRAVENOUS

## 2024-06-29 MED ORDER — FENTANYL CITRATE (PF) 100 MCG/2ML IJ SOLN
INTRAMUSCULAR | Status: AC
  Filled 2024-06-29: qty 2

## 2024-06-29 MED ORDER — MIDAZOLAM HCL (PF) 2 MG/2ML IJ SOLN
INTRAMUSCULAR | Status: DC | PRN
  Administered 2024-06-29: 2 mg via INTRAVENOUS

## 2024-06-29 MED ORDER — ORAL CARE MOUTH RINSE: 15.0000 mL | Freq: Once | OROMUCOSAL | Status: AC

## 2024-06-29 MED ORDER — MIDAZOLAM HCL 2 MG/2ML IJ SOLN
INTRAMUSCULAR | Status: AC
  Filled 2024-06-29: qty 2

## 2024-06-29 MED ORDER — DEXAMETHASONE SOD PHOSPHATE PF 10 MG/ML IJ SOLN
INTRAMUSCULAR | Status: DC | PRN
  Administered 2024-06-29: 10 mg via INTRAVENOUS

## 2024-06-29 MED ORDER — PROPOFOL 10 MG/ML IV BOLUS
INTRAVENOUS | Status: DC | PRN
  Administered 2024-06-29: 200 mg via INTRAVENOUS

## 2024-06-29 MED ORDER — SODIUM CHLORIDE 0.9 % IR SOLN
Status: DC | PRN
  Administered 2024-06-29: 1

## 2024-06-29 MED ORDER — DROPERIDOL 2.5 MG/ML IJ SOLN
INTRAMUSCULAR | Status: AC
  Filled 2024-06-29: qty 2

## 2024-06-29 MED ORDER — FENTANYL CITRATE (PF) 100 MCG/2ML IJ SOLN
25.0000 ug | INTRAMUSCULAR | Status: DC | PRN
  Administered 2024-06-29 (×2): 50 ug via INTRAVENOUS

## 2024-06-29 MED ORDER — CEFAZOLIN SODIUM-DEXTROSE 2-4 GM/100ML-% IV SOLN
INTRAVENOUS | Status: AC
  Filled 2024-06-29: qty 100

## 2024-06-29 SURGICAL SUPPLY — 17 items
BAG DRAIN SIEMENS DORNER NS (MISCELLANEOUS) ×1 IMPLANT
BASKET ZERO TIP 1.9FR (BASKET) IMPLANT
BRUSH SCRUB EZ 4% CHG (MISCELLANEOUS) ×1 IMPLANT
DRAPE UTILITY 15X26 TOWEL STRL (DRAPES) ×1 IMPLANT
FIBER LASER MOSES 365 DFL (Laser) IMPLANT
GLOVE BIOGEL PI IND STRL 7.5 (GLOVE) ×1 IMPLANT
GOWN STRL REUS W/ TWL LRG LVL3 (GOWN DISPOSABLE) ×1 IMPLANT
GOWN STRL REUS W/ TWL XL LVL3 (GOWN DISPOSABLE) ×1 IMPLANT
GUIDEWIRE STR DUAL SENSOR (WIRE) ×1 IMPLANT
KIT TURNOVER CYSTO (KITS) ×1 IMPLANT
PACK CYSTO AR (MISCELLANEOUS) ×1 IMPLANT
SOL .9 NS 3000ML IRR UROMATIC (IV SOLUTION) ×1 IMPLANT
SOLN STERILE WATER 500 ML (IV SOLUTION) ×1 IMPLANT
STENT URET 6FRX24 CONTOUR (STENTS) IMPLANT
SURGILUBE 2OZ TUBE FLIPTOP (MISCELLANEOUS) ×1 IMPLANT
SYR 20ML LL LF (SYRINGE) IMPLANT
SYRINGE TOOMEY IRRIG 70ML (MISCELLANEOUS) IMPLANT

## 2024-06-29 NOTE — Anesthesia Preprocedure Evaluation (Signed)
 "                                  Anesthesia Evaluation  Patient identified by MRN, date of birth, ID band Patient awake    Reviewed: Allergy & Precautions, NPO status , Patient's Chart, lab work & pertinent test results  History of Anesthesia Complications Negative for: history of anesthetic complications  Airway Mallampati: III  TM Distance: >3 FB Neck ROM: full    Dental  (+) Chipped   Pulmonary neg pulmonary ROS, neg shortness of breath   Pulmonary exam normal        Cardiovascular Exercise Tolerance: Good (-) angina (-) Past MI Normal cardiovascular exam     Neuro/Psych  Headaches  negative psych ROS   GI/Hepatic negative GI ROS, Neg liver ROS,,,  Endo/Other  negative endocrine ROS    Renal/GU Renal disease     Musculoskeletal   Abdominal   Peds  Hematology negative hematology ROS (+)   Anesthesia Other Findings Past Medical History: No date: Babesiasis No date: Bartonella infection No date: Elevated EBV antibody titer No date: History of kidney stones No date: HLD (hyperlipidemia) No date: Kidney stone     Comment:  Dr. Twylla No date: Lyme disease No date: Palpitations 2012: Pap smear for cervical cancer screening     Comment:  Dr. Barbette No date: PCOS (polycystic ovarian syndrome) 11/25/2014: Relapsing remitting multiple sclerosis     Comment:  Patient says that all her MS symptoms went away in               2017-2018 after she was treated for tick-bourne illnesses  Past Surgical History: 09/2019: APPENDECTOMY 2008: CESAREAN SECTION 12/02/2020: CYSTOSCOPY/URETEROSCOPY/HOLMIUM LASER/STENT PLACEMENT; Left     Comment:  Procedure: CYSTOSCOPY/URETEROSCOPY/HOLMIUM LASER/STENT               EXCHANGE;  Surgeon: Twylla Glendia BROCKS, MD;  Location: [facility]              ORS;  Service: Urology;  Laterality: Left; No date: LITHOTRIPSY 05/20/2016: TONSILLECTOMY; N/A     Comment:  Procedure: TONSILLECTOMY;  Surgeon: Deward Argue, MD;                 Location: [facility];  Service: ENT;                Laterality: N/A; No date: TONSILLECTOMY  BMI    Body Mass Index: 29.95 kg/m      Reproductive/Obstetrics negative OB ROS                              Anesthesia Physical Anesthesia Plan  ASA: 2  Anesthesia Plan: General ETT   Post-op Pain Management:    Induction: Intravenous  PONV Risk Score and Plan: Ondansetron , Dexamethasone , Midazolam  and Treatment may vary due to age or medical condition  Airway Management Planned: Oral ETT  Additional Equipment:   Intra-op Plan:   Post-operative Plan: Extubation in OR  Informed Consent: I have reviewed the patients History and Physical, chart, labs and discussed the procedure including the risks, benefits and alternatives for the proposed anesthesia with the patient or authorized representative who has indicated his/her understanding and acceptance.     Dental Advisory Given  Plan Discussed with: Anesthesiologist, CRNA and Surgeon  Anesthesia Plan Comments: (Patient consented for risks of anesthesia including but not limited  to:  - adverse reactions to medications - damage to eyes, teeth, lips or other oral mucosa - nerve damage due to positioning  - sore throat or hoarseness - Damage to heart, brain, nerves, lungs, other parts of body or loss of life  Patient voiced understanding and assent.)        Anesthesia Quick Evaluation  "

## 2024-06-29 NOTE — Transfer of Care (Signed)
 Immediate Anesthesia Transfer of Care Note  Patient: Shannon Davidson  Procedure(s) Performed: CYSTOSCOPY/URETEROSCOPY/HOLMIUM LASER/STENT PLACEMENT (Left)  Patient Location: PACU  Anesthesia Type:General  Level of Consciousness: awake and alert   Airway & Oxygen Therapy: Patient Spontanous Breathing  Post-op Assessment: Report given to RN  Post vital signs: Reviewed and stable  Last Vitals:  Vitals Value Taken Time  BP 127/78 06/29/24 14:19  Temp    Pulse 78 06/29/24 14:21  Resp 16 06/29/24 14:21  SpO2 99 % 06/29/24 14:21  Vitals shown include unfiled device data.  Last Pain:  Vitals:   06/29/24 1207  TempSrc: Tympanic  PainSc: 1          Complications: No notable events documented.

## 2024-06-29 NOTE — Discharge Instructions (Signed)
 DISCHARGE INSTRUCTIONS FOR KIDNEY STONE/URETERAL STENT   MEDICATIONS:  1. Resume all your other meds from home.  2.  AZO (over-the-counter) can help with the burning/stinging when you urinate. 3.  Oxybutynin is for ladder/stent irritation if needed. Rx was sent to your pharmacy.  ACTIVITY:  1. May resume regular activities in 24 hours. 2. No driving while on narcotic pain medications  3. Drink plenty of water  4. Continue to walk at home - you can still get blood clots when you are at home, so keep active, but don't over do it.  5. May return to work/school tomorrow or when you feel ready   BATHING:  1. You can shower. 2. You have a string coming from your urethra: The stent string is attached to your ureteral stent. Do not pull on this.   SIGNS/SYMPTOMS TO CALL:  Common postoperative symptoms include urinary frequency, urgency, bladder spasm and blood in the urine  Please call us  if you have a fever greater than 101.5, uncontrolled nausea/vomiting, uncontrolled pain, dizziness, unable to urinate, excessively bloody urine, chest pain, shortness of breath, leg swelling, leg pain, or any other concerns or questions.   You can reach us  at [phone].   FOLLOW-UP:  1. You will be contacted for a postop follow-up appointment ~1 month 2. You have a string attached to your stent, you may remove it on Monday a.m 12/22. To do this, pull the string until the stent is completely removed. You may feel an odd sensation in your back.

## 2024-06-29 NOTE — Interval H&P Note (Signed)
 History and Physical Interval Note:  06/29/2024 1:11 PM  Shannon Davidson  has presented today for surgery, with the diagnosis of Left Ureteral Stone.  The various methods of treatment have been discussed with the patient and family. After consideration of risks, benefits and other options for treatment, the patient has consented to  Procedures: CYSTOSCOPY/URETEROSCOPY/HOLMIUM LASER/STENT PLACEMENT (Left) as a surgical intervention.  The patient's history has been reviewed, patient examined, no change in status, stable for surgery.  I have reviewed the patient's chart and labs.  Questions were answered to the patient's satisfaction.    CV: RRR Lungs: Clear  Shannon Davidson C Jariah Jarmon

## 2024-06-29 NOTE — Anesthesia Postprocedure Evaluation (Signed)
"   Anesthesia Post Note  Patient: Shannon Davidson  Procedure(s) Performed: CYSTOSCOPY/URETEROSCOPY/HOLMIUM LASER/STENT PLACEMENT (Left)  Patient location during evaluation: PACU Anesthesia Type: General Level of consciousness: awake and alert Pain management: pain level controlled Vital Signs Assessment: post-procedure vital signs reviewed and stable Respiratory status: spontaneous breathing, nonlabored ventilation and respiratory function stable Cardiovascular status: blood pressure returned to baseline and stable Postop Assessment: no apparent nausea or vomiting Anesthetic complications: no   No notable events documented.   Last Vitals:  Vitals:   06/29/24 1510 06/29/24 1534  BP:  116/61  Pulse: (!) 58 64  Resp: 11 17  Temp: (!) 36.4 C 36.5 C  SpO2: 92% 97%    Last Pain:  Vitals:   06/29/24 1534  TempSrc: Temporal  PainSc: 0-No pain                 Fairy POUR Jariya Reichow      "

## 2024-06-29 NOTE — Op Note (Signed)
" ° °  Preoperative diagnosis:  Left ureteral calculus  Postoperative diagnosis:  Left ureteral calculus  Procedure:  Cystoscopy Left ureteroscopy and stone removal Ureteroscopic laser lithotripsy Left ureteral stent placement (72F/24 cm) Left retrograde pyelography with interpretation  Surgeon: Glendia C. Dorsie Sethi, M.D.  Anesthesia: General  Complications: None  Intraoperative findings:  Cystoscopy: Bladder mucosa without solid or papillary lesions; UOs normal-appearing bilaterally. Ureteroscopy: Partially impacted calculus junction mid/distal ureter Left retrograde pyelography post procedure showed no filling defects, stone fragments or contrast extravasation  EBL: Minimal  Specimens: Calculus fragments for analysis   Indication: Shannon Davidson is a 43 y.o.  female with renal colic secondary to a 6 mm ureteral calculus.  Refer to H&P for details after reviewing the management options for treatment, the patient elected to proceed with the above surgical procedure(s). We have discussed the potential benefits and risks of the procedure, side effects of the proposed treatment, the likelihood of the patient achieving the goals of the procedure, and any potential problems that might occur during the procedure or recuperation. Informed consent has been obtained.  Description of procedure:  The patient was taken to the operating room and general anesthesia was induced.  The patient was placed in the dorsal lithotomy position, prepped and draped in the usual sterile fashion, and preoperative antibiotics were administered. A preoperative time-out was performed.   A 21 French cystoscope was lubricated and placed per urethra.  Panendoscopy was performed with findings as described above  Attention was directed to the left ureteral orifice and a 0.038 Sensor wire was then advanced up the ureter into the renal pelvis under fluoroscopic guidance.  A 4.5 Fr semirigid ureteroscope was then advanced  into the ureter next to the guidewire and the calculus was identified at the mid-distal ureteral junction.  The calculus was mushroom shaped and partially impacted  The stone was then dusted with a 200 m Moses holmium laser fiber at a setting of 0.3 J/40 hz.   Significant sized fragments were then removed from the ureter with a zero tip nitinol basket.  Reinspection of the ureter up to the UPJ revealed no remaining visible stones or fragments.   Retrograde pyelogram was performed with findings as described above.  A 6 F/24 cm Contour ureteral stent with tether was placed under fluoroscopic guidance.  The wire was then removed with an adequate stent curl noted in the renal pelvis as well as in the bladder.  The bladder was then emptied and the procedure ended.  The patient appeared to tolerate the procedure well and without complications.  After anesthetic reversal the patient was transported to the PACU in stable condition.   Plan: She was instructed to remove her stent on Monday 07/02/24 Postop follow-up will be scheduled in ~1 month   Glendia Barba, MD  "

## 2024-06-29 NOTE — Anesthesia Procedure Notes (Signed)
 Procedure Name: LMA Insertion Date/Time: 06/29/2024 1:31 PM  Performed by: Veronica Alm BROCKS, CRNAPre-anesthesia Checklist: Patient identified, Emergency Drugs available, Suction available and Patient being monitored Patient Re-evaluated:Patient Re-evaluated prior to induction Oxygen Delivery Method: Circle system utilized Preoxygenation: Pre-oxygenation with 100% oxygen Induction Type: IV induction Ventilation: Mask ventilation without difficulty LMA Size: 3.0 Number of attempts: 1 Placement Confirmation: positive ETCO2, CO2 detector and breath sounds checked- equal and bilateral Tube secured with: Tape

## 2024-06-30 ENCOUNTER — Encounter: Payer: Self-pay | Admitting: Urology

## 2024-07-01 ENCOUNTER — Emergency Department

## 2024-07-01 ENCOUNTER — Other Ambulatory Visit: Payer: Self-pay

## 2024-07-01 ENCOUNTER — Emergency Department: Admission: EM | Admit: 2024-07-01 | Discharge: 2024-07-01 | Disposition: A | Discharge status: Home / Self Care

## 2024-07-01 DIAGNOSIS — R1024 Suprapubic pain: Secondary | ICD-10-CM | POA: Diagnosis present

## 2024-07-01 DIAGNOSIS — G8918 Other acute postprocedural pain: Secondary | ICD-10-CM | POA: Insufficient documentation

## 2024-07-01 LAB — URINALYSIS, ROUTINE W REFLEX MICROSCOPIC
Bacteria, UA: NONE SEEN
Bilirubin Urine: NEGATIVE
Glucose, UA: NEGATIVE mg/dL
Ketones, ur: 5 mg/dL — AB
Nitrite: POSITIVE — AB
Protein, ur: >=300 mg/dL — AB
RBC / HPF: >50 RBC/hpf (ref 0–5)
Specific Gravity, Urine: 1.029 (ref 1.005–1.030)
Squamous Epithelial / HPF: 0 /HPF (ref 0–5)
WBC, UA: >50 WBC/hpf (ref 0–5)
pH: 5 (ref 5.0–8.0)

## 2024-07-01 LAB — CBC
HCT: 41.7 % (ref 36.0–46.0)
Hemoglobin: 13.5 g/dL (ref 12.0–15.0)
MCH: 30.3 pg (ref 26.0–34.0)
MCHC: 32.4 g/dL (ref 30.0–36.0)
MCV: 93.7 fL (ref 80.0–100.0)
Platelets: 282 K/uL (ref 150–400)
RBC: 4.45 MIL/uL (ref 3.87–5.11)
RDW: 14 % (ref 11.5–15.5)
WBC: 10.4 K/uL (ref 4.0–10.5)
nRBC: 0 % (ref 0.0–0.2)

## 2024-07-01 LAB — BASIC METABOLIC PANEL WITH GFR
Anion gap: 13 (ref 5–15)
BUN: 21 mg/dL — ABNORMAL HIGH (ref 6–20)
CO2: 21 mmol/L — ABNORMAL LOW (ref 22–32)
Calcium: 8.7 mg/dL — ABNORMAL LOW (ref 8.9–10.3)
Chloride: 106 mmol/L (ref 98–111)
Creatinine, Ser: 0.91 mg/dL (ref 0.44–1.00)
GFR, Estimated: >60 mL/min
Glucose, Bld: 95 mg/dL (ref 70–99)
Potassium: 4.3 mmol/L (ref 3.5–5.1)
Sodium: 140 mmol/L (ref 135–145)

## 2024-07-01 LAB — POC URINE PREG, ED: Preg Test, Ur: NEGATIVE

## 2024-07-01 MED ORDER — OXYCODONE-ACETAMINOPHEN 5-325 MG PO TABS: 1.0000 | ORAL_TABLET | Freq: Four times a day (QID) | ORAL | 0 refills | Status: AC | PRN

## 2024-07-01 MED ORDER — MORPHINE SULFATE (PF) 4 MG/ML IV SOLN
4.0000 mg | Freq: Once | INTRAVENOUS | Status: AC
  Administered 2024-07-01: 4 mg via INTRAVENOUS
  Filled 2024-07-01: qty 1

## 2024-07-01 MED ORDER — SODIUM CHLORIDE 0.9 % IV SOLN
1.0000 g | Freq: Once | INTRAVENOUS | Status: AC
  Administered 2024-07-01: 1 g via INTRAVENOUS
  Filled 2024-07-01: qty 10

## 2024-07-01 MED ORDER — ONDANSETRON HCL 4 MG/2ML IJ SOLN
4.0000 mg | Freq: Once | INTRAMUSCULAR | Status: AC
  Administered 2024-07-01: 4 mg via INTRAVENOUS
  Filled 2024-07-01: qty 2

## 2024-07-01 MED ORDER — IOHEXOL 300 MG/ML  SOLN
100.0000 mL | Freq: Once | INTRAMUSCULAR | Status: AC | PRN
  Administered 2024-07-01: 100 mL via INTRAVENOUS

## 2024-07-01 MED ORDER — SODIUM CHLORIDE 0.9 % IV BOLUS
1000.0000 mL | Freq: Once | INTRAVENOUS | Status: AC
  Administered 2024-07-01: 1000 mL via INTRAVENOUS

## 2024-07-01 MED ORDER — OXYCODONE-ACETAMINOPHEN 5-325 MG PO TABS
1.0000 | ORAL_TABLET | Freq: Once | ORAL | Status: AC
  Administered 2024-07-01: 1 via ORAL
  Filled 2024-07-01: qty 1

## 2024-07-01 NOTE — Discharge Instructions (Signed)
 Keep your stent in until tomorrow as already scheduled.  Continue your Bactrim.  Ensure adequate hydration.  Follow-up with your urologist as already scheduled.  Return if any acutely worsening symptoms or any other emergency. == RETURN PRECAUTIONS & AFTERCARE: (ENGLISH) RETURN PRECAUTIONS: Return immediately to the emergency department or see/call your doctor if you feel worse, weak or have changes in speech or vision, are short of breath, have fever, vomiting, pain, bleeding or dark stool, trouble urinating or any new issues. Return here or see/call your doctor if not improving as expected for your suspected condition. FOLLOW-UP CARE: Call your doctor and/or any doctors we referred you to for more advice and to make an appointment. Do this today, tomorrow or after the weekend. Some doctors only take PPO insurance so if you have HMO insurance you may want to contact your HMO or your regular doctor for referral to a specialist within your plan. Either way tell the doctor's office that it was a referral from the emergency department so you get the soonest possible appointment.  YOUR TEST RESULTS: Take result reports of any blood or urine tests, imaging tests and EKG's to your doctor and any referral doctor. Have any abnormal tests repeated. Your doctor or a referral doctor can let you know when this should be done. Also make sure your doctor contacts this hospital to get any test results that are not currently available such as cultures or special tests for infection and final imaging reports, which are often not available at the time you leave the ER but which may list additional important findings that are not documented on the preliminary report. BLOOD PRESSURE: If your blood pressure was greater than 120/80 have your blood pressure rechecked within 1 to 2 weeks. MEDICATION SIDE EFFECTS: Do not drive, walk, bike, take the bus, etc. if you have received or are being prescribed any sedating medications such  as those for pain or anxiety or certain antihistamines like Benadryl . If you have been give one of these here get a taxi home or have a friend drive you home. Ask your pharmacist to counsel you on potential side effects of any new medication

## 2024-07-01 NOTE — ED Triage Notes (Signed)
 Pt bladder scan x2. 47 ml and 55 mL bladder volume

## 2024-07-01 NOTE — ED Triage Notes (Signed)
 Pt arrives via POV d/t concern for increasing lower abdominal pain, bilateral flank pain, and urinary retention which started today. Had a left ureteroscopy with laser lithotripsy and stent placement d/t a 6 mm left ureteral stone 12/18. Called urology clinic - recommended ED visit and hold on removal of stent. No fevers/chills.

## 2024-07-01 NOTE — ED Provider Notes (Signed)
 "  [facility] Provider Note    Event Date/Time   First MD Initiated Contact with Patient 07/01/24 2148     (approximate)   History   Post-op Problem   HPI  Shannon Davidson is a 43 y.o. female with past medical history of PCOS, kidney stones hyperlipidemia who underwent cystoscopy, left ureteroscopy and stone removal, laser lithotripsy and left ureteral stent placement by Dr. Twylla on 06/29/2024 who represents with worsening suprapubic abdominal pain, increased urinary frequency and bilateral flank pain.  Patient states that her symptoms initially improved however she developed suprapubic pain and dysuria yesterday which is continued.  She is out of her opioid pain medication and has just been laying in bed.  She called the office today and was advised to come to the emergency department for evaluation.  The pain has moved to her bilateral flanks.  She still has the stent in place and wonders whether she can remove this.  Denies any chest pain or shortness of breath.  She presents with her father who helps contribute to the history.       Physical Exam   Triage Vital Signs: ED Triage Vitals  Encounter Vitals Group     BP 07/01/24 1923 124/71     Girls Systolic BP Percentile --      Girls Diastolic BP Percentile --      Boys Systolic BP Percentile --      Boys Diastolic BP Percentile --      Pulse Rate 07/01/24 1923 68     Resp 07/01/24 1923 18     Temp 07/01/24 1923 98.2 F (36.8 C)     Temp Source 07/01/24 1923 Oral     SpO2 07/01/24 1923 97 %     Weight 07/01/24 1928 180 lb (81.6 kg)     Height 07/01/24 1928 5' 5 (1.651 m)     Head Circumference --      Peak Flow --      Pain Score 07/01/24 1928 7     Pain Loc --      Pain Education --      Exclude from Growth Chart --     Most recent vital signs: Vitals:   07/01/24 1923  BP: 124/71  Pulse: 68  Resp: 18  Temp: 98.2 F (36.8 C)  SpO2: 97%    Nursing Triage Note reviewed. Vital signs  reviewed and patients oxygen saturation is normoxic  General: Patient is well nourished, well developed, awake and alert, appears uncomfortable Head: Normocephalic and atraumatic Eyes: Normal inspection, extraocular muscles intact, no conjunctival pallor Ear, nose, throat: Normal external exam Neck: Normal range of motion Respiratory: Patient is in no respiratory distress, lungs CTAB Cardiovascular: Patient is not tachycardic, RRR without murmur appreciated GI: Abd soft, tender to palpation suprapubically but no rebound or guarding.  She does have bilateral CVA tenderness Back: Normal inspection of the back with good strength and range of motion throughout all ext Extremities: pulses intact with good cap refills, no LE pitting edema or calf tenderness Neuro: The patient is alert and oriented to person, place, and time, appropriately conversive, with 5/5 bilat UE/LE strength, no gross motor or sensory defects noted. Coordination appears to be adequate. Skin: Warm, dry, and intact Psych: normal mood and affect, no SI or HI  ED Results / Procedures / Treatments   Labs (all labs ordered are listed, but only abnormal results are displayed) Labs Reviewed  BASIC METABOLIC PANEL WITH GFR -  Abnormal; Notable for the following components:      Result Value   CO2 21 (*)    BUN 21 (*)    Calcium 8.7 (*)    All other components within normal limits  URINALYSIS, ROUTINE W REFLEX MICROSCOPIC - Abnormal; Notable for the following components:   Color, Urine BROWN (*)    APPearance CLOUDY (*)    Hgb urine dipstick LARGE (*)    Ketones, ur 5 (*)    Protein, ur >=300 (*)    Nitrite POSITIVE (*)    Leukocytes,Ua TRACE (*)    All other components within normal limits  URINE CULTURE  CBC  POC URINE PREG, ED     EKG None  RADIOLOGY CT abd and pelvis with iv contrast: No stone and stent appears to be in good position on my independent review interpretation and radiologist agrees reports  hydronephrosis is resolving    PROCEDURES:  Critical Care performed: No  Procedures   MEDICATIONS ORDERED IN ED: Medications  cefTRIAXone  (ROCEPHIN ) 1 g in sodium chloride  0.9 % 100 mL IVPB (0 g Intravenous Stopped 07/01/24 2256)  sodium chloride  0.9 % bolus 1,000 mL (0 mLs Intravenous Stopped 07/01/24 2322)  morphine  (PF) 4 MG/ML injection 4 mg (4 mg Intravenous Given 07/01/24 2204)  ondansetron  (ZOFRAN ) injection 4 mg (4 mg Intravenous Given 07/01/24 2205)  iohexol  (OMNIPAQUE ) 300 MG/ML solution 100 mL (100 mLs Intravenous Contrast Given 07/01/24 2231)  oxyCODONE -acetaminophen  (PERCOCET/ROXICET) 5-325 MG per tablet 1 tablet (1 tablet Oral Given 07/01/24 2323)     IMPRESSION / MDM / ASSESSMENT AND PLAN / ED COURSE                                Differential diagnosis includes, but is not limited to, UTI, infected kidney stone, perinephric abscess, acute renal insufficiency electrolyte derangement  ED course: Patient presents and abdomen demonstrates no evidence of peritonitis.  She does not have a leukocytosis or an acute renal insufficiency.  Her urine however is nitrate positive with leukocytosis and does seem consistent with UTI.  On review of her urine cultures prior to this incident, she has not grown out anything.  Given her recent history I did order a CT abdomen pelvis which is pending.  Disposition pending these results.  I did make the decision to treat her with ceftriaxone  given the positive nitrates along with IV fluids morphine  and Zofran    Clinical Course as of 07/01/24 2332  Sun Jul 01, 2024  2225 Preg Test, Ur: NEGATIVE Not elevated [HD]  2226 Urinalysis, Routine w reflex microscopic -Urine, Clean Catch(!) Nitrates now positive with large amount of leukocytosis [HD]  2226 CBC No anemia or leukocytosis [HD]  2226 Basic metabolic panel(!) No elevated creatinine or large electrolyte derangements [HD]  2255 Urology paged [HD]  2259 Case discussed with Dr.  Renda and he would recommend not pulling the stent and okay to discharge she agrees with Keflex but is not overly concerned with the urine.  Agrees with sending the urinalysis for culture [HD]  2331 Upon reassessment patient resting comfortably.  It was not known to me when I discussed this with Dr. Renda however patient has been on Bactrim prescribed by Dr. Twylla.  Given this, will not prescribe any Keflex but urine was sent for culture.  Patient will continue all course of Bactrim.  I did send a small prescription of Percocet to patient's pharmacy of choice.  She will report this that is already scheduled for tomorrow.  She feels comfortable returning home. [HD]    Clinical Course User Index [HD] Nicholaus Rolland BRAVO, MD   At time of discharge there is no evidence of acute life, limb, vision, or fertility threat. Patient has stable vital signs, pain is well controlled, patient is ambulatory and p.o. tolerant.  Discharge instructions were completed using the EPIC system. I would refer you to those at this time. All warnings prescriptions follow-up etc. were discussed in detail with the patient. Patient indicates understanding and is agreeable with this plan. All questions answered.  Patient is made aware that they may return to the emergency department for any worsening or new condition or for any other emergency.   -- Risk: 5 This patient has a high risk of morbidity due to further diagnostic testing or treatment. Rationale: This patients evaluation and management involve a high risk of morbidity due to the potential severity of presenting symptoms, need for diagnostic testing, and/or initiation of treatment that may require close monitoring. The differential includes conditions with potential for significant deterioration or requiring escalation of care. Treatment decisions in the ED, including medication administration, procedural interventions, or disposition planning, reflect this level of  risk. COPA: 5 The patient has the following acute or chronic illness/injury that poses a possible threat to life or bodily function: [X] : The patient has a potentially serious acute condition or an acute exacerbation of a chronic illness requiring urgent evaluation and management in the Emergency Department. The clinical presentation necessitates immediate consideration of life-threatening or function-threatening diagnoses, even if they are ultimately ruled out.   FINAL CLINICAL IMPRESSION(S) / ED DIAGNOSES   Final diagnoses:  Suprapubic pain  Post-op pain     Rx / DC Orders   ED Discharge Orders          Ordered    oxyCODONE -acetaminophen  (PERCOCET) 5-325 MG tablet  Every 6 hours PRN        07/01/24 2320             Note:  This document was prepared using Dragon voice recognition software and may include unintentional dictation errors.   Nicholaus Rolland BRAVO, MD 07/01/24 2332  "

## 2024-07-02 ENCOUNTER — Encounter: Payer: Self-pay | Admitting: Emergency Medicine

## 2024-07-02 ENCOUNTER — Other Ambulatory Visit: Payer: Self-pay

## 2024-07-02 ENCOUNTER — Emergency Department
Admission: EM | Admit: 2024-07-02 | Discharge: 2024-07-02 | Disposition: A | Discharge status: Home / Self Care | Attending: Emergency Medicine | Admitting: Emergency Medicine

## 2024-07-02 ENCOUNTER — Ambulatory Visit: Payer: Self-pay | Admitting: Urology

## 2024-07-02 DIAGNOSIS — R1024 Suprapubic pain: Secondary | ICD-10-CM | POA: Diagnosis not present

## 2024-07-02 DIAGNOSIS — G8918 Other acute postprocedural pain: Secondary | ICD-10-CM | POA: Diagnosis not present

## 2024-07-02 DIAGNOSIS — R8289 Other abnormal findings on cytological and histological examination of urine: Secondary | ICD-10-CM | POA: Insufficient documentation

## 2024-07-02 DIAGNOSIS — R110 Nausea: Secondary | ICD-10-CM | POA: Diagnosis not present

## 2024-07-02 DIAGNOSIS — R300 Dysuria: Secondary | ICD-10-CM | POA: Diagnosis not present

## 2024-07-02 DIAGNOSIS — R10A2 Flank pain, left side: Secondary | ICD-10-CM | POA: Insufficient documentation

## 2024-07-02 DIAGNOSIS — R1032 Left lower quadrant pain: Secondary | ICD-10-CM | POA: Insufficient documentation

## 2024-07-02 LAB — URINALYSIS, ROUTINE W REFLEX MICROSCOPIC
Bacteria, UA: NONE SEEN
Bilirubin Urine: NEGATIVE
Glucose, UA: NEGATIVE mg/dL
Ketones, ur: NEGATIVE mg/dL
Leukocytes,Ua: NEGATIVE
Nitrite: POSITIVE — AB
Protein, ur: 100 mg/dL — AB
RBC / HPF: >50 RBC/hpf (ref 0–5)
Specific Gravity, Urine: 1.021 (ref 1.005–1.030)
pH: 5 (ref 5.0–8.0)

## 2024-07-02 LAB — CBC
HCT: 38.5 % (ref 36.0–46.0)
Hemoglobin: 12.7 g/dL (ref 12.0–15.0)
MCH: 30.3 pg (ref 26.0–34.0)
MCHC: 33 g/dL (ref 30.0–36.0)
MCV: 91.9 fL (ref 80.0–100.0)
Platelets: 239 K/uL (ref 150–400)
RBC: 4.19 MIL/uL (ref 3.87–5.11)
RDW: 14 % (ref 11.5–15.5)
WBC: 9.8 K/uL (ref 4.0–10.5)
nRBC: 0 % (ref 0.0–0.2)

## 2024-07-02 LAB — BASIC METABOLIC PANEL WITH GFR
Anion gap: 12 (ref 5–15)
BUN: 18 mg/dL (ref 6–20)
CO2: 20 mmol/L — ABNORMAL LOW (ref 22–32)
Calcium: 9.2 mg/dL (ref 8.9–10.3)
Chloride: 104 mmol/L (ref 98–111)
Creatinine, Ser: 1 mg/dL (ref 0.44–1.00)
GFR, Estimated: >60 mL/min
Glucose, Bld: 93 mg/dL (ref 70–99)
Potassium: 4.1 mmol/L (ref 3.5–5.1)
Sodium: 135 mmol/L (ref 135–145)

## 2024-07-02 LAB — CULTURE, URINE COMPREHENSIVE

## 2024-07-02 MED ORDER — HYDROXYZINE HCL 10 MG PO TABS: 10.0000 mg | ORAL_TABLET | Freq: Three times a day (TID) | ORAL | 0 refills | Status: AC | PRN

## 2024-07-02 MED ORDER — KETOROLAC TROMETHAMINE 30 MG/ML IJ SOLN
30.0000 mg | Freq: Once | INTRAMUSCULAR | Status: AC
  Administered 2024-07-02: 30 mg via INTRAVENOUS
  Filled 2024-07-02: qty 1

## 2024-07-02 MED ORDER — FENTANYL CITRATE (PF) 50 MCG/ML IJ SOSY
75.0000 ug | PREFILLED_SYRINGE | Freq: Once | INTRAMUSCULAR | Status: AC
  Administered 2024-07-02: 75 ug via INTRAVENOUS
  Filled 2024-07-02: qty 2

## 2024-07-02 MED ORDER — METOCLOPRAMIDE HCL 5 MG/ML IJ SOLN
10.0000 mg | Freq: Once | INTRAMUSCULAR | Status: AC
  Administered 2024-07-02: 10 mg via INTRAVENOUS
  Filled 2024-07-02: qty 2

## 2024-07-02 MED ORDER — KETOROLAC TROMETHAMINE 10 MG PO TABS: 10.0000 mg | ORAL_TABLET | Freq: Three times a day (TID) | ORAL | 0 refills | Status: AC | PRN

## 2024-07-02 MED ORDER — ONDANSETRON HCL 4 MG/2ML IJ SOLN
4.0000 mg | Freq: Once | INTRAMUSCULAR | Status: AC
  Administered 2024-07-02: 4 mg via INTRAVENOUS
  Filled 2024-07-02: qty 2

## 2024-07-02 MED ORDER — LORAZEPAM 2 MG/ML IJ SOLN
1.0000 mg | Freq: Once | INTRAMUSCULAR | Status: AC
  Administered 2024-07-02: 1 mg via INTRAVENOUS
  Filled 2024-07-02: qty 1

## 2024-07-02 NOTE — ED Provider Notes (Signed)
 "  [facility] Provider Note    Event Date/Time   First MD Initiated Contact with Patient 07/02/24 1423     (approximate)   History   Flank Pain   HPI  Shannon Davidson is a 43 y.o. female  with a past medical history of hyperlipidemia, PCOS, nephrolithiasis, MS presents to the emergency department with dysuria, left flank pain, LLQ pain that started yesterday.  She reports some nausea as well.  She underwent cystoscopy, left ureteroscopy and stone removal / laser lithotripsy and left ureteral stent placement on 12/19 with Dr. Twylla.  She was seen in the emergency department yesterday for the same concern, had a CT scan which showed left ureteral stent with interval decreased left hydroureteronephrosis, periureteral fat stranding, and left distal ureter stone no longer present.  She reports she took out her ureteral stent today as her urologist told her to.  She denies vomiting, diarrhea, chest pain, shortness of breath.  Patient is currently on Bactrim for UTI treatment. She has been taking the Percocet at home with no improvement in her pain.   Patient arrived by EMS after being given 100 mcg of fentanyl , 1 g of Tylenol  and 4 mg of Zofran  and route.  Physical Exam   Triage Vital Signs: ED Triage Vitals  Encounter Vitals Group     BP 07/02/24 1258 127/87     Girls Systolic BP Percentile --      Girls Diastolic BP Percentile --      Boys Systolic BP Percentile --      Boys Diastolic BP Percentile --      Pulse Rate 07/02/24 1258 78     Resp 07/02/24 1258 18     Temp 07/02/24 1258 98.6 F (37 C)     Temp Source 07/02/24 1258 Oral     SpO2 07/02/24 1258 98 %     Weight 07/02/24 1257 180 lb (81.6 kg)     Height 07/02/24 1257 5' 5 (1.651 m)     Head Circumference --      Peak Flow --      Pain Score 07/02/24 1253 7     Pain Loc --      Pain Education --      Exclude from Growth Chart --     Most recent vital signs: Vitals:   07/02/24 1258  BP: 127/87   Pulse: 78  Resp: 18  Temp: 98.6 F (37 C)  SpO2: 98%    General: Awake, in no acute distress. Appears stated age. Neck: Supple. CV: Good peripheral perfusion.  Respiratory:Normal respiratory effort.  No respiratory distress.  GI: Soft, non-distended, TTP in LLQ, suprapubic and left flank region. No CVA tenderness b/l. Skin:Warm, dry, intact. Neurological: A&Ox4 to person, place, time, and situation.   ED Results / Procedures / Treatments   Labs (all labs ordered are listed, but only abnormal results are displayed) Labs Reviewed  URINALYSIS, ROUTINE W REFLEX MICROSCOPIC - Abnormal; Notable for the following components:      Result Value   Color, Urine AMBER (*)    APPearance CLOUDY (*)    Hgb urine dipstick LARGE (*)    Protein, ur 100 (*)    Nitrite POSITIVE (*)    All other components within normal limits  BASIC METABOLIC PANEL WITH GFR - Abnormal; Notable for the following components:   CO2 20 (*)    All other components within normal limits  CBC  POC URINE PREG, ED  EKG     RADIOLOGY    PROCEDURES:  Critical Care performed: No   Procedures   MEDICATIONS ORDERED IN ED: Medications  fentaNYL  (SUBLIMAZE ) injection 75 mcg (75 mcg Intravenous Given 07/02/24 1500)  ondansetron  (ZOFRAN ) injection 4 mg (4 mg Intravenous Given 07/02/24 1500)  ketorolac  (TORADOL ) 30 MG/ML injection 30 mg (30 mg Intravenous Given 07/02/24 1531)  metoCLOPramide  (REGLAN ) injection 10 mg (10 mg Intravenous Given 07/02/24 1617)  LORazepam  (ATIVAN ) injection 1 mg (1 mg Intravenous Given 07/02/24 1641)     IMPRESSION / MDM / ASSESSMENT AND PLAN / ED COURSE  I reviewed the triage vital signs and the nursing notes.                              Differential diagnosis includes, but is not limited to, complication/pain post ureteral stent removal, ureteral spasm, AKI, UTI, electrolyte abnormality  Patient's presentation is most consistent with acute complicated illness / injury  requiring diagnostic workup.  Clinical Course as of 07/02/24 1803  Mon Jul 02, 2024  1502 Patient here with LLQ and left flank pain after pulling out ureteral stent today which she had placed Friday. She appears uncomfortable on exam. CBC unremarkable w/ a normal WBC count of 9.8.  BMP with CO2 of 20, otherwise unremarkable.  UA with large hemoglobin, proteinuria, positive nitrites, 11-20 white blood cells, >50 RBCs; already being treated for UTI w/ Bactrim.  Will provide fentanyl  70 mcg and Zofran , and recheck her pain.  Patient discussed with supervising physician, Dr. Lamar Price and oncoming physician Dr. Guadalupe Eagles.   She had a CT abd/pelvis scan yesterday after being seen for the same concern with periureteral fat stranding, left distal stone no longer visualized and was discharged after pain was controlled, on abx already for UTI. Will hold off on CT scan for now. Urine culture from yesterday still in process. [SD]  1551 Fentanyl  helped for a few minutes, then pain started again per RN. Patient asking for Toradol , which was given. Will re-evaluate pain in a bit after Toradol  admin. Consulting urology. Considering admission for pain control.  [SD]  1603 Pain is improved with Toradol  and patient feels her pain is controlled at this time.  Next urology appointment is on 1/20 w/ Dr. Twylla.  Patient has some concerns with going home given her returning to the ER twice for pain control.  She is also still nauseous, will trial another nausea medication (Reglan ). [SD]  1635 Patient feeling like her skin is crawling after Reglan  admin and is asking for something for anxiety, will give 1 dose Ativan . She feels her pain is tolerable now and is agreeable to going home on oral Toradol .  Discussed patient with urology on-call Dr. Francisca and he agrees on oral Toradol  use at home should work well, and agrees with not repeating her CT scan given she received one yesterday, is afebrile with normal WBC count.  Dr. Francisca told me he will contact his nursing staff and asked them to give the patient a call tomorrow to make sure they are improving.  Will also place another urology referral for patient to get an earlier follow up appointment outpatient.  Provided her oral Toradol  and hydroxyzine  prescriptions and discussed precautions regarding taking these medications.  She does understand and agree to come back if her pain is worsening, she starts vomiting, or for any other new, concerning or worsening symptoms.  [SD]    Clinical  Course User Index [SD] Sheron Salm, PA-C   The patient may return to the emergency department for any new, worsening, or concerning symptoms. Patient was given the opportunity to ask questions; all questions were answered. Emergency department return precautions were discussed with the patient.  Patient is in agreement to the treatment plan.  Patient is stable for discharge.   FINAL CLINICAL IMPRESSION(S) / ED DIAGNOSES   Final diagnoses:  Post-op pain  Suprapubic pain, acute     Rx / DC Orders   ED Discharge Orders          Ordered    hydrOXYzine  (ATARAX ) 10 MG tablet  Every 8 hours PRN        07/02/24 1641    ketorolac  (TORADOL ) 10 MG tablet  Every 8 hours PRN        07/02/24 1641             Note:  This document was prepared using Dragon voice recognition software and may include unintentional dictation errors.     Sheron Salm, PA-C 07/02/24 1803    Floy Roberts, MD 07/02/24 [phone]  "

## 2024-07-02 NOTE — ED Notes (Signed)
 States she is feeling better  States the pain has eased off but thinks she I having a rxn to the Reglan   Feels like skin is crawling   Provider aware

## 2024-07-02 NOTE — ED Notes (Signed)
 See triage note  Presents with severe left flank pain  States she was instructed to remove the stent today Had surgery 3 days ago States this pain is worse than the initial kidney stone

## 2024-07-02 NOTE — Discharge Instructions (Addendum)
 You are seen in the emergency department for pain after your stent.  Please pick up and take the medications as prescribed.    Please do not drive, drink alcohol, make any legal binding decisions, work or climb the ladders or heights while taking the hydroxyzine .    If you experience stomach cramping, please stop taking the Toradol .  Do not take any NSAIDs such as ibuprofen , Advil , Aleve, aspirin or naproxen while taking this medication.  You may continue to take your Percocet at home as prescribed yesterday. Continue your Bactrim.  Please follow-up with the urologist (Dr. Francisca) following today's visit.  Call his office to see if there is an earlier appointment available.  Also follow-up with your primary care provider following today's visit as soon as possible.  Return to the emergency department for any worsening pain, vomiting, or any other new or worsening symptoms.SABRA

## 2024-07-02 NOTE — ED Triage Notes (Signed)
 Pt via [facility] from home. Pt c/o L flank pain after she was instructed to removed her urinary stent that was placed after her surgery on Friday. She had the pain immediately. Reports some nausea without vomiting. Pt was seen last night for possible urinary retention. EMS gave 100mcg of Fentanyl , 1g of Tylenol , and 4mg  of Zofran . Pt is A&Ox4 and NAD

## 2024-07-03 ENCOUNTER — Telehealth: Payer: Self-pay

## 2024-07-03 LAB — URINE CULTURE: Culture: NO GROWTH

## 2024-07-03 NOTE — Telephone Encounter (Signed)
 Called to follow up with patient after he ER visit for post-op stent removal pain. Pt stated this morning she feels much better and she is able to use the bathroom better. Pt states she is taking the oral toradol  and ibuprofen  for the pain and it is helping. I did suggest using Alieve instead of the ibuprofen  to get a linger acting Nsaid. Pt voiced understanding and will keep her 1 month appt.

## 2024-07-04 ENCOUNTER — Emergency Department

## 2024-07-04 ENCOUNTER — Emergency Department: Admission: EM | Admit: 2024-07-04 | Discharge: 2024-07-04 | Disposition: A | Discharge status: Home / Self Care

## 2024-07-04 ENCOUNTER — Encounter: Payer: Self-pay | Admitting: Emergency Medicine

## 2024-07-04 ENCOUNTER — Other Ambulatory Visit: Payer: Self-pay

## 2024-07-04 DIAGNOSIS — Z9889 Other specified postprocedural states: Secondary | ICD-10-CM | POA: Diagnosis not present

## 2024-07-04 DIAGNOSIS — R3911 Hesitancy of micturition: Secondary | ICD-10-CM | POA: Insufficient documentation

## 2024-07-04 DIAGNOSIS — R1024 Suprapubic pain: Secondary | ICD-10-CM | POA: Insufficient documentation

## 2024-07-04 DIAGNOSIS — R300 Dysuria: Secondary | ICD-10-CM | POA: Insufficient documentation

## 2024-07-04 LAB — BASIC METABOLIC PANEL WITH GFR
Anion gap: 11 (ref 5–15)
BUN: 19 mg/dL (ref 6–20)
CO2: 24 mmol/L (ref 22–32)
Calcium: 9 mg/dL (ref 8.9–10.3)
Chloride: 104 mmol/L (ref 98–111)
Creatinine, Ser: 1.04 mg/dL — ABNORMAL HIGH (ref 0.44–1.00)
GFR, Estimated: >60 mL/min
Glucose, Bld: 98 mg/dL (ref 70–99)
Potassium: 4.1 mmol/L (ref 3.5–5.1)
Sodium: 139 mmol/L (ref 135–145)

## 2024-07-04 LAB — URINALYSIS, ROUTINE W REFLEX MICROSCOPIC
Bacteria, UA: NONE SEEN
Bilirubin Urine: NEGATIVE
Glucose, UA: NEGATIVE mg/dL
Ketones, ur: NEGATIVE mg/dL
Leukocytes,Ua: NEGATIVE
Nitrite: POSITIVE — AB
Protein, ur: 100 mg/dL — AB
RBC / HPF: >50 RBC/hpf (ref 0–5)
Specific Gravity, Urine: 1.021 (ref 1.005–1.030)
WBC, UA: >50 WBC/hpf (ref 0–5)
pH: 5 (ref 5.0–8.0)

## 2024-07-04 LAB — CBC
HCT: 37.2 % (ref 36.0–46.0)
Hemoglobin: 12.4 g/dL (ref 12.0–15.0)
MCH: 30.8 pg (ref 26.0–34.0)
MCHC: 33.3 g/dL (ref 30.0–36.0)
MCV: 92.3 fL (ref 80.0–100.0)
Platelets: 258 K/uL (ref 150–400)
RBC: 4.03 MIL/uL (ref 3.87–5.11)
RDW: 13.6 % (ref 11.5–15.5)
WBC: 6.6 K/uL (ref 4.0–10.5)
nRBC: 0 % (ref 0.0–0.2)

## 2024-07-04 MED ORDER — SODIUM CHLORIDE 0.9 % IV SOLN: Freq: Once | INTRAVENOUS | Status: AC

## 2024-07-04 MED ORDER — OXYCODONE-ACETAMINOPHEN 5-325 MG PO TABS
1.0000 | ORAL_TABLET | ORAL | Status: DC | PRN
  Administered 2024-07-04: 1 via ORAL
  Filled 2024-07-04: qty 1

## 2024-07-04 MED ORDER — OXYBUTYNIN CHLORIDE 5 MG PO TABS: ORAL_TABLET | ORAL | 0 refills | Status: DC

## 2024-07-04 MED ORDER — OXYBUTYNIN CHLORIDE 5 MG PO TABS
5.0000 mg | ORAL_TABLET | Freq: Once | ORAL | Status: AC
  Administered 2024-07-04: 5 mg via ORAL
  Filled 2024-07-04: qty 1

## 2024-07-04 MED ORDER — SODIUM CHLORIDE 0.9 % IV SOLN
2.0000 g | Freq: Once | INTRAVENOUS | Status: AC
  Administered 2024-07-04: 2 g via INTRAVENOUS
  Filled 2024-07-04: qty 20

## 2024-07-04 NOTE — ED Provider Notes (Signed)
 "  [facility] Provider Note    Event Date/Time   First MD Initiated Contact with Patient 07/04/24 0425     (approximate)   History   Dysuria   HPI  Shannon Davidson is a 43 y.o. female with a history of PCOS, kidney stones hyperlipidemia, who underwent cystoscopy, left ureteroscopy and stone removal, laser lithotripsy and left ureteral stent placement by Dr. Twylla on 06/29/2024 who represents with worsening suprapubic abdominal pain who presents with her third ED visit in 3 days this time for suprapubic pain, urinary hesitancy.  Patient tells me that since pulling her stent she feels as if she has not been able to urinate completely and wonders whether she is retaining.  She reports that she is still taking her Bactrim.  Denies any fevers or chills, cough congestion, changes in bowel habits.  She presents with her mother who contributes to history      Physical Exam   Triage Vital Signs: ED Triage Vitals  Encounter Vitals Group     BP 07/04/24 0104 139/66     Girls Systolic BP Percentile --      Girls Diastolic BP Percentile --      Boys Systolic BP Percentile --      Boys Diastolic BP Percentile --      Pulse Rate 07/04/24 0104 89     Resp 07/04/24 0104 18     Temp 07/04/24 0104 98.6 F (37 C)     Temp Source 07/04/24 0104 Oral     SpO2 07/04/24 0104 95 %     Weight 07/04/24 0104 180 lb (81.6 kg)     Height 07/04/24 0104 5' 5 (1.651 m)     Head Circumference --      Peak Flow --      Pain Score 07/04/24 0124 6     Pain Loc --      Pain Education --      Exclude from Growth Chart --     Most recent vital signs: Vitals:   07/04/24 0104 07/04/24 0453  BP: 139/66 130/72  Pulse: 89 (!) 58  Resp: 18 18  Temp: 98.6 F (37 C)   SpO2: 95% 95%    Nursing Triage Note reviewed. Vital signs reviewed and patients oxygen saturation is normoxic  General: Patient is well nourished, well developed, awake and alert, resting comfortably in no acute  distress Head: Normocephalic and atraumatic Eyes: Normal inspection, extraocular muscles intact, no conjunctival pallor Ear, nose, throat: Normal external exam Neck: Normal range of motion Respiratory: Patient is in no respiratory distress, lungs CTAB Cardiovascular: Patient is not tachycardic, RRR without murmur appreciated GI: Abd SNT with no guarding or rebound, no CVA ttp Back: Normal inspection of the back with good strength and range of motion throughout all ext Extremities: pulses intact with good cap refills, no LE pitting edema or calf tenderness Neuro: The patient is alert and oriented to person, place, and time, appropriately conversive, with 5/5 bilat UE/LE strength, no gross motor or sensory defects noted. Coordination appears to be adequate. Skin: Warm, dry, and intact Psych: normal mood and affect, no SI or HI  ED Results / Procedures / Treatments   Labs (all labs ordered are listed, but only abnormal results are displayed) Labs Reviewed  URINALYSIS, ROUTINE W REFLEX MICROSCOPIC - Abnormal; Notable for the following components:      Result Value   Color, Urine AMBER (*)    APPearance HAZY (*)  Hgb urine dipstick MODERATE (*)    Protein, ur 100 (*)    Nitrite POSITIVE (*)    All other components within normal limits  BASIC METABOLIC PANEL WITH GFR - Abnormal; Notable for the following components:   Creatinine, Ser 1.04 (*)    All other components within normal limits  URINE CULTURE  CBC     EKG None  RADIOLOGY US  renal: No acute abnormality on my independent review interpretation radiologist agrees    PROCEDURES:  Critical Care performed: No  Procedures   MEDICATIONS ORDERED IN ED: Medications  oxyCODONE -acetaminophen  (PERCOCET/ROXICET) 5-325 MG per tablet 1 tablet (1 tablet Oral Given 07/04/24 0310)  0.9 %  sodium chloride  infusion (0 mLs Intravenous Stopped 07/04/24 0430)  cefTRIAXone  (ROCEPHIN ) 2 g in sodium chloride  0.9 % 100 mL IVPB (0 g  Intravenous Stopped 07/04/24 0518)  oxybutynin  (DITROPAN ) tablet 5 mg (5 mg Oral Given 07/04/24 0536)     IMPRESSION / MDM / ASSESSMENT AND PLAN / ED COURSE                                Differential diagnosis includes, but is not limited to, acute renal insufficiency, hydronephrosis, electrolyte derangement, urinary retention  ED course: Patient is well-appearing and abdomen demonstrates no evidence of peritonitis.  I reviewed the culture data from her urine over the past week and nothing has grown out.  She has no bacteria again in today's urinalysis.  Ceftriaxone  was ordered upfront in triage which was completed prior to my assessment.  Bladder scan demonstrated no retention of urine.  Patient's creatinine is only 1.04 today I suspect she is dehydrated.  She received 1 L of IV fluids.  She had no leukocytosis and renal ultrasound demonstrated no hydronephrosis.  Patient does state that she ran out of her Ditropan  and I do think not having this is contributing to her symptoms.  I have given her we refill.  Patient was reassured and felt comfortable returning home at this point   Clinical Course as of 07/04/24 0650  Wed Jul 04, 2024  0426 Urinalysis, Routine w reflex microscopic -Urine, Clean Catch(!) No bacteria seen, slightly more white blood cells than several days ago [HD]  0505 Urine cultures have not grown out anything [HD]  9478 Patient feels reassured and feels comfortable returning home after results.  Will send her with a prescription of oxybutynin  as she has run out.  She will follow-up with Dr. Twylla and continue her Bactrim [HD]    Clinical Course User Index [HD] Nicholaus Rolland BRAVO, MD   At time of discharge there is no evidence of acute life, limb, vision, or fertility threat. Patient has stable vital signs, pain is well controlled, patient is ambulatory and p.o. tolerant.  Discharge instructions were completed using the EPIC system. I would refer you to those at this time.  All warnings prescriptions follow-up etc. were discussed in detail with the patient. Patient indicates understanding and is agreeable with this plan. All questions answered.  Patient is made aware that they may return to the emergency department for any worsening or new condition or for any other emergency.   -- Risk: 5 This patient has a high risk of morbidity due to further diagnostic testing or treatment. Rationale: This patients evaluation and management involve a high risk of morbidity due to the potential severity of presenting symptoms, need for diagnostic testing, and/or initiation of treatment that may  require close monitoring. The differential includes conditions with potential for significant deterioration or requiring escalation of care. Treatment decisions in the ED, including medication administration, procedural interventions, or disposition planning, reflect this level of risk. COPA: 5 The patient has the following acute or chronic illness/injury that poses a possible threat to life or bodily function: [X] : The patient has a potentially serious acute condition or an acute exacerbation of a chronic illness requiring urgent evaluation and management in the Emergency Department. The clinical presentation necessitates immediate consideration of life-threatening or function-threatening diagnoses, even if they are ultimately ruled out.   FINAL CLINICAL IMPRESSION(S) / ED DIAGNOSES   Final diagnoses:  Urinary hesitancy  Postoperative state     Rx / DC Orders   ED Discharge Orders          Ordered    oxybutynin  (DITROPAN ) 5 MG tablet        07/04/24 0524             Note:  This document was prepared using Dragon voice recognition software and may include unintentional dictation errors.   Nicholaus Rolland BRAVO, MD 07/04/24 [phone]  "

## 2024-07-04 NOTE — ED Notes (Signed)
 ED Provider at bedside.

## 2024-07-04 NOTE — ED Notes (Addendum)
 Straight catheterization  performed and 100 urine output noted. Pt denies relief afterwards, pt voices concern for making only 100 mL of urine in 12 hour period when she has been drinking water all day. Pt requesting a CT scan. Nicholaus, MD notified. Pt sent back to waiting room until treatment room available.   2 green tops and 1 lav top tube collected, labeled, and sent to lab.

## 2024-07-04 NOTE — Discharge Instructions (Signed)
 Continue your Bactrim, ensure adequate hydration.  Take your oxybutynin .  The patient as your symptoms may take 2 weeks to resolve after will surgical intervention.  Follow-up with your urologist and return with any acutely worsening symptoms. -- RETURN PRECAUTIONS & AFTERCARE: (ENGLISH) RETURN PRECAUTIONS: Return immediately to the emergency department or see/call your doctor if you feel worse, weak or have changes in speech or vision, are short of breath, have fever, vomiting, pain, bleeding or dark stool, trouble urinating or any new issues. Return here or see/call your doctor if not improving as expected for your suspected condition. FOLLOW-UP CARE: Call your doctor and/or any doctors we referred you to for more advice and to make an appointment. Do this today, tomorrow or after the weekend. Some doctors only take PPO insurance so if you have HMO insurance you may want to contact your HMO or your regular doctor for referral to a specialist within your plan. Either way tell the doctor's office that it was a referral from the emergency department so you get the soonest possible appointment.  YOUR TEST RESULTS: Take result reports of any blood or urine tests, imaging tests and EKG's to your doctor and any referral doctor. Have any abnormal tests repeated. Your doctor or a referral doctor can let you know when this should be done. Also make sure your doctor contacts this hospital to get any test results that are not currently available such as cultures or special tests for infection and final imaging reports, which are often not available at the time you leave the ER but which may list additional important findings that are not documented on the preliminary report. BLOOD PRESSURE: If your blood pressure was greater than 120/80 have your blood pressure rechecked within 1 to 2 weeks. MEDICATION SIDE EFFECTS: Do not drive, walk, bike, take the bus, etc. if you have received or are being prescribed any sedating  medications such as those for pain or anxiety or certain antihistamines like Benadryl . If you have been give one of these here get a taxi home or have a friend drive you home. Ask your pharmacist to counsel you on potential side effects of any new medication

## 2024-07-04 NOTE — ED Triage Notes (Signed)
 Pt arrives POV reporting urinary frequency and urgency w/ little output since noon 12/23.  Pt removed urinary stent on Friday and seen here on 12/22.

## 2024-07-04 NOTE — ED Notes (Signed)
 Pt concerned she may be on wrong antibiotic. Pt had urine culture on previous visit but have no heard back from anyone if she is on the correct antibiotic. Will address these concerns to next primary RN and provider. Pt in triage waiting receiving fluids and awaiting US . Pt also reports while sitting in waiting her pain in lower abdomen and bilateral flank pain has increased.

## 2024-07-04 NOTE — ED Notes (Addendum)
 Pt requesting straight catheterization for relief; ok per Nicholaus, MD   Bladder scan shows >103 mL

## 2024-07-05 LAB — URINE CULTURE: Culture: NO GROWTH

## 2024-07-13 LAB — STONE ANALYSIS
Calcium Oxalate Dihydrate: 40 %
Calcium Oxalate Monohydrate: 60 %
Weight Calculi: 21 mg

## 2024-07-18 ENCOUNTER — Telehealth: Admitting: Nurse Practitioner

## 2024-07-18 VITALS — BP 122/70 | HR 75 | Ht 65.0 in | Wt 185.0 lb

## 2024-07-18 DIAGNOSIS — F419 Anxiety disorder, unspecified: Secondary | ICD-10-CM

## 2024-07-18 DIAGNOSIS — E66811 Obesity, class 1: Secondary | ICD-10-CM | POA: Diagnosis not present

## 2024-07-18 DIAGNOSIS — Z6830 Body mass index (BMI) 30.0-30.9, adult: Secondary | ICD-10-CM | POA: Diagnosis not present

## 2024-07-18 MED ORDER — CLONAZEPAM 0.5 MG PO TABS: 0.5000 mg | ORAL_TABLET | Freq: Every day | ORAL | 0 refills | Status: AC | PRN

## 2024-07-18 MED ORDER — PHENTERMINE HCL 15 MG PO CAPS: 15.0000 mg | ORAL_CAPSULE | ORAL | 0 refills | Status: AC

## 2024-07-18 NOTE — Assessment & Plan Note (Signed)
 Chronic.  Overall controlled with PRN clonazepam . 1 refill lasted about 4-5 months.  Aware of risks of taking Benzodiazepine.  PDMP checked.  Continue with current medication regimen.  Labs ordered today.  Return to clinic in 6 months for reevaluation.  Call sooner if concerns arise.

## 2024-07-18 NOTE — Assessment & Plan Note (Signed)
 Recommended eating smaller high protein, low fat meals more frequently and exercising 30 mins a day 5 times a week with a goal of 10-15lb weight loss in the next 3 months. Will start Phentermine  15mg .  Side effects and benefits of medication discussed during visit.  Follow up in 1 month.  Call sooner if concerns arise.

## 2024-07-18 NOTE — Progress Notes (Signed)
 "  BP 122/70 (BP Location: Left Arm, Patient Position: Sitting)   Pulse 75   Ht 5' 5 (1.651 m)   Wt 185 lb (83.9 kg)   LMP 06/24/2024   BMI 30.79 kg/m    Subjective:    Patient ID: Shannon Davidson, female    DOB: May 27, 1981, 44 y.o.   MRN: [id number]  HPI: Shannon Davidson is a 44 y.o. female  Chief Complaint  Patient presents with   Weight Management Screening   WEIGHT GAIN Patient states she is interested in a GLP1.  Since 2024 she had been gaining weight. She has been working on diet and exercise.   Duration:  Previous attempts at weight loss: yes Complications of obesity:  Peak weight: 185lb Weight loss goal: 160 lb Weight loss to date: 6 months    Patient states she is having more anxiety in the last two days.  She feels like it is more hormonal related.  She is going to start her cycle.  Her HR has jumped up a couple of times.  She does feel like she wants to jump out of her skin.    Relevant past medical, surgical, family and social history reviewed and updated as indicated. Interim medical history since our last visit reviewed. Allergies and medications reviewed and updated.  Review of Systems  Per HPI unless specifically indicated above     Objective:    BP 122/70 (BP Location: Left Arm, Patient Position: Sitting)   Pulse 75   Ht 5' 5 (1.651 m)   Wt 185 lb (83.9 kg)   LMP 06/24/2024   BMI 30.79 kg/m   Wt Readings from Last 3 Encounters:  07/18/24 185 lb (83.9 kg)  07/04/24 180 lb (81.6 kg)  07/02/24 180 lb (81.6 kg)    Physical Exam  Results for orders placed or performed during the hospital encounter of 07/04/24  Urine Culture   Collection Time: 07/04/24  1:35 AM   Specimen: Urine, Catheterized  Result Value Ref Range   Specimen Description      URINE, CATHETERIZED Performed at [facility], [address], [place], [place] [address]    Special Requests      NONE Performed at [facility], [address] [place],  [place] [address]    Culture      NO GROWTH Performed at [facility], [address], [place], [place] [address]    Report Status 07/05/2024 FINAL   Urinalysis, Routine w reflex microscopic -Urine, Clean Catch   Collection Time: 07/04/24  1:35 AM  Result Value Ref Range   Color, Urine AMBER (A) YELLOW   APPearance HAZY (A) CLEAR   Specific Gravity, Urine 1.021 1.005 - 1.030   pH 5.0 5.0 - 8.0   Glucose, UA NEGATIVE NEGATIVE mg/dL   Hgb urine dipstick MODERATE (A) NEGATIVE   Bilirubin Urine NEGATIVE NEGATIVE   Ketones, ur NEGATIVE NEGATIVE mg/dL   Protein, ur 899 (A) NEGATIVE mg/dL   Nitrite POSITIVE (A) NEGATIVE   Leukocytes,Ua NEGATIVE NEGATIVE   RBC / HPF >50 0 - 5 RBC/hpf   WBC, UA >50 0 - 5 WBC/hpf   Bacteria, UA NONE SEEN NONE SEEN   Squamous Epithelial / HPF 6-10 0 - 5 /HPF   Mucus PRESENT   Basic metabolic panel   Collection Time: 07/04/24  1:40 AM  Result Value Ref Range   Sodium 139 135 - 145 mmol/L   Potassium 4.1 3.5 - 5.1 mmol/L  Chloride 104 98 - 111 mmol/L   CO2 24 22 - 32 mmol/L   Glucose, Bld 98 70 - 99 mg/dL   BUN 19 6 - 20 mg/dL   Creatinine, Ser 8.95 (H) 0.44 - 1.00 mg/dL   Calcium 9.0 8.9 - 89.6 mg/dL   GFR, Estimated >39 >39 mL/min   Anion gap 11 5 - 15  CBC   Collection Time: 07/04/24  1:40 AM  Result Value Ref Range   WBC 6.6 4.0 - 10.5 K/uL   RBC 4.03 3.87 - 5.11 MIL/uL   Hemoglobin 12.4 12.0 - 15.0 g/dL   HCT 62.7 63.9 - 53.9 %   MCV 92.3 80.0 - 100.0 fL   MCH 30.8 26.0 - 34.0 pg   MCHC 33.3 30.0 - 36.0 g/dL   RDW 86.3 88.4 - 84.4 %   Platelets 258 150 - 400 K/uL   nRBC 0.0 0.0 - 0.2 %      Assessment & Plan:   Problem List Items Addressed This Visit       Other   Anxiety   Chronic.  Overall controlled with PRN clonazepam . 1 refill lasted about 4-5 months.  Aware of risks of taking Benzodiazepine.  PDMP checked.  Continue with current medication regimen.  Labs ordered today.  Return to clinic in 6 months for reevaluation.  Call  sooner if concerns arise.        Obesity (BMI 30.0-34.9) - Primary   Recommended eating smaller high protein, low fat meals more frequently and exercising 30 mins a day 5 times a week with a goal of 10-15lb weight loss in the next 3 months. Will start Phentermine  15mg .  Side effects and benefits of medication discussed during visit.  Follow up in 1 month.  Call sooner if concerns arise.         Follow up plan: Return in about 1 month (around 08/18/2024) for Weight Managment (virtual).   This visit was completed via MyChart due to the restrictions of the COVID-19 pandemic. All issues as above were discussed and addressed. Physical exam was done as above through visual confirmation on MyChart. If it was felt that the patient should be evaluated in the office, they were directed there. The patient verbally consented to this visit. Location of the patient: Home Location of the provider: Office Those involved with this call:  Provider: Darice Petty, NP CMA: Joya Louder, CMA Front Desk/Registration: Claretta Maiden This encounter was conducted via video.  I spent 30 minutes dedicated to the care of this patient on the date of this encounter to include previsit review of  plan of care, medication options and follow up, face to face time with the patient, and post visit ordering of testing.      "

## 2024-07-31 ENCOUNTER — Ambulatory Visit (INDEPENDENT_AMBULATORY_CARE_PROVIDER_SITE_OTHER): Admitting: Urology

## 2024-07-31 ENCOUNTER — Ambulatory Visit: Admitting: Urology

## 2024-07-31 ENCOUNTER — Encounter: Payer: Self-pay | Admitting: Urology

## 2024-07-31 VITALS — BP 126/82 | HR 90 | Ht 65.0 in | Wt 180.0 lb

## 2024-07-31 DIAGNOSIS — Z09 Encounter for follow-up examination after completed treatment for conditions other than malignant neoplasm: Secondary | ICD-10-CM

## 2024-07-31 DIAGNOSIS — R350 Frequency of micturition: Secondary | ICD-10-CM | POA: Diagnosis not present

## 2024-07-31 DIAGNOSIS — N200 Calculus of kidney: Secondary | ICD-10-CM | POA: Diagnosis not present

## 2024-07-31 DIAGNOSIS — R3914 Feeling of incomplete bladder emptying: Secondary | ICD-10-CM | POA: Diagnosis not present

## 2024-07-31 LAB — BLADDER SCAN AMB NON-IMAGING: Scan Result: 71

## 2024-07-31 NOTE — Progress Notes (Signed)
 "  07/31/2024 9:15 AM   Shannon Davidson Sharps 1980/10/11 [id number]  Referring provider: Melvin Pao, NP [address] [place],  [place] [address]  Chief Complaint  Patient presents with   Urinary Frequency   Urologic history: 1.  Nephrolithiasis Stent placement [facility] 11/22/2018 2-4 mm left proximal ureteral calculus with infection (Klebsiella) Left ureteroscopy with laser lithotripsy left ureteral and renal calculi 12/02/2020 Metabolic evaluation 02/2021: Urine volume 2.43 L; hypercalciuria 297 mg; hyperuricosuria 0.9 mg; supersaturation CaOxDi elevated 7.1 Repeat metabolic evaluation 09/2021: Volume 2.9 L; urine calcium 112; SS caox 3.42, uric acid 0.87, oxalates 48 Left ureteroscopic stone removal 06/29/2024; 6 mm left mid/distal calculus   HPI: Shannon Davidson is a 44 y.o. female presents for postop follow-up  Had ED visits x 3 postop initially for stent pain and subsequently pain after stent removal and sensation of incomplete bladder emptying.  Negative urine cultures x 3.  RUS showed no hydronephrosis and bladder was emptying. Stone analysis mixed calcium oxalate monohydrate/dihydrate: 60/40 Symptoms have resolved and she is doing well  PMH: Past Medical History:  Diagnosis Date   Babesiasis    Bartonella infection    Elevated EBV antibody titer    History of kidney stones    HLD (hyperlipidemia)    Kidney stone    Dr. Twylla   Lyme disease    Palpitations    Pap smear for cervical cancer screening 2012   Dr. Barbette   PCOS (polycystic ovarian syndrome)    Relapsing remitting multiple sclerosis 11/25/2014   Patient says that all her MS symptoms went away in 2017-2018 after she was treated for tick-bourne illnesses    Surgical History: Past Surgical History:  Procedure Laterality Date   APPENDECTOMY  09/2019   CESAREAN SECTION  2008   CYSTOSCOPY/URETEROSCOPY/HOLMIUM LASER/STENT PLACEMENT Left 12/02/2020   Procedure: CYSTOSCOPY/URETEROSCOPY/HOLMIUM LASER/STENT EXCHANGE;   Surgeon: Twylla Shannon BROCKS, MD;  Location: [facility];  Service: Urology;  Laterality: Left;   CYSTOSCOPY/URETEROSCOPY/HOLMIUM LASER/STENT PLACEMENT Left 06/29/2024   Procedure: CYSTOSCOPY/URETEROSCOPY/HOLMIUM LASER/STENT PLACEMENT;  Surgeon: Twylla Shannon BROCKS, MD;  Location: [facility];  Service: Urology;  Laterality: Left;   LITHOTRIPSY     TONSILLECTOMY N/A 05/20/2016   Procedure: TONSILLECTOMY;  Surgeon: Deward Argue, MD;  [facility];  Service: ENT;  Laterality: N/A;   TONSILLECTOMY      Home Medications:  Allergies as of 07/31/2024       Reactions   Egg Protein (egg White) Swelling   Influenza Vaccines Swelling   Metoclopramide  Anxiety        Medication List        Accurate as of July 31, 2024  9:15 AM. If you have any questions, ask your nurse or doctor.          acetaminophen  500 MG tablet Commonly known as: TYLENOL  Take 500 mg by mouth every 6 (six) hours as needed for mild pain (pain score 1-3) or moderate pain (pain score 4-6).   clonazePAM  0.5 MG tablet Commonly known as: KLONOPIN  Take 1 tablet (0.5 mg total) by mouth daily as needed for anxiety.   hydrOXYzine  10 MG tablet Commonly known as: ATARAX  Take 1 tablet (10 mg total) by mouth every 8 (eight) hours as needed.   phentermine  15 MG capsule Take 1 capsule (15 mg total) by mouth every morning.        Allergies: Allergies[1]  Family History: Family History  Problem Relation Age of Onset   Heart disease Maternal Grandmother 65  s/p CABG   Dementia Maternal Grandmother    Hypertension Mother    Seizures Father    Hyperlipidemia Father    Achalasia Sister    COPD Maternal Grandfather    Diabetes Paternal Grandmother     Social History:  reports that she has never smoked. She has never used smokeless tobacco. She reports current alcohol use. She reports that she does not use drugs.   Physical Exam: BP (!) 159/80   Pulse 90   Ht 5' 5 (1.651 m)   Wt 180 lb (81.6 kg)    LMP 06/24/2024   SpO2 98%   BMI 29.95 kg/m   Constitutional:  Alert, No acute distress. HEENT: [place] AT Respiratory: Normal respiratory effort, no increased work of breathing. Psychiatric: Normal mood and affect.   Assessment & Plan:   Doing well status post ureteroscopic stone removal Her last metabolic evaluation remarkable only for hyperoxaluria  She has declined follow-up metabolic evaluation and wants to work on her diet 1 year follow-up with KUB; call earlier for recurrent stone symptoms PVR today 71 mL   Shannon JAYSON Barba, MD  [facility] [address], [address] [place], [place] [address] [phone]     [1]  Allergies Allergen Reactions   Egg Protein (Egg White) Swelling   Influenza Vaccines Swelling   Metoclopramide  Anxiety   "

## 2024-07-31 NOTE — Progress Notes (Signed)
 Patient presents for an office visit. BP today is High. Greater than 140/90. Provider  notified and recheck Blood Pressure back to normal at 126/82.Shannon Davidson

## 2024-09-03 ENCOUNTER — Ambulatory Visit: Admitting: Nurse Practitioner

## 2025-07-31 ENCOUNTER — Ambulatory Visit: Admitting: Urology
